# Patient Record
Sex: Male | Born: 1955 | Race: White | Hispanic: No | Marital: Single | Smoking: Current every day smoker
Health system: Southern US, Community
[De-identification: ages and names within clinical notes are randomized; demographics above are authoritative.]

## PROBLEM LIST (undated history)

## (undated) DIAGNOSIS — I4891 Unspecified atrial fibrillation: Secondary | ICD-10-CM

## (undated) DIAGNOSIS — E876 Hypokalemia: Secondary | ICD-10-CM

## (undated) DIAGNOSIS — I10 Essential (primary) hypertension: Secondary | ICD-10-CM

## (undated) DIAGNOSIS — J449 Chronic obstructive pulmonary disease, unspecified: Secondary | ICD-10-CM

## (undated) DIAGNOSIS — E119 Type 2 diabetes mellitus without complications: Secondary | ICD-10-CM

## (undated) DIAGNOSIS — E871 Hypo-osmolality and hyponatremia: Secondary | ICD-10-CM

## (undated) DIAGNOSIS — I639 Cerebral infarction, unspecified: Secondary | ICD-10-CM

## (undated) HISTORY — PX: TONSILLECTOMY: SUR1361

## (undated) HISTORY — PX: APPENDECTOMY: SHX54

---

## 2017-01-27 ENCOUNTER — Emergency Department (HOSPITAL_COMMUNITY): Payer: Medicare Other

## 2017-01-27 ENCOUNTER — Encounter (HOSPITAL_COMMUNITY): Payer: Self-pay | Admitting: Emergency Medicine

## 2017-01-27 ENCOUNTER — Emergency Department (HOSPITAL_COMMUNITY)
Admission: EM | Admit: 2017-01-27 | Discharge: 2017-01-27 | Disposition: A | Discharge status: Home / Self Care | Payer: Medicare Other | Attending: Emergency Medicine | Admitting: Emergency Medicine

## 2017-01-27 DIAGNOSIS — Y939 Activity, unspecified: Secondary | ICD-10-CM | POA: Diagnosis not present

## 2017-01-27 DIAGNOSIS — Y92038 Other place in apartment as the place of occurrence of the external cause: Secondary | ICD-10-CM | POA: Insufficient documentation

## 2017-01-27 DIAGNOSIS — S42201A Unspecified fracture of upper end of right humerus, initial encounter for closed fracture: Secondary | ICD-10-CM | POA: Insufficient documentation

## 2017-01-27 DIAGNOSIS — T1590XA Foreign body on external eye, part unspecified, unspecified eye, initial encounter: Secondary | ICD-10-CM

## 2017-01-27 DIAGNOSIS — R51 Headache: Secondary | ICD-10-CM | POA: Insufficient documentation

## 2017-01-27 DIAGNOSIS — J449 Chronic obstructive pulmonary disease, unspecified: Secondary | ICD-10-CM | POA: Diagnosis not present

## 2017-01-27 DIAGNOSIS — Z23 Encounter for immunization: Secondary | ICD-10-CM | POA: Diagnosis not present

## 2017-01-27 DIAGNOSIS — I482 Chronic atrial fibrillation, unspecified: Secondary | ICD-10-CM

## 2017-01-27 DIAGNOSIS — F17200 Nicotine dependence, unspecified, uncomplicated: Secondary | ICD-10-CM | POA: Diagnosis not present

## 2017-01-27 DIAGNOSIS — I10 Essential (primary) hypertension: Secondary | ICD-10-CM | POA: Insufficient documentation

## 2017-01-27 DIAGNOSIS — E119 Type 2 diabetes mellitus without complications: Secondary | ICD-10-CM | POA: Diagnosis not present

## 2017-01-27 DIAGNOSIS — S4991XA Unspecified injury of right shoulder and upper arm, initial encounter: Secondary | ICD-10-CM | POA: Diagnosis present

## 2017-01-27 DIAGNOSIS — W010XXA Fall on same level from slipping, tripping and stumbling without subsequent striking against object, initial encounter: Secondary | ICD-10-CM | POA: Insufficient documentation

## 2017-01-27 DIAGNOSIS — Y999 Unspecified external cause status: Secondary | ICD-10-CM | POA: Insufficient documentation

## 2017-01-27 HISTORY — DX: Unspecified atrial fibrillation: I48.91

## 2017-01-27 HISTORY — DX: Essential (primary) hypertension: I10

## 2017-01-27 HISTORY — DX: Type 2 diabetes mellitus without complications: E11.9

## 2017-01-27 HISTORY — DX: Chronic obstructive pulmonary disease, unspecified: J44.9

## 2017-01-27 LAB — CBC WITH DIFFERENTIAL/PLATELET
BASOS ABS: 0 10*3/uL (ref 0.0–0.1)
Basophils Relative: 0 %
Eosinophils Absolute: 0 10*3/uL (ref 0.0–0.7)
Eosinophils Relative: 0 %
HCT: 34.6 % — ABNORMAL LOW (ref 39.0–52.0)
HEMOGLOBIN: 11.2 g/dL — AB (ref 13.0–17.0)
LYMPHS ABS: 1.2 10*3/uL (ref 0.7–4.0)
LYMPHS PCT: 9 %
MCH: 27.9 pg (ref 26.0–34.0)
MCHC: 32.4 g/dL (ref 30.0–36.0)
MCV: 86.3 fL (ref 78.0–100.0)
Monocytes Absolute: 0.6 10*3/uL (ref 0.1–1.0)
Monocytes Relative: 4 %
NEUTROS ABS: 12.2 10*3/uL — AB (ref 1.7–7.7)
NEUTROS PCT: 87 %
Platelets: 228 10*3/uL (ref 150–400)
RBC: 4.01 MIL/uL — AB (ref 4.22–5.81)
RDW: 16.2 % — ABNORMAL HIGH (ref 11.5–15.5)
WBC: 14.1 10*3/uL — AB (ref 4.0–10.5)

## 2017-01-27 LAB — I-STAT CHEM 8, ED
BUN: 10 mg/dL (ref 6–20)
CALCIUM ION: 1.05 mmol/L — AB (ref 1.15–1.40)
Chloride: 96 mmol/L — ABNORMAL LOW (ref 101–111)
Creatinine, Ser: 0.7 mg/dL (ref 0.61–1.24)
GLUCOSE: 119 mg/dL — AB (ref 65–99)
HCT: 37 % — ABNORMAL LOW (ref 39.0–52.0)
Hemoglobin: 12.6 g/dL — ABNORMAL LOW (ref 13.0–17.0)
Potassium: 4 mmol/L (ref 3.5–5.1)
SODIUM: 128 mmol/L — AB (ref 135–145)
TCO2: 21 mmol/L — AB (ref 22–32)

## 2017-01-27 LAB — URINALYSIS, ROUTINE W REFLEX MICROSCOPIC
Bilirubin Urine: NEGATIVE
Glucose, UA: NEGATIVE mg/dL
Hgb urine dipstick: NEGATIVE
Ketones, ur: 20 mg/dL — AB
Leukocytes, UA: NEGATIVE
NITRITE: NEGATIVE
Protein, ur: 30 mg/dL — AB
SPECIFIC GRAVITY, URINE: 1.02 (ref 1.005–1.030)
SQUAMOUS EPITHELIAL / LPF: NONE SEEN
pH: 5 (ref 5.0–8.0)

## 2017-01-27 LAB — ETHANOL: Alcohol, Ethyl (B): <10 mg/dL (ref <10)

## 2017-01-27 LAB — CK: Total CK: 94 U/L (ref 49–397)

## 2017-01-27 MED ORDER — SODIUM CHLORIDE 0.9 % IV BOLUS (SEPSIS)
1000.0000 mL | Freq: Once | INTRAVENOUS | Status: AC
  Administered 2017-01-27: 1000 mL via INTRAVENOUS

## 2017-01-27 MED ORDER — MORPHINE SULFATE (PF) 4 MG/ML IV SOLN
4.0000 mg | Freq: Once | INTRAVENOUS | Status: AC
  Administered 2017-01-27: 4 mg via INTRAVENOUS
  Filled 2017-01-27: qty 1

## 2017-01-27 MED ORDER — TETANUS-DIPHTH-ACELL PERTUSSIS 5-2.5-18.5 LF-MCG/0.5 IM SUSP
0.5000 mL | Freq: Once | INTRAMUSCULAR | Status: AC
  Administered 2017-01-27: 0.5 mL via INTRAMUSCULAR
  Filled 2017-01-27: qty 0.5

## 2017-01-27 NOTE — ED Notes (Signed)
Pt ambulated from room to restroom with no assistance.

## 2017-01-27 NOTE — Discharge Instructions (Signed)
You have been evaluated for your fall.  Your xray revealed that you have broken your right upper arm/shoulder.  Please wear sling and also follow up promptly with orthopedist Dr. Shon Baton next week. You also have a history of having atrial fibrillation.  It is important that you follow up with the afib clinic for further care.  Call and schedule an appoint next week.  Take your home pain medication as needed for pain.  Return if you have any concerns.

## 2017-01-27 NOTE — ED Provider Notes (Signed)
[facility]-EMERGENCY DEPT Provider Note   CSN: [id number] Arrival date & time: 01/27/17  0603     History   Chief Complaint Chief Complaint  Patient presents with  . Fall  . Shoulder Injury    HPI Stuart Mendoza is a 61 y.o. male.  HPI   61 year old male with history of diabetes, COPD, atrial fibrillation, and currently not on any blood thinner medication presenting for evaluation of a fall. Patient states last night he was walking towards his bedroom around midnight go to sleep when he lost balance, fell backward and struck his right shoulder against the wall. He report acute onset of sharp pain to his right shoulder. He fell onto the ground and was unable to get up due to pain. Patient states he may have been on the ground for about an hour. He was found able to get the attention of his roommate who called EMS and patient brought here for further evaluation. Patient currently only complaining of pain to his right shoulder which is nonradiating but worse with movement. He denies any significant headache, neck pain, lightheadedness, dizziness, chest pain, shortness of breath, abdominal pain, low back pain, hip pain or pain to his other extremities. He does suffer a scrape on his right thigh but unable to recall last tetanus status. Per EMS note, patient admits to drinking 240 ounce beers last night and he was laying on the full 1 hour or 2 before EMS was contacted. In the note also mentioned that patient took hydrocodone for the EMS but denies taking any thing. Roommate states that patient has an alcohol problem and falls often. I did specifically ask if patient had consuming alcohol and patient denies. He also denies having recurrent falls and states that his last fall was several years ago. He also mentioned his back alcohol consumption was several days ago. He does have history of atrial fibrillation but currently not taking any blood thinning medication for the past several weeks due to  excessive bruising.  Past Medical History:  Diagnosis Date  . Atrial fibrillation (HCC)   . COPD (chronic obstructive pulmonary disease) (HCC)   . Diabetes mellitus without complication (HCC)   . Hypertension     There are no active problems to display for this patient.   Past Surgical History:  Procedure Laterality Date  . APPENDECTOMY    . TONSILLECTOMY         Home Medications    Prior to Admission medications   Not on File    Family History No family history on file.  Social History Social History  Substance Use Topics  . Smoking status: Current Every Day Smoker  . Smokeless tobacco: Never Used  . Alcohol use Yes     Allergies   Metformin and related   Review of Systems Review of Systems  All other systems reviewed and are negative.    Physical Exam Updated Vital Signs BP (!) 158/113 (BP Location: Left Arm)   Pulse 89   Temp 97.6 F (36.4 C) (Oral)   Resp 20   SpO2 96%   Physical Exam  Constitutional: He is oriented to person, place, and time. He appears well-developed and well-nourished. No distress.  HENT:  Head: Normocephalic and atraumatic.  Scalp is nontender, no hemotympanum, no septal hematoma, no malocclusion, no midface tenderness, no battle sign, no raccoon's eyes  Eyes: Pupils are equal, round, and reactive to light. Conjunctivae and EOM are normal.  Neck: Normal range of motion. Neck supple.  No  cervical midline spine tenderness crepitus or step-off  Cardiovascular:  Irregularly irregular heart rate/rhythm  Pulmonary/Chest: Effort normal and breath sounds normal. No respiratory distress. He has no wheezes.  Abdominal: Soft. Bowel sounds are normal. He exhibits no distension. There is no tenderness.  Musculoskeletal: He exhibits tenderness (Right shoulder: Exquisite tenderness to palpation of the lateral deltoid and the proximal humeral region on palpation with associated swelling and decreased range of motion and crepitus.).    Right elbow was nontender, right wrist nontender with intact radial pulse and normal grip strength.  Neurological: He is alert and oriented to person, place, and time.  Skin: No rash noted.  Psychiatric: He has a normal mood and affect.  Nursing note and vitals reviewed.    ED Treatments / Results  Labs (all labs ordered are listed, but only abnormal results are displayed) Labs Reviewed  URINALYSIS, ROUTINE W REFLEX MICROSCOPIC - Abnormal; Notable for the following:       Result Value   Ketones, ur 20 (*)    Protein, ur 30 (*)    Bacteria, UA RARE (*)    All other components within normal limits  CBC WITH DIFFERENTIAL/PLATELET - Abnormal; Notable for the following:    WBC 14.1 (*)    RBC 4.01 (*)    Hemoglobin 11.2 (*)    HCT 34.6 (*)    RDW 16.2 (*)    Neutro Abs 12.2 (*)    All other components within normal limits  I-STAT CHEM 8, ED - Abnormal; Notable for the following:    Sodium 128 (*)    Chloride 96 (*)    Glucose, Bld 119 (*)    Calcium, Ion 1.05 (*)    TCO2 21 (*)    Hemoglobin 12.6 (*)    HCT 37.0 (*)    All other components within normal limits  CK  ETHANOL    EKG  EKG Interpretation  Date/Time:  Friday January 27 2017 07:48:26 EDT Ventricular Rate:  117 PR Interval:    QRS Duration: 105 QT Interval:  372 QTC Calculation: 471 R Axis:   72 Text Interpretation:  Atrial fibrillation Premature ventricular complexes No old tracing to compare Confirmed by Mancel Bale [phone]) on 01/27/2017 1:42:01 PM       Radiology Dg Shoulder Right  Result Date: 01/27/2017 CLINICAL DATA:  Recent fall with shoulder pain, initial encounter EXAM: RIGHT SHOULDER - 2+ VIEW COMPARISON:  None. FINDINGS: There is a mildly impacted fracture with comminution in the proximal right humerus. It appears to involve primarily the surgical neck. Degenerative changes of the acromioclavicular joint are seen. No other focal abnormality is noted. IMPRESSION: Comminuted proximal right  humeral fracture with impaction at the fracture site. Electronically Signed   By: Alcide Clever M.D.   On: 01/27/2017 07:20   Ct Head Wo Contrast  Result Date: 01/27/2017 CLINICAL DATA:  Intermittent frontal headaches. EXAM: CT HEAD WITHOUT CONTRAST TECHNIQUE: Contiguous axial images were obtained from the base of the skull through the vertex without intravenous contrast. COMPARISON:  None. FINDINGS: Brain: There are bilateral large old frontal infarct with encephalomalacia. Diffuse chronic small vessel disease throughout the deep white matter elsewhere. Mild age related volume loss. No acute intracranial abnormality. Specifically, no hemorrhage, hydrocephalus, mass lesion, acute infarction, or significant intracranial injury. Vascular: No hyperdense vessel or unexpected calcification. Skull: No acute calvarial abnormality. Sinuses/Orbits: Visualized paranasal sinuses and mastoids clear. Orbital soft tissues unremarkable. 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 Atrophy, chronic small vessel  disease. Electronically Signed   By: Charlett Nose M.D.   On: 01/27/2017 10:06   Mr Brain Wo Contrast  Result Date: 01/27/2017 CLINICAL DATA:  Ataxia, suspect stroke EXAM: MRI HEAD WITHOUT CONTRAST TECHNIQUE: Multiplanar, multiecho pulse sequences of the brain and surrounding structures were obtained without intravenous contrast. COMPARISON:  CT 01/27/2017 FINDINGS: Brain: Image quality degraded by moderate motion Negative for acute infarct. Chronic encephalomalacia in the frontal lobe bilaterally is relatively symmetric suggesting prior head trauma. Mild chronic hemorrhage is present in the right frontal lobe. Negative for mass or edema. Enlargement of the frontal horns due to frontal lobe encephalomalacia. Moderate chronic white matter changes. Patchy hyperintensity in the pons. 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 Vascular: Normal arterial flow voids Skull and upper cervical  spine: Negative Sinuses/Orbits: Negative Other: None IMPRESSION: Negative for acute infarct. Chronic microvascular ischemic change in the white matter. 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 Electronically Signed   By: Marlan Palau M.D.   On: 01/27/2017 12:52   Dg Humerus Right  Result Date: 01/27/2017 CLINICAL DATA:  Recent fall with right shoulder pain, initial encounter EXAM: RIGHT HUMERUS - 2+ VIEW COMPARISON:  None. FINDINGS: Comminuted fracture the proximal right humerus is again noted with impaction at the fracture site. The distal humerus and visualized proximal forearm are within normal limits. IMPRESSION: Proximal humeral fracture. Electronically Signed   By: Alcide Clever M.D.   On: 01/27/2017 07:21    Procedures Procedures (including critical care time)  Medications Ordered in ED Medications  Tdap (BOOSTRIX) injection 0.5 mL (0.5 mLs Intramuscular Given 01/27/17 0817)  morphine 4 MG/ML injection 4 mg (4 mg Intravenous Given 01/27/17 0817)  sodium chloride 0.9 % bolus 1,000 mL (1,000 mLs Intravenous New Bag/Given 01/27/17 0817)  morphine 4 MG/ML injection 4 mg (4 mg Intravenous Given 01/27/17 0926)     Initial Impression / Assessment and Plan / ED Course  I have reviewed the triage vital signs and the nursing notes.  Pertinent labs & imaging results that were available during my care of the patient were reviewed by me and considered in my medical decision making (see chart for details).     BP (!) 182/108 (BP Location: Left Arm)   Pulse 90   Temp 97.6 F (36.4 C) (Oral)   Resp 16   SpO2 96%  The patient was noted to be hypertensive today in the emergency department. I have spoken with the patient regarding hypertension and the need for improved management. I instructed the patient to followup with the Primary care doctor within 4 days to improve the management of the patient's hypertension. I also  counseled the patient regarding the signs and symptoms which would require an emergent visit to an emergency department for hypertensive urgency and/or hypertensive emergency. The patient understood the need for improved hypertensive management.   Final Clinical Impressions(s) / ED Diagnoses   Final diagnoses:  Chronic atrial fibrillation (HCC)  Closed fracture of proximal end of right humerus, unspecified fracture morphology, initial encounter    New Prescriptions New Prescriptions   No medications on file   7:42 AM Patient fell last night injuring his right shoulder. X-ray demonstrate comminuted proximal right humeral fracture with impaction at the fracture site. This is a closed injury. Patient is neurovascularly intact. I am unsure if this is a mechanical fall versus having another inciting event however per EMS note, roommate states patient has recurrent falls and history of alcohol  abuse. Workup initiated including assessing EtOH, labs, EKG, orthostatic vital sign. Pain medication, sling and IV fluid given.  This patients CHA2DS2-VASc Score and unadjusted Ischemic Stroke Rate (% per year) is equal to 2.2 % stroke rate/year from a score of 2  Above score calculated as 1 point each if present [CHF, HTN, DM, Vascular=MI/PAD/Aortic Plaque, Age if 65-74, or Male] Above score calculated as 2 points each if present [Age > 75, or Stroke/TIA/TE]   9:15 AM Consider sending to afib clinic for his untreated afib.  1:48 PM CT scan of head and brain MRI without acute pathology. No acute stroke.  WBC mildly elevated at 14.1, likely 2/2 stress.  Hgb 11.2.  Mild hyponatremia of 128, but pt mentating well, no delirium.  Urine with evidence of UTI.  20 ketones were noted in urine, IVF given.  Pt now able to ambulate with no assistance.  Pt will d/c home with close f/u to orthopedic for his right proximal humeral fracture.  Pt will receive opiate pain medication.  In order to decrease risk of narcotic  abuse. Pt's record were checked using the [place] Controlled Substance database.  Pt will also f/u with the afib clinic.  No VTE prophylaxis or oral anticoagulant prescribe at this time as we will defer further care with afib clinic.     Fayrene Helper, PA-C 01/27/17 1402    Mancel Bale, MD 01/30/17 [phone]

## 2017-01-27 NOTE — ED Notes (Signed)
Patient transported to CT 

## 2017-01-27 NOTE — ED Triage Notes (Signed)
Pt brought in from home after he fell forward and caught himself with his right arm   Pt is c/o right upper arm and shoulder pain  Possible deformity  Sling applied by EMS  Pt admits to drinking tonight 2 40 oz beers  Pt states he laid on the floor for an hour or two prior to calling EMS  Pt took a hydrocodone in front of EMS then denied he took anything  Roommate states pt has an alcohol problem and falls often

## 2017-01-27 NOTE — ED Notes (Signed)
Delay on EKG. Just received report and EKG was not complete at the time.

## 2017-01-28 ENCOUNTER — Emergency Department (HOSPITAL_COMMUNITY): Payer: Medicare Other

## 2017-01-28 ENCOUNTER — Encounter (HOSPITAL_COMMUNITY): Payer: Self-pay | Admitting: Oncology

## 2017-01-28 ENCOUNTER — Encounter (HOSPITAL_COMMUNITY): Payer: Self-pay | Admitting: *Deleted

## 2017-01-28 ENCOUNTER — Emergency Department (HOSPITAL_COMMUNITY)
Admission: EM | Admit: 2017-01-28 | Discharge: 2017-01-28 | Disposition: A | Discharge status: Home / Self Care | Payer: Medicare Other | Source: Home / Self Care | Attending: Emergency Medicine | Admitting: Emergency Medicine

## 2017-01-28 ENCOUNTER — Inpatient Hospital Stay (HOSPITAL_COMMUNITY)
Admission: EM | Admit: 2017-01-28 | Discharge: 2017-02-02 | DRG: 897 | Disposition: A | Discharge status: Skilled Nursing Facility | Payer: Medicare Other | Attending: Internal Medicine | Admitting: Internal Medicine

## 2017-01-28 DIAGNOSIS — Y92007 Garden or yard of unspecified non-institutional (private) residence as the place of occurrence of the external cause: Secondary | ICD-10-CM

## 2017-01-28 DIAGNOSIS — Z6834 Body mass index (BMI) 34.0-34.9, adult: Secondary | ICD-10-CM | POA: Diagnosis not present

## 2017-01-28 DIAGNOSIS — I4891 Unspecified atrial fibrillation: Secondary | ICD-10-CM | POA: Diagnosis present

## 2017-01-28 DIAGNOSIS — I482 Chronic atrial fibrillation, unspecified: Secondary | ICD-10-CM

## 2017-01-28 DIAGNOSIS — S42211A Unspecified displaced fracture of surgical neck of right humerus, initial encounter for closed fracture: Secondary | ICD-10-CM | POA: Diagnosis present

## 2017-01-28 DIAGNOSIS — F1593 Other stimulant use, unspecified with withdrawal: Secondary | ICD-10-CM | POA: Diagnosis not present

## 2017-01-28 DIAGNOSIS — T148XXA Other injury of unspecified body region, initial encounter: Secondary | ICD-10-CM

## 2017-01-28 DIAGNOSIS — F10231 Alcohol dependence with withdrawal delirium: Secondary | ICD-10-CM | POA: Diagnosis present

## 2017-01-28 DIAGNOSIS — Z7984 Long term (current) use of oral hypoglycemic drugs: Secondary | ICD-10-CM

## 2017-01-28 DIAGNOSIS — W19XXXA Unspecified fall, initial encounter: Secondary | ICD-10-CM

## 2017-01-28 DIAGNOSIS — I480 Paroxysmal atrial fibrillation: Secondary | ICD-10-CM | POA: Diagnosis not present

## 2017-01-28 DIAGNOSIS — E669 Obesity, unspecified: Secondary | ICD-10-CM | POA: Diagnosis not present

## 2017-01-28 DIAGNOSIS — Z9119 Patient's noncompliance with other medical treatment and regimen: Secondary | ICD-10-CM | POA: Diagnosis not present

## 2017-01-28 DIAGNOSIS — F19939 Other psychoactive substance use, unspecified with withdrawal, unspecified: Secondary | ICD-10-CM | POA: Diagnosis present

## 2017-01-28 DIAGNOSIS — W010XXA Fall on same level from slipping, tripping and stumbling without subsequent striking against object, initial encounter: Secondary | ICD-10-CM | POA: Diagnosis not present

## 2017-01-28 DIAGNOSIS — J449 Chronic obstructive pulmonary disease, unspecified: Secondary | ICD-10-CM | POA: Diagnosis present

## 2017-01-28 DIAGNOSIS — I10 Essential (primary) hypertension: Secondary | ICD-10-CM | POA: Insufficient documentation

## 2017-01-28 DIAGNOSIS — X509XXD Other and unspecified overexertion or strenuous movements or postures, subsequent encounter: Secondary | ICD-10-CM | POA: Insufficient documentation

## 2017-01-28 DIAGNOSIS — F17200 Nicotine dependence, unspecified, uncomplicated: Secondary | ICD-10-CM

## 2017-01-28 DIAGNOSIS — E876 Hypokalemia: Secondary | ICD-10-CM | POA: Diagnosis present

## 2017-01-28 DIAGNOSIS — Y999 Unspecified external cause status: Secondary | ICD-10-CM | POA: Insufficient documentation

## 2017-01-28 DIAGNOSIS — G9341 Metabolic encephalopathy: Secondary | ICD-10-CM | POA: Diagnosis not present

## 2017-01-28 DIAGNOSIS — Z888 Allergy status to other drugs, medicaments and biological substances status: Secondary | ICD-10-CM

## 2017-01-28 DIAGNOSIS — G312 Degeneration of nervous system due to alcohol: Secondary | ICD-10-CM | POA: Diagnosis present

## 2017-01-28 DIAGNOSIS — E871 Hypo-osmolality and hyponatremia: Secondary | ICD-10-CM | POA: Diagnosis not present

## 2017-01-28 DIAGNOSIS — Z79899 Other long term (current) drug therapy: Secondary | ICD-10-CM

## 2017-01-28 DIAGNOSIS — E119 Type 2 diabetes mellitus without complications: Secondary | ICD-10-CM | POA: Insufficient documentation

## 2017-01-28 DIAGNOSIS — S42201D Unspecified fracture of upper end of right humerus, subsequent encounter for fracture with routine healing: Secondary | ICD-10-CM | POA: Insufficient documentation

## 2017-01-28 DIAGNOSIS — E1165 Type 2 diabetes mellitus with hyperglycemia: Secondary | ICD-10-CM | POA: Diagnosis present

## 2017-01-28 DIAGNOSIS — Z7901 Long term (current) use of anticoagulants: Secondary | ICD-10-CM | POA: Insufficient documentation

## 2017-01-28 DIAGNOSIS — F17210 Nicotine dependence, cigarettes, uncomplicated: Secondary | ICD-10-CM | POA: Diagnosis not present

## 2017-01-28 DIAGNOSIS — Y9389 Activity, other specified: Secondary | ICD-10-CM | POA: Insufficient documentation

## 2017-01-28 DIAGNOSIS — Z7982 Long term (current) use of aspirin: Secondary | ICD-10-CM

## 2017-01-28 DIAGNOSIS — S0083XA Contusion of other part of head, initial encounter: Secondary | ICD-10-CM

## 2017-01-28 LAB — CBC WITH DIFFERENTIAL/PLATELET
Basophils Absolute: 0 10*3/uL (ref 0.0–0.1)
Basophils Relative: 0 %
Eosinophils Absolute: 0 10*3/uL (ref 0.0–0.7)
Eosinophils Relative: 0 %
HCT: 34.2 % — ABNORMAL LOW (ref 39.0–52.0)
HEMOGLOBIN: 11 g/dL — AB (ref 13.0–17.0)
LYMPHS ABS: 1.1 10*3/uL (ref 0.7–4.0)
LYMPHS PCT: 9 %
MCH: 27.9 pg (ref 26.0–34.0)
MCHC: 32.2 g/dL (ref 30.0–36.0)
MCV: 86.8 fL (ref 78.0–100.0)
Monocytes Absolute: 0.9 10*3/uL (ref 0.1–1.0)
Monocytes Relative: 7 %
NEUTROS ABS: 10.2 10*3/uL — AB (ref 1.7–7.7)
NEUTROS PCT: 84 %
Platelets: 204 10*3/uL (ref 150–400)
RBC: 3.94 MIL/uL — AB (ref 4.22–5.81)
RDW: 16.6 % — ABNORMAL HIGH (ref 11.5–15.5)
WBC: 12.1 10*3/uL — AB (ref 4.0–10.5)

## 2017-01-28 LAB — PROTIME-INR
INR: 1.21
PROTHROMBIN TIME: 15.2 s (ref 11.4–15.2)

## 2017-01-28 LAB — COMPREHENSIVE METABOLIC PANEL
ALK PHOS: 96 U/L (ref 38–126)
ALT: 14 U/L — AB (ref 17–63)
AST: 20 U/L (ref 15–41)
Albumin: 4.2 g/dL (ref 3.5–5.0)
Anion gap: 12 (ref 5–15)
BUN: 11 mg/dL (ref 6–20)
CALCIUM: 9 mg/dL (ref 8.9–10.3)
CO2: 22 mmol/L (ref 22–32)
CREATININE: 0.78 mg/dL (ref 0.61–1.24)
Chloride: 95 mmol/L — ABNORMAL LOW (ref 101–111)
Glucose, Bld: 124 mg/dL — ABNORMAL HIGH (ref 65–99)
Potassium: 3.7 mmol/L (ref 3.5–5.1)
Sodium: 129 mmol/L — ABNORMAL LOW (ref 135–145)
Total Bilirubin: 1.6 mg/dL — ABNORMAL HIGH (ref 0.3–1.2)
Total Protein: 7.3 g/dL (ref 6.5–8.1)

## 2017-01-28 LAB — ETHANOL: Alcohol, Ethyl (B): <10 mg/dL (ref <10)

## 2017-01-28 MED ORDER — VITAMIN B-1 100 MG PO TABS
100.0000 mg | ORAL_TABLET | Freq: Every day | ORAL | Status: DC
  Administered 2017-01-28: 100 mg via ORAL
  Filled 2017-01-28: qty 1

## 2017-01-28 MED ORDER — ALBUTEROL SULFATE HFA 108 (90 BASE) MCG/ACT IN AERS: 2.0000 | INHALATION_SPRAY | RESPIRATORY_TRACT | Status: DC | PRN

## 2017-01-28 MED ORDER — ALBUTEROL SULFATE (2.5 MG/3ML) 0.083% IN NEBU: 2.5000 mg | INHALATION_SOLUTION | RESPIRATORY_TRACT | Status: DC | PRN

## 2017-01-28 MED ORDER — RIVAROXABAN 20 MG PO TABS
20.0000 mg | ORAL_TABLET | Freq: Every day | ORAL | Status: DC
  Administered 2017-01-28 – 2017-02-02 (×6): 20 mg via ORAL
  Filled 2017-01-28 (×6): qty 1

## 2017-01-28 MED ORDER — FOLIC ACID 1 MG PO TABS
1.0000 mg | ORAL_TABLET | Freq: Every day | ORAL | Status: DC
  Administered 2017-01-28 – 2017-02-02 (×6): 1 mg via ORAL
  Filled 2017-01-28 (×6): qty 1

## 2017-01-28 MED ORDER — ACETAMINOPHEN 325 MG PO TABS
650.0000 mg | ORAL_TABLET | Freq: Four times a day (QID) | ORAL | Status: DC | PRN
  Administered 2017-01-29 – 2017-02-01 (×3): 650 mg via ORAL
  Filled 2017-01-28 (×3): qty 2

## 2017-01-28 MED ORDER — ASPIRIN EC 81 MG PO TBEC
81.0000 mg | DELAYED_RELEASE_TABLET | Freq: Every day | ORAL | Status: DC
  Administered 2017-01-28 – 2017-02-02 (×6): 81 mg via ORAL
  Filled 2017-01-28 (×6): qty 1

## 2017-01-28 MED ORDER — AMLODIPINE BESYLATE 10 MG PO TABS
10.0000 mg | ORAL_TABLET | Freq: Every day | ORAL | Status: DC
  Administered 2017-01-28 – 2017-02-02 (×6): 10 mg via ORAL
  Filled 2017-01-28: qty 2
  Filled 2017-01-28 (×5): qty 1

## 2017-01-28 MED ORDER — LORAZEPAM 2 MG/ML IJ SOLN: 0.0000 mg | Freq: Two times a day (BID) | INTRAMUSCULAR | Status: DC

## 2017-01-28 MED ORDER — THIAMINE HCL 100 MG/ML IJ SOLN: 100.0000 mg | Freq: Once | INTRAMUSCULAR | Status: DC

## 2017-01-28 MED ORDER — LINAGLIPTIN 5 MG PO TABS
5.0000 mg | ORAL_TABLET | Freq: Every day | ORAL | Status: DC
  Administered 2017-01-29 – 2017-02-02 (×5): 5 mg via ORAL
  Filled 2017-01-28 (×5): qty 1

## 2017-01-28 MED ORDER — PANTOPRAZOLE SODIUM 40 MG PO TBEC
40.0000 mg | DELAYED_RELEASE_TABLET | Freq: Every day | ORAL | Status: DC
  Administered 2017-01-28 – 2017-02-02 (×6): 40 mg via ORAL
  Filled 2017-01-28 (×6): qty 1

## 2017-01-28 MED ORDER — VITAMIN B-1 100 MG PO TABS: 100.0000 mg | ORAL_TABLET | Freq: Every day | ORAL | Status: DC

## 2017-01-28 MED ORDER — LORAZEPAM 1 MG PO TABS
0.0000 mg | ORAL_TABLET | Freq: Four times a day (QID) | ORAL | Status: DC
Start: 2017-01-28 — End: 2017-01-28
  Administered 2017-01-28: 1 mg via ORAL
  Filled 2017-01-28: qty 1

## 2017-01-28 MED ORDER — HYDROCODONE-ACETAMINOPHEN 10-325 MG PO TABS: 1.0000 | ORAL_TABLET | Freq: Four times a day (QID) | ORAL | 0 refills | Status: DC | PRN

## 2017-01-28 MED ORDER — SODIUM CHLORIDE 0.9 % IV SOLN
INTRAVENOUS | Status: DC
  Administered 2017-01-28: 22:00:00 via INTRAVENOUS

## 2017-01-28 MED ORDER — ACETAMINOPHEN 650 MG RE SUPP: 650.0000 mg | Freq: Four times a day (QID) | RECTAL | Status: DC | PRN

## 2017-01-28 MED ORDER — FLUTICASONE PROPIONATE 50 MCG/ACT NA SUSP
2.0000 | Freq: Every day | NASAL | Status: DC
  Administered 2017-01-29 – 2017-02-02 (×5): 2 via NASAL
  Filled 2017-01-28: qty 16

## 2017-01-28 MED ORDER — HYDROCODONE-ACETAMINOPHEN 5-325 MG PO TABS
2.0000 | ORAL_TABLET | Freq: Once | ORAL | Status: AC
  Administered 2017-01-28: 2 via ORAL
  Filled 2017-01-28: qty 2

## 2017-01-28 MED ORDER — HYDROCODONE-ACETAMINOPHEN 10-325 MG PO TABS
1.0000 | ORAL_TABLET | Freq: Four times a day (QID) | ORAL | Status: DC | PRN
  Administered 2017-01-29 – 2017-02-02 (×15): 1 via ORAL
  Filled 2017-01-28 (×15): qty 1

## 2017-01-28 MED ORDER — THIAMINE HCL 100 MG/ML IJ SOLN: 100.0000 mg | Freq: Every day | INTRAMUSCULAR | Status: DC

## 2017-01-28 MED ORDER — LABETALOL HCL 100 MG PO TABS
100.0000 mg | ORAL_TABLET | Freq: Two times a day (BID) | ORAL | Status: DC
  Administered 2017-01-29 – 2017-02-02 (×11): 100 mg via ORAL
  Filled 2017-01-28 (×12): qty 1

## 2017-01-28 MED ORDER — LORAZEPAM 2 MG/ML IJ SOLN: 0.0000 mg | Freq: Four times a day (QID) | INTRAMUSCULAR | Status: DC

## 2017-01-28 MED ORDER — ONDANSETRON HCL 4 MG/2ML IJ SOLN: 4.0000 mg | Freq: Four times a day (QID) | INTRAMUSCULAR | Status: DC | PRN

## 2017-01-28 MED ORDER — LORAZEPAM 2 MG/ML IJ SOLN
2.0000 mg | Freq: Once | INTRAMUSCULAR | Status: AC
  Administered 2017-01-28: 2 mg via INTRAVENOUS
  Filled 2017-01-28: qty 1

## 2017-01-28 MED ORDER — LORAZEPAM 1 MG PO TABS
1.0000 mg | ORAL_TABLET | Freq: Four times a day (QID) | ORAL | Status: AC | PRN
  Administered 2017-01-29 – 2017-01-31 (×5): 1 mg via ORAL
  Filled 2017-01-28 (×5): qty 1

## 2017-01-28 MED ORDER — LORAZEPAM 1 MG PO TABS: 0.0000 mg | ORAL_TABLET | Freq: Two times a day (BID) | ORAL | Status: DC

## 2017-01-28 MED ORDER — SODIUM CHLORIDE 0.9 % IV BOLUS (SEPSIS): 1000.0000 mL | Freq: Once | INTRAVENOUS | Status: DC

## 2017-01-28 MED ORDER — TIOTROPIUM BROMIDE MONOHYDRATE 18 MCG IN CAPS
18.0000 ug | ORAL_CAPSULE | Freq: Every day | RESPIRATORY_TRACT | Status: DC
  Administered 2017-01-30 – 2017-02-02 (×4): 18 ug via RESPIRATORY_TRACT
  Filled 2017-01-28 (×2): qty 5

## 2017-01-28 MED ORDER — LORAZEPAM 2 MG/ML IJ SOLN
1.0000 mg | Freq: Four times a day (QID) | INTRAMUSCULAR | Status: AC | PRN
  Administered 2017-01-30: 1 mg via INTRAVENOUS
  Filled 2017-01-28: qty 1

## 2017-01-28 MED ORDER — ADULT MULTIVITAMIN W/MINERALS CH
1.0000 | ORAL_TABLET | Freq: Every day | ORAL | Status: DC
  Administered 2017-01-28 – 2017-02-02 (×6): 1 via ORAL
  Filled 2017-01-28 (×6): qty 1

## 2017-01-28 MED ORDER — ONDANSETRON HCL 4 MG PO TABS: 4.0000 mg | ORAL_TABLET | Freq: Four times a day (QID) | ORAL | Status: DC | PRN

## 2017-01-28 MED ORDER — HYDRALAZINE HCL 20 MG/ML IJ SOLN: 10.0000 mg | Freq: Four times a day (QID) | INTRAMUSCULAR | Status: DC | PRN

## 2017-01-28 MED ORDER — LISINOPRIL 20 MG PO TABS
20.0000 mg | ORAL_TABLET | Freq: Every day | ORAL | Status: DC
  Administered 2017-01-28 – 2017-02-02 (×6): 20 mg via ORAL
  Filled 2017-01-28 (×6): qty 1
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
## 2017-01-28 NOTE — ED Provider Notes (Signed)
[facility]-EMERGENCY DEPT Provider Note   CSN: [id number] Arrival date & time: 01/28/17  0014     History   Chief Complaint Chief Complaint  Patient presents with  . Shoulder Pain    HPI Stuart Mendoza is a 61 y.o. male who presents with right shoulder pain. PMH significant for alocholism, frequent falls, A.fib on Xarelto, HTN, COPD, DM. He originally sustained a closed comminuted proximal humerus fracture yesterday. He had an extensive work up. He states that he was going to take his dog outside and when he opened the door there was another dog that was "6 ft tall" and so he tried to back up and tripped and fell on to his buttocks. He is unsure if he outstretched his hand or not. Since then he has had worsening right shoulder pain. He was on the floor for about 2-3 hours when his roommate came home and called EMS. He endorses drinking two 10oz Bud Ice's tonight. He denies taking any pain medicine. He states he took his sling off because it made his arm hurt more. He denies any head injury, LOC, chest pain, SOB, abdominal pain, other extremity pain. He takes Norco  for pain daily.  HPI  Past Medical History:  Diagnosis Date  . Atrial fibrillation (HCC)   . COPD (chronic obstructive pulmonary disease) (HCC)   . Diabetes mellitus without complication (HCC)   . Hypertension     There are no active problems to display for this patient.   Past Surgical History:  Procedure Laterality Date  . APPENDECTOMY    . TONSILLECTOMY       Home Medications    Prior to Admission medications   Medication Sig Start Date End Date Taking? Authorizing Provider  amLODipine (NORVASC) 10 MG tablet Take 10 mg by mouth daily. 12/22/16   [provider]  aspirin EC 81 MG tablet Take 81 mg by mouth daily.    [provider]  fluticasone (FLONASE) 50 MCG/ACT nasal spray Place 2 sprays into both nostrils daily. 01/17/17   [provider]  HYDROcodone-acetaminophen (NORCO) 10-325  MG tablet Take 1 tablet by mouth 4 (four) times daily. 12/25/16   [provider]  JANUVIA 100 MG tablet Take 100 mg by mouth daily. 12/23/16   [provider]  lisinopril (PRINIVIL,ZESTRIL) 20 MG tablet Take 20 mg by mouth daily. 12/22/16   [provider]  omeprazole (PRILOSEC) 40 MG capsule Take 40 mg by mouth daily. 12/22/16   [provider]  PROAIR HFA 108 (90 Base) MCG/ACT inhaler Place 2 puffs into alternate nostrils every 4 (four) hours as needed for wheezing or shortness of breath. 12/22/16   [provider]  SPIRIVA HANDIHALER 18 MCG inhalation capsule Place 18 mcg into inhaler and inhale daily. 12/23/16   [provider]  XARELTO 20 MG TABS tablet Take 20 mg by mouth daily. 12/26/16   [provider]    Family History No family history on file.  Social History Social History  Substance Use Topics  . Smoking status: Current Every Day Smoker  . Smokeless tobacco: Never Used  . Alcohol use Yes     Allergies   Metformin and related   Review of Systems Review of Systems  Respiratory: Negative for shortness of breath.   Cardiovascular: Negative for chest pain.  Gastrointestinal: Negative for abdominal pain.  Musculoskeletal: Positive for arthralgias and myalgias. Negative for gait problem.  Skin: Negative for wound.  Neurological: Negative for syncope.  Physical Exam Updated Vital Signs BP (!) 168/105 (BP Location: Left Arm)   Pulse 63   Temp (!) 97.3 F (36.3 C) (Oral)   Resp 19   SpO2 96%   Physical Exam  Constitutional: He is oriented to person, place, and time. He appears well-developed and well-nourished. No distress.  Calm, cooperative  HENT:  Head: Normocephalic and atraumatic.  Eyes: Pupils are equal, round, and reactive to light. Conjunctivae are normal. Right eye exhibits no discharge. Left eye exhibits no discharge. No scleral icterus.  Neck: Normal range of motion.  Cardiovascular: Normal  rate.  An irregularly irregular rhythm present.  Pulmonary/Chest: Effort normal and breath sounds normal. No respiratory distress. He has no wheezes. He exhibits no tenderness.  Abdominal: Soft. Bowel sounds are normal. He exhibits no distension. There is no tenderness.  Large abdominal surgical scar  Musculoskeletal:  Right arm: Moderate-severe swelling of shoulder. ROM deferred. Normal ROM of elbow and wrist. 2+ radial pulse.  Neurological: He is alert and oriented to person, place, and time.  Skin: Skin is warm and dry.  Psychiatric: He has a normal mood and affect. His behavior is normal.  Nursing note and vitals reviewed.    ED Treatments / Results  Labs (all labs ordered are listed, but only abnormal results are displayed) Labs Reviewed - No data to display  EKG  EKG Interpretation None       Radiology Dg Shoulder Right  Result Date: 01/28/2017 CLINICAL DATA:  Known right comminuted proximal humerus fracture with impaction sustained several days ago. Patient fell again this evening causing right shoulder to hurt. EXAM: RIGHT SHOULDER - 2+ VIEW COMPARISON:  Right shoulder and humerus radiographs from 1 day prior. FINDINGS: Re- demonstration of a comminuted impacted surgical neck fracture of the humerus with comminution. Slightly more apparent fracture undermining the greater tuberosity is seen on current exam fell also present in retrospect from the prior humerus radiographs. No joint dislocation. AC joint is maintained. The adjacent ribs and lung are nonacute. IMPRESSION: Comminuted surgical neck fracture with comminution, stable in appearance. Fracture undermining the greater tuberosity is again seen. No new fracture or joint dislocations. Electronically Signed   By: Tollie Eth M.D.   On: 01/28/2017 01:32   Dg Shoulder Right  Result Date: 01/27/2017 CLINICAL DATA:  Recent fall with shoulder pain, initial encounter EXAM: RIGHT SHOULDER - 2+ VIEW COMPARISON:  None. FINDINGS:  There is a mildly impacted fracture with comminution in the proximal right humerus. It appears to involve primarily the surgical neck. Degenerative changes of the acromioclavicular joint are seen. No other focal abnormality is noted. IMPRESSION: Comminuted proximal right humeral fracture with impaction at the fracture site. Electronically Signed   By: Alcide Clever M.D.   On: 01/27/2017 07:20   Ct Head Wo Contrast  Result Date: 01/27/2017 CLINICAL DATA:  Intermittent frontal headaches. EXAM: CT HEAD WITHOUT CONTRAST TECHNIQUE: Contiguous axial images were obtained from the base of the skull through the vertex without intravenous contrast. COMPARISON:  None. FINDINGS: Brain: There are bilateral large old frontal infarct with encephalomalacia. Diffuse chronic small vessel disease throughout the deep white matter elsewhere. Mild age related volume loss. No acute intracranial abnormality. Specifically, no hemorrhage, hydrocephalus, mass lesion, acute infarction, or significant intracranial injury. Vascular: No hyperdense vessel or unexpected calcification. Skull: No acute calvarial abnormality. Sinuses/Orbits: Visualized paranasal sinuses and mastoids clear. Orbital soft tissues unremarkable. 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 Atrophy, chronic small vessel disease. Electronically Signed  By: Charlett Nose M.D.   On: 01/27/2017 10:06   Mr Brain Wo Contrast  Result Date: 01/27/2017 CLINICAL DATA:  Ataxia, suspect stroke EXAM: MRI HEAD WITHOUT CONTRAST TECHNIQUE: Multiplanar, multiecho pulse sequences of the brain and surrounding structures were obtained without intravenous contrast. COMPARISON:  CT 01/27/2017 FINDINGS: Brain: Image quality degraded by moderate motion Negative for acute infarct. Chronic encephalomalacia in the frontal lobe bilaterally is relatively symmetric suggesting prior head trauma. Mild chronic hemorrhage is present in the right frontal lobe. Negative  for mass or edema. Enlargement of the frontal horns due to frontal lobe encephalomalacia. Moderate chronic white matter changes. Patchy hyperintensity in the pons. 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 Vascular: Normal arterial flow voids Skull and upper cervical spine: Negative Sinuses/Orbits: Negative Other: None IMPRESSION: Negative for acute infarct. Chronic microvascular ischemic change in the white matter. 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 Electronically Signed   By: Marlan Palau M.D.   On: 01/27/2017 12:52   Dg Humerus Right  Result Date: 01/27/2017 CLINICAL DATA:  Recent fall with right shoulder pain, initial encounter EXAM: RIGHT HUMERUS - 2+ VIEW COMPARISON:  None. FINDINGS: Comminuted fracture the proximal right humerus is again noted with impaction at the fracture site. The distal humerus and visualized proximal forearm are within normal limits. IMPRESSION: Proximal humeral fracture. Electronically Signed   By: Alcide Clever M.D.   On: 01/27/2017 07:21    Procedures Procedures (including critical care time)  Medications Ordered in ED Medications  HYDROcodone-acetaminophen (NORCO/VICODIN) 5-325 MG per tablet 2 tablet (2 tablets Oral Given 01/28/17 0106)     Initial Impression / Assessment and Plan / ED Course  I have reviewed the triage vital signs and the nursing notes.  Pertinent labs & imaging results that were available during my care of the patient were reviewed by me and considered in my medical decision making (see chart for details).  61 year old male with worsening right shoulder pain s/p fall earlier this evening. He is hypertensive but otherwise vitals are normal. Repeat Xray tonight did not show any acute abnormality. He has a hx of frequent falls which is likely due to his alcoholism and pain med use. He also seems relatively non-compliant. He took the sling off because it hurt more.  Also unclear if he was discharged with any pain medicine. [place] database checked which did not show a recent rx for Norco. Although this may increase his risk for more falls, he also has a significant fracture. Will rx small amt of Norco and encouraged pt to follow up with Orthopedics and PCP regarding his A.fib and uncontrolled BP.   I reviewed xray result with him. He states he doesn't want to go because he's "not stabilized". I advised him to follow up with Ortho and his PCP. I also advised him to wear his arm sling. He is at high risk for return.  Final Clinical Impressions(s) / ED Diagnoses   Final diagnoses:  Closed traumatic nondisplaced fracture of proximal end of right humerus with routine healing, subsequent encounter  Fall, initial encounter  Hypertension, unspecified type  Chronic atrial fibrillation [facility])    New Prescriptions New Prescriptions   No medications on file     Beryle Quant 01/28/17 0710    Loren Racer, MD 01/30/17 [phone]

## 2017-01-28 NOTE — ED Notes (Signed)
2 failed attempts to collect urine.

## 2017-01-28 NOTE — Discharge Instructions (Addendum)
Please follow up with Orthopedics and your doctor Wear sling at all times except when showering Take pain medicine as prescribed
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
## 2017-01-28 NOTE — ED Notes (Signed)
Pt states he doesn't want tot go home, he thinks he left too early on Monday
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
## 2017-01-28 NOTE — ED Notes (Signed)
Bed: [facility] Expected date:  Expected time:  Means of arrival:  Comments: Fall shoulder injury

## 2017-01-28 NOTE — ED Notes (Signed)
3rd failed attempt to collect urine sample

## 2017-01-29 DIAGNOSIS — G9341 Metabolic encephalopathy: Secondary | ICD-10-CM | POA: Diagnosis not present

## 2017-01-29 DIAGNOSIS — F10231 Alcohol dependence with withdrawal delirium: Secondary | ICD-10-CM | POA: Diagnosis not present

## 2017-01-29 LAB — CBC
HCT: 29.4 % — ABNORMAL LOW (ref 39.0–52.0)
Hemoglobin: 9.5 g/dL — ABNORMAL LOW (ref 13.0–17.0)
MCH: 28 pg (ref 26.0–34.0)
MCHC: 32.3 g/dL (ref 30.0–36.0)
MCV: 86.7 fL (ref 78.0–100.0)
PLATELETS: 190 10*3/uL (ref 150–400)
RBC: 3.39 MIL/uL — AB (ref 4.22–5.81)
RDW: 16.7 % — ABNORMAL HIGH (ref 11.5–15.5)
WBC: 9.1 10*3/uL (ref 4.0–10.5)

## 2017-01-29 LAB — BASIC METABOLIC PANEL
ANION GAP: 11 (ref 5–15)
BUN: 7 mg/dL (ref 6–20)
CALCIUM: 8.7 mg/dL — AB (ref 8.9–10.3)
CHLORIDE: 96 mmol/L — AB (ref 101–111)
CO2: 25 mmol/L (ref 22–32)
CREATININE: 0.66 mg/dL (ref 0.61–1.24)
GFR calc non Af Amer: >60 mL/min (ref >60)
GLUCOSE: 106 mg/dL — AB (ref 65–99)
Potassium: 3.1 mmol/L — ABNORMAL LOW (ref 3.5–5.1)
Sodium: 132 mmol/L — ABNORMAL LOW (ref 135–145)

## 2017-01-29 LAB — GLUCOSE, CAPILLARY: GLUCOSE-CAPILLARY: 114 mg/dL — AB (ref 65–99)

## 2017-01-29 LAB — RAPID URINE DRUG SCREEN, HOSP PERFORMED
AMPHETAMINES: NOT DETECTED
Barbiturates: NOT DETECTED
Benzodiazepines: POSITIVE — AB
Cocaine: NOT DETECTED
OPIATES: POSITIVE — AB
TETRAHYDROCANNABINOL: NOT DETECTED

## 2017-01-29 LAB — TSH: TSH: 0.652 u[IU]/mL (ref 0.350–4.500)

## 2017-01-29 LAB — HIV ANTIBODY (ROUTINE TESTING W REFLEX): HIV Screen 4th Generation wRfx: NONREACTIVE

## 2017-01-29 MED ORDER — POTASSIUM CHLORIDE CRYS ER 20 MEQ PO TBCR
40.0000 meq | EXTENDED_RELEASE_TABLET | Freq: Once | ORAL | Status: AC
  Administered 2017-01-29: 40 meq via ORAL
  Filled 2017-01-29: qty 2

## 2017-01-29 NOTE — Progress Notes (Addendum)
PT Cancellation Note  Patient Details Name: Stuart Mendoza MRN: [id number] DOB: 1956/02/10   Cancelled Treatment:    Reason Eval/Treat Not Completed: Patient declined, no reason specified. Pt stated he wanted to rest.  He adamantly refused.  Sling not on arm and he refused to allow PT to don this. Spoke with nursing and will check back later as schedule permits. Addendum: Returned 2 hours later and pt refused again stating, "It's the weekend.  I need to rest."  Will check back tomorrow.  [name] 01/29/2017, 1:21 PM

## 2017-01-29 NOTE — Progress Notes (Signed)
PROGRESS NOTE    Stuart Mendoza  ZOX:[id number] DOB: 1956-03-03 DOA: 01/28/2017 PCP: Patient, No Pcp Per    Brief Narrative:  61 y.o. male with medical history significant for alcohol abuse, hypertension, atrial fibrillation on xarelto. Due to altered mental status he is not a good historian. Per ED MD, pt came to ED 9/28 after he fell walking his dog, he was found to have a humerus fracture and he was discharged and his arm was placed in a sling. He apparently drank alcohol at home and had sustained another fall and came to ED for evaluation.   Assessment & Plan:   Principal Problem:   Acute metabolic encephalopathy - secondary to alcohol withdrawal  Atrial fibrillation - Chads score > 2 on xarelto - continue B blocker for rate control  Hyperglycemia/diabetes - Change diet to diabetic diet  Hypokalemia - will replace orally and reassess next am.  COPD - Currently compensated and stable continue current medication regimen  Active Problems:   Hyponatremia - mild and improving. Suspect continued improvement throughout the course of the day with improvement in oral intake    Accelerated hypertension - Patient is on amlodipine, labetalol, lisinopril   DVT prophylaxis: pt is on xarelto Code Status: Full Family Communication: None at bedside.  Disposition Plan: pending improvement in condition   Consultants:   None   Procedures: none   Antimicrobials: None   Subjective: Pt has no new complaints, no acute issues overnight. Pleasantly confused  Objective: Vitals:   01/28/17 2047 01/28/17 2121 01/29/17 0001 01/29/17 0611  BP:   (!) 159/104 (!) 137/94  Pulse: 68 94 100 (!) 113  Resp: Temp:  97.8 F (36.6 C) 98.5 F (36.9 C) 98.6 F (37 C)  TempSrc:  Axillary Oral Oral  SpO2: 98% 95% 96% 96%  Weight:    100.3 kg (221 lb 3.2 oz)  Height:        Intake/Output Summary (Last 24 hours) at 01/29/17 1031 Last data filed at 01/29/17 0618  Gross  per 24 hour  Intake                0 ml  Output              725 ml  Net             -725 ml   Filed Weights   01/28/17 0628 01/29/17 0611  Weight: 96.2 kg (212 lb) 100.3 kg (221 lb 3.2 oz)    Examination:  General exam: Pt in nad, alert and awake Respiratory system: Clear to auscultation. Respiratory effort normal. Cardiovascular system: S1 & S2 heard, RRR. No JVD, murmurs, rubs, gallops or clicks. No pedal edema. Gastrointestinal system: Abdomen is nondistended, soft and nontender. No organomegaly or masses felt. Normal bowel sounds heard. Central nervous system: Alert and oriented x2 to person, time but not place. No focal neurological deficits. Extremities: Symmetric 5 x 5 power. Skin: No rashes,or ulcers, has bruising on face near right eyebrow Psychiatry: Judgement and insight appear impaired. Mood & affect appropriate.     Data Reviewed: I have personally reviewed following labs and imaging studies  CBC:  Recent Labs Lab 01/27/17 0827 01/27/17 0840 01/28/17 0856 01/29/17 0703  WBC 14.1*  --  12.1* 9.1  NEUTROABS 12.2*  --  10.2*  --   HGB 11.2* 12.6* 11.0* 9.5*  HCT 34.6* 37.0* 34.2* 29.4*  MCV 86.3  --  86.8 86.7  PLT 228  --  204 190   Basic Metabolic Panel:  Recent Labs Lab 01/27/17 0840 01/28/17 0856 01/29/17 0703  NA 128* 129* 132*  K 4.0 3.7 3.1*  CL 96* 95* 96*  CO2  --  22 25  GLUCOSE 119* 124* 106*  BUN CREATININE 0.70 0.78 0.66  CALCIUM  --  9.0 8.7*   GFR: Estimated Creatinine Clearance: 113.6 mL/min (by C-G formula based on SCr of 0.66 mg/dL). Liver Function Tests:  Recent Labs Lab 01/28/17 0856  AST 20  ALT 14*  ALKPHOS 96  BILITOT 1.6*  PROT 7.3  ALBUMIN 4.2   No results for input(s): LIPASE, AMYLASE in the last 168 hours. No results for input(s): AMMONIA in the last 168 hours. Coagulation Profile:  Recent Labs Lab 01/28/17 0856  INR 1.21   Cardiac Enzymes:  Recent Labs Lab 01/27/17 0827  CKTOTAL 94    BNP (last 3 results) No results for input(s): PROBNP in the last 8760 hours. HbA1C: No results for input(s): HGBA1C in the last 72 hours. CBG:  Recent Labs Lab 01/29/17 0748  GLUCAP 114*   Lipid Profile: No results for input(s): CHOL, HDL, LDLCALC, TRIG, CHOLHDL, LDLDIRECT in the last 72 hours. Thyroid Function Tests:  Recent Labs  01/29/17 0703  TSH 0.652   Anemia Panel: No results for input(s): VITAMINB12, FOLATE, FERRITIN, TIBC, IRON, RETICCTPCT in the last 72 hours. Sepsis Labs: No results for input(s): PROCALCITON, LATICACIDVEN in the last 168 hours.  No results found for this or any previous visit (from the past 240 hour(s)).       Radiology Studies: Dg Shoulder Right  Result Date: 01/28/2017 CLINICAL DATA:  Known right comminuted proximal humerus fracture with impaction sustained several days ago. Patient fell again this evening causing right shoulder to hurt. EXAM: RIGHT SHOULDER - 2+ VIEW COMPARISON:  Right shoulder and humerus radiographs from 1 day prior. FINDINGS: Re- demonstration of a comminuted impacted surgical neck fracture of the humerus with comminution. Slightly more apparent fracture undermining the greater tuberosity is seen on current exam fell also present in retrospect from the prior humerus radiographs. No joint dislocation. AC joint is maintained. The adjacent ribs and lung are nonacute. IMPRESSION: Comminuted surgical neck fracture with comminution, stable in appearance. Fracture undermining the greater tuberosity is again seen. No new fracture or joint dislocations. Electronically Signed   By: Tollie Eth M.D.   On: 01/28/2017 01:32   Ct Head Wo Contrast  Result Date: 01/28/2017 CLINICAL DATA:  61 year old male with fall and injury. Frontal headache. EXAM: CT HEAD WITHOUT CONTRAST TECHNIQUE: Contiguous axial images were obtained from the base of the skull through the vertex without intravenous contrast. COMPARISON:  01/27/2017 CT and MR FINDINGS:  Brain: No evidence of acute infarction, hemorrhage, hydrocephalus, extra-axial collection or mass lesion/mass effect. Moderate bifrontal encephalomalacia and chronic small-vessel white matter ischemic changes again noted. Vascular: Intracranial atherosclerotic calcifications again noted. Skull: No acute abnormality Sinuses/Orbits: No acute abnormality Other: None IMPRESSION: 1. No evidence of acute intracranial abnormality 2. Moderate bifrontal encephalomalacia Electronically Signed   By: Harmon Pier M.D.   On: 01/28/2017 09:29   Mr Brain Wo Contrast  Result Date: 01/27/2017 CLINICAL DATA:  Ataxia, suspect stroke EXAM: MRI HEAD WITHOUT CONTRAST TECHNIQUE: Multiplanar, multiecho pulse sequences of the brain and surrounding structures were obtained without intravenous contrast. COMPARISON:  CT 01/27/2017 FINDINGS: Brain: Image quality degraded by moderate motion Negative for acute infarct. Chronic encephalomalacia in the frontal lobe bilaterally is relatively symmetric suggesting prior  head trauma. Mild chronic hemorrhage is present in the right frontal lobe. Negative for mass or edema. Enlargement of the frontal horns due to frontal lobe encephalomalacia. Moderate chronic white matter changes. Patchy hyperintensity in the pons. 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 Vascular: Normal arterial flow voids Skull and upper cervical spine: Negative Sinuses/Orbits: Negative Other: None IMPRESSION: Negative for acute infarct. Chronic microvascular ischemic change in the white matter. 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 Electronically Signed   By: Marlan Palau M.D.   On: 01/27/2017 12:52        Scheduled Meds: . amLODipine  10 mg Oral Daily  . aspirin EC  81 mg Oral Daily  . fluticasone  2 spray Each Nare Daily  . folic acid  1 mg Oral Daily  . labetalol  100 mg Oral BID  . linagliptin  5 mg Oral Daily  . lisinopril  20 mg  Oral Daily  . multivitamin with minerals  1 tablet Oral Daily  . pantoprazole  40 mg Oral Daily  . rivaroxaban  20 mg Oral Daily  . tiotropium  18 mcg Inhalation Daily   Continuous Infusions: . sodium chloride 75 mL/hr at 01/28/17 2145     LOS: 1 day    Time spent: > 35 minutes  Penny Pia, MD [facility] Pager [phone]  If 7PM-7AM, please contact night-coverage [email or web address] Password [email or web address] 01/29/2017, 10:31 AM

## 2017-01-29 NOTE — Progress Notes (Signed)
This shift pt arrived to unit room 1518 via stretcher. Walked to bed w/ unsteady gait and 2 assist. VS taken. Alert to self. callbell within reach, bed alarm on. Pt lethargic. Initial assessment completed. Will continue to monitor pt

## 2017-01-30 DIAGNOSIS — S42211A Unspecified displaced fracture of surgical neck of right humerus, initial encounter for closed fracture: Secondary | ICD-10-CM | POA: Diagnosis not present

## 2017-01-30 DIAGNOSIS — G9341 Metabolic encephalopathy: Secondary | ICD-10-CM | POA: Diagnosis not present

## 2017-01-30 DIAGNOSIS — Z9119 Patient's noncompliance with other medical treatment and regimen: Secondary | ICD-10-CM | POA: Diagnosis not present

## 2017-01-30 DIAGNOSIS — Z7982 Long term (current) use of aspirin: Secondary | ICD-10-CM | POA: Diagnosis not present

## 2017-01-30 DIAGNOSIS — W010XXA Fall on same level from slipping, tripping and stumbling without subsequent striking against object, initial encounter: Secondary | ICD-10-CM | POA: Diagnosis not present

## 2017-01-30 DIAGNOSIS — I480 Paroxysmal atrial fibrillation: Secondary | ICD-10-CM | POA: Diagnosis not present

## 2017-01-30 DIAGNOSIS — E1165 Type 2 diabetes mellitus with hyperglycemia: Secondary | ICD-10-CM | POA: Diagnosis not present

## 2017-01-30 DIAGNOSIS — E876 Hypokalemia: Secondary | ICD-10-CM | POA: Diagnosis not present

## 2017-01-30 DIAGNOSIS — Z888 Allergy status to other drugs, medicaments and biological substances status: Secondary | ICD-10-CM | POA: Diagnosis not present

## 2017-01-30 DIAGNOSIS — W19XXXA Unspecified fall, initial encounter: Secondary | ICD-10-CM | POA: Diagnosis not present

## 2017-01-30 DIAGNOSIS — Z79899 Other long term (current) drug therapy: Secondary | ICD-10-CM | POA: Diagnosis not present

## 2017-01-30 DIAGNOSIS — Z7984 Long term (current) use of oral hypoglycemic drugs: Secondary | ICD-10-CM | POA: Diagnosis not present

## 2017-01-30 DIAGNOSIS — G312 Degeneration of nervous system due to alcohol: Secondary | ICD-10-CM | POA: Diagnosis not present

## 2017-01-30 DIAGNOSIS — J449 Chronic obstructive pulmonary disease, unspecified: Secondary | ICD-10-CM | POA: Diagnosis not present

## 2017-01-30 DIAGNOSIS — E871 Hypo-osmolality and hyponatremia: Secondary | ICD-10-CM | POA: Diagnosis not present

## 2017-01-30 DIAGNOSIS — F10231 Alcohol dependence with withdrawal delirium: Secondary | ICD-10-CM | POA: Diagnosis present

## 2017-01-30 DIAGNOSIS — Z7901 Long term (current) use of anticoagulants: Secondary | ICD-10-CM | POA: Diagnosis not present

## 2017-01-30 DIAGNOSIS — E669 Obesity, unspecified: Secondary | ICD-10-CM | POA: Diagnosis not present

## 2017-01-30 DIAGNOSIS — F17210 Nicotine dependence, cigarettes, uncomplicated: Secondary | ICD-10-CM | POA: Diagnosis not present

## 2017-01-30 DIAGNOSIS — Z6834 Body mass index (BMI) 34.0-34.9, adult: Secondary | ICD-10-CM | POA: Diagnosis not present

## 2017-01-30 DIAGNOSIS — I10 Essential (primary) hypertension: Secondary | ICD-10-CM | POA: Diagnosis not present

## 2017-01-30 LAB — BASIC METABOLIC PANEL
ANION GAP: 11 (ref 5–15)
BUN: 13 mg/dL (ref 6–20)
CHLORIDE: 99 mmol/L — AB (ref 101–111)
CO2: 22 mmol/L (ref 22–32)
Calcium: 8.6 mg/dL — ABNORMAL LOW (ref 8.9–10.3)
Creatinine, Ser: 0.75 mg/dL (ref 0.61–1.24)
GFR calc non Af Amer: >60 mL/min (ref >60)
Glucose, Bld: 139 mg/dL — ABNORMAL HIGH (ref 65–99)
POTASSIUM: 4.8 mmol/L (ref 3.5–5.1)
Sodium: 132 mmol/L — ABNORMAL LOW (ref 135–145)

## 2017-01-30 LAB — GLUCOSE, CAPILLARY: Glucose-Capillary: 137 mg/dL — ABNORMAL HIGH (ref 65–99)

## 2017-01-30 MED ORDER — CHLORHEXIDINE GLUCONATE 0.12 % MT SOLN
15.0000 mL | Freq: Two times a day (BID) | OROMUCOSAL | Status: DC
  Administered 2017-01-31 – 2017-02-02 (×6): 15 mL via OROMUCOSAL
  Filled 2017-01-30 (×4): qty 15

## 2017-01-30 MED ORDER — ORAL CARE MOUTH RINSE
15.0000 mL | Freq: Two times a day (BID) | OROMUCOSAL | Status: DC
  Administered 2017-02-01 – 2017-02-02 (×4): 15 mL via OROMUCOSAL

## 2017-01-30 MED ORDER — NICOTINE 21 MG/24HR TD PT24: 21.0000 mg | MEDICATED_PATCH | Freq: Every day | TRANSDERMAL | Status: DC

## 2017-01-30 MED ORDER — NICOTINE 14 MG/24HR TD PT24
14.0000 mg | MEDICATED_PATCH | Freq: Every day | TRANSDERMAL | Status: DC
  Administered 2017-01-30 – 2017-02-02 (×5): 14 mg via TRANSDERMAL
  Filled 2017-01-30 (×5): qty 1

## 2017-01-30 NOTE — Progress Notes (Signed)
Date:  January 30, 2017 Chart reviewed for concurrent status and case management needs.  Will continue to follow patient progress.  Discharge Planning: following for needs  Expected discharge date: [id number]  Rhonda Davis, BSN, RN3, CCM   [phone]  

## 2017-01-30 NOTE — Progress Notes (Signed)
PROGRESS NOTE    Stuart Mendoza  UEA:[id number] DOB: 1956-04-11 DOA: 01/28/2017 PCP: Patient, No Pcp Per    Brief Narrative:  61 y.o. male with medical history significant for alcohol abuse, hypertension, atrial fibrillation on xarelto. Due to altered mental status he is not a good historian. Per ED MD, pt came to ED 9/28 after he fell walking his dog, he was found to have a humerus fracture and he was discharged and his arm was placed in a sling. He apparently drank alcohol at home and had sustained another fall and came to ED for evaluation.   Assessment & Plan:   Principal Problem:   Acute metabolic encephalopathy - secondary to alcohol withdrawal  Atrial fibrillation - Chads score > 2 on xarelto - continue B blocker for rate control  Hyperglycemia/diabetes - Change diet to diabetic diet  Hypokalemia - resolved - d/c telemetry  COPD - Currently compensated and stable continue current medication regimen  Active Problems:   Hyponatremia - mild and stable. 132 on last check.    Accelerated hypertension - Patient is on amlodipine, labetalol, lisinopril   DVT prophylaxis: pt is on xarelto Code Status: Full Family Communication: None at bedside.  Disposition Plan: pending improvement in condition   Consultants:   None   Procedures: none   Antimicrobials: None   Subjective: Pt is still confused.   Objective: Vitals:   01/29/17 1310 01/29/17 2121 01/30/17 0041 01/30/17 0520  BP: (!) 142/80 (!) 133/94 (!) 147/95 (!) 150/75  Pulse: (!) 108 63 (!) 107 86  Resp: Temp: 98.9 F (37.2 C) 97.8 F (36.6 C) 97.8 F (36.6 C) 97.9 F (36.6 C)  TempSrc: Oral Oral Oral Oral  SpO2: 98% 98% 98% 97%  Weight:    101.7 kg (224 lb 1.6 oz)  Height:        Intake/Output Summary (Last 24 hours) at 01/30/17 1001 Last data filed at 01/30/17 0522  Gross per 24 hour  Intake             1380 ml  Output             1100 ml  Net              280 ml    Filed Weights   01/28/17 0628 01/29/17 0611 01/30/17 0520  Weight: 96.2 kg (212 lb) 100.3 kg (221 lb 3.2 oz) 101.7 kg (224 lb 1.6 oz)    Examination: Exam unchanged when compared to prior on 01/19/17  General exam: Pt in nad, alert and awake Respiratory system: Clear to auscultation. Respiratory effort normal. Cardiovascular system: S1 & S2 heard, RRR. No JVD, murmurs, rubs, gallops or clicks. No pedal edema. Gastrointestinal system: Abdomen is nondistended, soft and nontender. No organomegaly or masses felt. Normal bowel sounds heard. Central nervous system: Alert and oriented x2 to person, time but not place. No focal neurological deficits. Extremities: Symmetric 5 x 5 power. Skin: No rashes,or ulcers, has bruising on face near right eyebrow Psychiatry: Judgement and insight appear impaired. Mood & affect appropriate.     Data Reviewed: I have personally reviewed following labs and imaging studies  CBC:  Recent Labs Lab 01/27/17 0827 01/27/17 0840 01/28/17 0856 01/29/17 0703  WBC 14.1*  --  12.1* 9.1  NEUTROABS 12.2*  --  10.2*  --   HGB 11.2* 12.6* 11.0* 9.5*  HCT 34.6* 37.0* 34.2* 29.4*  MCV 86.3  --  86.8 86.7  PLT 228  --  204 190   Basic Metabolic Panel:  Recent Labs Lab 01/27/17 0840 01/28/17 0856 01/29/17 0703 01/30/17 0621  NA 128* 129* 132* 132*  K 4.0 3.7 3.1* 4.8  CL 96* 95* 96* 99*  CO2  --  GLUCOSE 119* 124* 106* 139*  BUN CREATININE 0.70 0.78 0.66 0.75  CALCIUM  --  9.0 8.7* 8.6*   GFR: Estimated Creatinine Clearance: 114.4 mL/min (by C-G formula based on SCr of 0.75 mg/dL). Liver Function Tests:  Recent Labs Lab 01/28/17 0856  AST 20  ALT 14*  ALKPHOS 96  BILITOT 1.6*  PROT 7.3  ALBUMIN 4.2   No results for input(s): LIPASE, AMYLASE in the last 168 hours. No results for input(s): AMMONIA in the last 168 hours. Coagulation Profile:  Recent Labs Lab 01/28/17 0856  INR 1.21   Cardiac Enzymes:  Recent  Labs Lab 01/27/17 0827  CKTOTAL 94   BNP (last 3 results) No results for input(s): PROBNP in the last 8760 hours. HbA1C: No results for input(s): HGBA1C in the last 72 hours. CBG:  Recent Labs Lab 01/29/17 0748 01/30/17 0738  GLUCAP 114* 137*   Lipid Profile: No results for input(s): CHOL, HDL, LDLCALC, TRIG, CHOLHDL, LDLDIRECT in the last 72 hours. Thyroid Function Tests:  Recent Labs  01/29/17 0703  TSH 0.652   Anemia Panel: No results for input(s): VITAMINB12, FOLATE, FERRITIN, TIBC, IRON, RETICCTPCT in the last 72 hours. Sepsis Labs: No results for input(s): PROCALCITON, LATICACIDVEN in the last 168 hours.  No results found for this or any previous visit (from the past 240 hour(s)).   Radiology Studies: No results found.  Scheduled Meds: . amLODipine  10 mg Oral Daily  . aspirin EC  81 mg Oral Daily  . fluticasone  2 spray Each Nare Daily  . folic acid  1 mg Oral Daily  . labetalol  100 mg Oral BID  . linagliptin  5 mg Oral Daily  . lisinopril  20 mg Oral Daily  . multivitamin with minerals  1 tablet Oral Daily  . nicotine  14 mg Transdermal Daily  . pantoprazole  40 mg Oral Daily  . rivaroxaban  20 mg Oral Daily  . tiotropium  18 mcg Inhalation Daily   Continuous Infusions: . sodium chloride 75 mL/hr at 01/28/17 2145     LOS: 2 days    Time spent: > 35 minutes  Penny Pia, MD [facility] Pager [phone]  If 7PM-7AM, please contact night-coverage [email or web address] Password [email or web address] 01/30/2017, 10:01 AM
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
## 2017-01-31 DIAGNOSIS — G9341 Metabolic encephalopathy: Secondary | ICD-10-CM | POA: Diagnosis not present

## 2017-01-31 DIAGNOSIS — F10231 Alcohol dependence with withdrawal delirium: Secondary | ICD-10-CM | POA: Diagnosis not present

## 2017-01-31 LAB — GLUCOSE, CAPILLARY
Glucose-Capillary: 128 mg/dL — ABNORMAL HIGH (ref 65–99)
Glucose-Capillary: 140 mg/dL — ABNORMAL HIGH (ref 65–99)

## 2017-01-31 NOTE — Progress Notes (Signed)
01/31/17 1654  What Happened  Was fall witnessed? No  Was patient injured? No  Patient found on floor;other (Comment) (on floor mat)  Found by Staff-comment Passenger transport manager)  Stated prior activity bathroom-unassisted  Follow Up  MD notified Cena Benton  Time MD notified [phone]  Family notified Yes-comment Josh Nicolosi)  Time family notified 1656  Additional tests No  Adult Fall Risk Assessment  Risk Factor Category (scoring not indicated) History of more than one fall within 6 months before admission (document High fall risk);Fall has occurred during this admission (document High fall risk)  Patient's Fall Risk High Fall Risk (>13 points)  Adult Fall Risk Interventions  Required Bundle Interventions *See Row Information* High fall risk - low, moderate, and high requirements implemented  Additional Interventions Use of appropriate toileting equipment (bedpan, BSC, etc.);Reorient/diversional activities with confused patients  Screening for Fall Injury Risk  Risk For Fall Injury- See Row Information  Bones - fracture risk;Bleeding Risk - anticoagulation (not prophylaxis)  Required Injury Bundle Interventions *See Row Information* Injury Bundle Implemented  Screening for Fall Injury Risk Interventions  Additional Interventions PT/OT consult  Vitals  Temp 97.8 F (36.6 C)  Temp Source Oral  BP (!) 141/83  MAP (mmHg) 97  BP Method Automatic  Patient Position (if appropriate) Sitting  Pulse Rate 98  Pulse Rate Source Dinamap  Resp 18  Oxygen Therapy  SpO2 98 %  O2 Device Room Air  Pain Assessment  Pain Assessment 0-10  Pain Score 8  Pain Type Acute pain  Pain Location Shoulder  Pain Orientation Right  Pain Descriptors / Indicators Aching  Pain Frequency Constant  Pain Onset On-going  Patients Stated Pain Goal 2  Pain Intervention(s) Medication (See eMAR)  PCA/Epidural/Spinal Assessment  Respiratory Pattern Regular;Unlabored  Neurological  Neuro (WDL) X  Level of Consciousness  Alert  Orientation Level Oriented to person;Oriented to time;Disoriented to situation;Disoriented to place  Cognition Follows commands;Poor judgement;Poor safety awareness;Memory impairment  Speech Clear  Musculoskeletal  Musculoskeletal (WDL) X  Assistive Device Sling  Generalized Weakness Yes  Weight Bearing Restrictions Yes  RUE Weight Bearing NWB  Musculoskeletal Details  RUE Limited movement;Ortho/Supportive Device  RUE Ortho/Supportive Device Sling (refuses to wear it)  Right Upper Arm Limited movement  Integumentary  Integumentary (WDL) X  RN Assisting with Skin Assessment on Admission [place]  Skin Color Pale  Skin Condition Dry  Skin Integrity Abrasion;Ecchymosis  Abrasion Location Knee  Abrasion Location Orientation Right  Abrasion Intervention Foam  Ecchymosis Location Abdomen;Arm;Face (forehead)  Ecchymosis Location Orientation Right;Left;Medial  Pain Assessment  Date Pain First Started 01/28/17  Pain Screening  Clinical Progression Not changed

## 2017-01-31 NOTE — Progress Notes (Signed)
PROGRESS NOTE    Stuart Mendoza  ZOX:[id number] DOB: 10-18-1955 DOA: 01/28/2017 PCP: Patient, No Pcp Per    Brief Narrative:  61 y.o. male with medical history significant for alcohol abuse, hypertension, atrial fibrillation on xarelto. Due to altered mental status he is not a good historian. Per ED MD, pt came to ED 9/28 after he fell walking his dog, he was found to have a humerus fracture and he was discharged and his arm was placed in a sling. He apparently drank alcohol at home and had sustained another fall and came to ED for evaluation.   Pt continues to be confused.   Assessment & Plan:   Principal Problem:   Acute metabolic encephalopathy - secondary to alcohol withdrawal - Patient still confused, continue CIWA protocol suspect in the next 2 or 3 days patient should have improvement in mentation  Atrial fibrillation - Chads score > 2 on xarelto for anticoagulation - continue B blocker for rate control  Hyperglycemia/diabetes - Continue diabetic diet - Currently diet controlled  Hypokalemia - resolved - d/c telemetry  COPD - Currently compensated and stable continue current medication regimen  Active Problems:   Hyponatremia - mild and stable. 132 on last check.    Accelerated hypertension - Patient is on amlodipine, labetalol, lisinopril   DVT prophylaxis: pt is on xarelto Code Status: Full Family Communication: None at bedside.  Disposition Plan: pending improvement in condition   Consultants:   None   Procedures: none   Antimicrobials: None   Subjective: Pt is still confused. He reports no new complaints  Objective: Vitals:   01/30/17 0520 01/30/17 1421 01/30/17 2152 01/31/17 0903  BP: (!) 150/75 120/74 (!) 154/85   Pulse: 86 91 (!) 104   Resp: Temp: 97.9 F (36.6 C) 98.4 F (36.9 C)    TempSrc: Oral Oral    SpO2: 97% 98% 94% 93%  Weight: 101.7 kg (224 lb 1.6 oz)     Height:        Intake/Output Summary (Last 24  hours) at 01/31/17 1342 Last data filed at 01/31/17 1113  Gross per 24 hour  Intake               30 ml  Output              900 ml  Net             -870 ml   Filed Weights   01/28/17 0628 01/29/17 0611 01/30/17 0520  Weight: 96.2 kg (212 lb) 100.3 kg (221 lb 3.2 oz) 101.7 kg (224 lb 1.6 oz)    Examination: Exam unchanged when compared to prior on 01/30/2017  General exam: Pt in nad, alert and awake, confused Respiratory system: Clear to auscultation. Respiratory effort normal. Cardiovascular system: S1 & S2 heard, RRR. No JVD, murmurs, rubs, gallops or clicks. No pedal edema. Gastrointestinal system: Abdomen is nondistended, soft and nontender. No organomegaly or masses felt. Normal bowel sounds heard. Central nervous system: Alert and oriented x2 to person, time but not place. No focal neurological deficits. Extremities: Symmetric 5 x 5 power. Skin: No rashes,or ulcers, has bruising on face near right eyebrow Psychiatry: Judgement and insight appear impaired. Mood & affect appropriate.     Data Reviewed: I have personally reviewed following labs and imaging studies  CBC:  Recent Labs Lab 01/27/17 0827 01/27/17 0840 01/28/17 0856 01/29/17 0703  WBC 14.1*  --  12.1* 9.1  NEUTROABS 12.2*  --  10.2*  --   HGB 11.2* 12.6* 11.0* 9.5*  HCT 34.6* 37.0* 34.2* 29.4*  MCV 86.3  --  86.8 86.7  PLT 228  --  204 190   Basic Metabolic Panel:  Recent Labs Lab 01/27/17 0840 01/28/17 0856 01/29/17 0703 01/30/17 0621  NA 128* 129* 132* 132*  K 4.0 3.7 3.1* 4.8  CL 96* 95* 96* 99*  CO2  --  GLUCOSE 119* 124* 106* 139*  BUN CREATININE 0.70 0.78 0.66 0.75  CALCIUM  --  9.0 8.7* 8.6*   GFR: Estimated Creatinine Clearance: 114.4 mL/min (by C-G formula based on SCr of 0.75 mg/dL). Liver Function Tests:  Recent Labs Lab 01/28/17 0856  AST 20  ALT 14*  ALKPHOS 96  BILITOT 1.6*  PROT 7.3  ALBUMIN 4.2   No results for input(s): LIPASE, AMYLASE in  the last 168 hours. No results for input(s): AMMONIA in the last 168 hours. Coagulation Profile:  Recent Labs Lab 01/28/17 0856  INR 1.21   Cardiac Enzymes:  Recent Labs Lab 01/27/17 0827  CKTOTAL 94   BNP (last 3 results) No results for input(s): PROBNP in the last 8760 hours. HbA1C: No results for input(s): HGBA1C in the last 72 hours. CBG:  Recent Labs Lab 01/29/17 0748 01/30/17 0738 01/31/17 0754  GLUCAP 114* 137* 128*   Lipid Profile: No results for input(s): CHOL, HDL, LDLCALC, TRIG, CHOLHDL, LDLDIRECT in the last 72 hours. Thyroid Function Tests:  Recent Labs  01/29/17 0703  TSH 0.652   Anemia Panel: No results for input(s): VITAMINB12, FOLATE, FERRITIN, TIBC, IRON, RETICCTPCT in the last 72 hours. Sepsis Labs: No results for input(s): PROCALCITON, LATICACIDVEN in the last 168 hours.  No results found for this or any previous visit (from the past 240 hour(s)).   Radiology Studies: No results found.  Scheduled Meds: . amLODipine  10 mg Oral Daily  . aspirin EC  81 mg Oral Daily  . chlorhexidine  15 mL Mouth Rinse BID  . fluticasone  2 spray Each Nare Daily  . folic acid  1 mg Oral Daily  . labetalol  100 mg Oral BID  . linagliptin  5 mg Oral Daily  . lisinopril  20 mg Oral Daily  . mouth rinse  15 mL Mouth Rinse q12n4p  . multivitamin with minerals  1 tablet Oral Daily  . nicotine  14 mg Transdermal Daily  . pantoprazole  40 mg Oral Daily  . rivaroxaban  20 mg Oral Daily  . tiotropium  18 mcg Inhalation Daily   Continuous Infusions: . sodium chloride 75 mL/hr at 01/28/17 2145     LOS: 3 days    Time spent: > 35 minutes  Penny Pia, MD [facility] Pager [phone]  If 7PM-7AM, please contact night-coverage [email or web address] Password [email or web address] 01/31/2017, 1:42 PM

## 2017-02-01 DIAGNOSIS — F10231 Alcohol dependence with withdrawal delirium: Secondary | ICD-10-CM | POA: Diagnosis not present

## 2017-02-01 DIAGNOSIS — G9341 Metabolic encephalopathy: Secondary | ICD-10-CM | POA: Diagnosis not present

## 2017-02-01 LAB — GLUCOSE, CAPILLARY
GLUCOSE-CAPILLARY: 101 mg/dL — AB (ref 65–99)
Glucose-Capillary: 127 mg/dL — ABNORMAL HIGH (ref 65–99)
Glucose-Capillary: 144 mg/dL — ABNORMAL HIGH (ref 65–99)

## 2017-02-01 MED ORDER — DIPHENHYDRAMINE HCL 25 MG PO CAPS
25.0000 mg | ORAL_CAPSULE | Freq: Four times a day (QID) | ORAL | Status: DC | PRN
  Administered 2017-02-01 (×2): 25 mg via ORAL
  Filled 2017-02-01 (×2): qty 1

## 2017-02-01 MED ORDER — POLYVINYL ALCOHOL 1.4 % OP SOLN
1.0000 [drp] | OPHTHALMIC | Status: DC | PRN
  Filled 2017-02-01: qty 15

## 2017-02-01 MED ORDER — INSULIN ASPART 100 UNIT/ML ~~LOC~~ SOLN
0.0000 [IU] | Freq: Three times a day (TID) | SUBCUTANEOUS | Status: DC
  Administered 2017-02-01: 1 [IU] via SUBCUTANEOUS

## 2017-02-01 MED ORDER — INSULIN ASPART 100 UNIT/ML ~~LOC~~ SOLN: 0.0000 [IU] | Freq: Every day | SUBCUTANEOUS | Status: DC

## 2017-02-01 NOTE — Progress Notes (Signed)
PROGRESS NOTE    Stuart Mendoza  ZOX:[id number] DOB: 07-18-1955 DOA: 01/28/2017 PCP: Patient, No Pcp Per   Brief Narrative:  61 year old male with history of alcohol abuse, hypertension, atrial fibrillation on Xarelto came to the hospital after sustaining a fall. He was found to have a humerus fracture and his arm was placed in a sling. During his hospitalization he also had some alcohol withdrawal symptoms including hallucinations.   Assessment & Plan:   Principal Problem:   Acute metabolic encephalopathy Active Problems:   Withdrawal syndrome (HCC)   Hyponatremia   Accelerated hypertension   October mental status Acute metabolic encephalopathy -This appears to have improved. Likely secondary to acute alcohol withdrawal -CIWA protocol in place, closely monitor his neurologic status, still having little intermittent confusion but improving.  -Encourage oral diet -Continue 1 mg folic acid daily  Right humerus fracture -Pain and swelling noted -Patient is noncompliant with his sling -Pain control -Of the pain worsens he'll need repeat x-ray.  History of atrial fibrillation -Continue Xarelto. I have extensively counseled him that he would remain at high risk of causing a brain bleed/life-threatening bleeding as he is on anticoagulation and abuses alcohol.  Diabetes type 2 -Accu-Chek sliding scale. On tradjenta.  History of COPD -Nebulizer treatments as needed  History of hypertension -Norvasc 10 mg daily, labetalol 100 mg twice daily, lisinopril 20 mg daily   DVT prophylaxis: Xarelto Code Status: Full code Family Communication:  None at bedside Disposition Plan: Should be able to go once his mentation has remained stable. Physical therapy recommended skilled nursing facility-social worker other.  Consultants:   Cursided ortho by the admitting physician?  Procedures:   None  Antimicrobials:   None   Subjective: Continuous report of right upper extremity  pain and swelling have extensively asked him to become compliant with his sling. Episodes of very brief confusion but overall improved. No acute events overnight.  Objective: Vitals:   02/01/17 0459 02/01/17 0801 02/01/17 0947 02/01/17 1321  BP: (!) 144/84  134/81 119/69  Pulse: 79  98 91  Resp: Temp: 98.2 F (36.8 C)   (!) 97.5 F (36.4 C)  TempSrc: Oral   Oral  SpO2: 98% 98%  99%  Weight: 109.3 kg (241 lb)     Height:        Intake/Output Summary (Last 24 hours) at 02/01/17 1352 Last data filed at 02/01/17 1015  Gross per 24 hour  Intake              240 ml  Output                3 ml  Net              237 ml   Filed Weights   01/29/17 0611 01/30/17 0520 02/01/17 0459  Weight: 100.3 kg (221 lb 3.2 oz) 101.7 kg (224 lb 1.6 oz) 109.3 kg (241 lb)    Examination:  General exam: Appears calm and comfortable  Respiratory system: Clear to auscultation. Respiratory effort normal. Cardiovascular system: S1 & S2 heard, RRR. No JVD, murmurs, rubs, gallops or clicks. No pedal edema. Gastrointestinal system: Abdomen is nondistended, soft and nontender. No organomegaly or masses felt. Normal bowel sounds heard. Central nervous system: Alert and oriented. No focal neurological deficits. Extremities: Symmetric 5 x 5 power. Skin: No rashes, lesions or ulcers Psychiatry: Judgement and insight appear normal. Mood & affect appropriate.     Data Reviewed:   CBC:  Recent  Labs Lab 01/27/17 0827 01/27/17 0840 01/28/17 0856 01/29/17 0703  WBC 14.1*  --  12.1* 9.1  NEUTROABS 12.2*  --  10.2*  --   HGB 11.2* 12.6* 11.0* 9.5*  HCT 34.6* 37.0* 34.2* 29.4*  MCV 86.3  --  86.8 86.7  PLT 228  --  204 190   Basic Metabolic Panel:  Recent Labs Lab 01/27/17 0840 01/28/17 0856 01/29/17 0703 01/30/17 0621  NA 128* 129* 132* 132*  K 4.0 3.7 3.1* 4.8  CL 96* 95* 96* 99*  CO2  --  GLUCOSE 119* 124* 106* 139*  BUN CREATININE 0.70 0.78 0.66 0.75    CALCIUM  --  9.0 8.7* 8.6*   GFR: Estimated Creatinine Clearance: 118.5 mL/min (by C-G formula based on SCr of 0.75 mg/dL). Liver Function Tests:  Recent Labs Lab 01/28/17 0856  AST 20  ALT 14*  ALKPHOS 96  BILITOT 1.6*  PROT 7.3  ALBUMIN 4.2   No results for input(s): LIPASE, AMYLASE in the last 168 hours. No results for input(s): AMMONIA in the last 168 hours. Coagulation Profile:  Recent Labs Lab 01/28/17 0856  INR 1.21   Cardiac Enzymes:  Recent Labs Lab 01/27/17 0827  CKTOTAL 94   BNP (last 3 results) No results for input(s): PROBNP in the last 8760 hours. HbA1C: No results for input(s): HGBA1C in the last 72 hours. CBG:  Recent Labs Lab 01/29/17 0748 01/30/17 0738 01/31/17 0754 01/31/17 1724 02/01/17 0721  GLUCAP 114* 137* 128* 140* 127*   Lipid Profile: No results for input(s): CHOL, HDL, LDLCALC, TRIG, CHOLHDL, LDLDIRECT in the last 72 hours. Thyroid Function Tests: No results for input(s): TSH, T4TOTAL, FREET4, T3FREE, THYROIDAB in the last 72 hours. Anemia Panel: No results for input(s): VITAMINB12, FOLATE, FERRITIN, TIBC, IRON, RETICCTPCT in the last 72 hours. Sepsis Labs: No results for input(s): PROCALCITON, LATICACIDVEN in the last 168 hours.  No results found for this or any previous visit (from the past 240 hour(s)).       Radiology Studies: No results found.      Scheduled Meds: . amLODipine  10 mg Oral Daily  . aspirin EC  81 mg Oral Daily  . chlorhexidine  15 mL Mouth Rinse BID  . fluticasone  2 spray Each Nare Daily  . folic acid  1 mg Oral Daily  . labetalol  100 mg Oral BID  . linagliptin  5 mg Oral Daily  . lisinopril  20 mg Oral Daily  . mouth rinse  15 mL Mouth Rinse q12n4p  . multivitamin with minerals  1 tablet Oral Daily  . nicotine  14 mg Transdermal Daily  . pantoprazole  40 mg Oral Daily  . rivaroxaban  20 mg Oral Daily  . tiotropium  18 mcg Inhalation Daily   Continuous Infusions:   LOS: 4 days     Time spent:    Kaena Santori Joline Maxcy, MD [facility] Pager [phone]   If 7PM-7AM, please contact night-coverage [email or web address] Password [email or web address] 02/01/2017, 1:52 PM

## 2017-02-01 NOTE — NC FL2 (Signed)
[place] MEDICAID FL2 LEVColbey WirtanenREENING TOOL     IDENTIFICATION  Patient Name: Stuart Mendoza Birthdate: 1955/12/26 Sex: male Admission Date (Current Location): 01/28/2017  [place] and [place] Number:  Haynes Bast [id number] O Facility and Address:  [facility],  [address], [place] [address]      Provider Number: [id number]  Attending Physician Name and Address:  Dimple Nanas, MD  Relative Name and Phone Number:  Kaige, Whistler   [phone]     Current Level of Care: Hospital Recommended Level of Care: Skilled Nursing Facility Prior Approval Number:    Date Approved/Denied:   PASRR Number: [id number]  Discharge Plan: SNF    Current Diagnoses: Patient Active Problem List   Diagnosis Date Noted  . Withdrawal syndrome (HCC) 01/28/2017  . Acute metabolic encephalopathy 01/28/2017  . Hyponatremia 01/28/2017  . Accelerated hypertension 01/28/2017    Orientation RESPIRATION BLADDER Height & Weight     Self, Time, Situation, Place  Normal External catheter Weight: 241 lb (109.3 kg) Height:   (177.8 cm)  BEHAVIORAL SYMPTOMS/MOOD NEUROLOGICAL BOWEL NUTRITION STATUS      Continent Diet  AMBULATORY STATUS COMMUNICATION OF NEEDS Skin   Extensive Assist Verbally  (Laceration on Head)                       Personal Care Assistance Level of Assistance  Bathing, Feeding, Dressing Bathing Assistance: Limited assistance Feeding assistance: Independent Dressing Assistance: Limited assistance     Functional Limitations Info  Sight, Hearing, Speech Sight Info: Impaired Hearing Info: Impaired Speech Info: Adequate    SPECIAL CARE FACTORS FREQUENCY  PT (By licensed PT), OT (By licensed OT)     PT Frequency: 5x/week OT Frequency: 5x/week            Contractures Contractures Info: Not present    Additional Factors Info  Code Status, Allergies Code Status Info: Fullcode Allergies Info: Metformin And Related            Current Medications (02/01/2017):  This is the current hospital active medication list Current Facility-Administered Medications  Medication Dose Route Frequency Provider Last Rate Last Dose  . acetaminophen (TYLENOL) tablet 650 mg  650 mg Oral Q6H PRN Alison Murray, MD   650 mg at 01/31/17 2228   Or  . acetaminophen (TYLENOL) suppository 650 mg  650 mg Rectal Q6H PRN Alison Murray, MD      . albuterol (PROVENTIL) (2.5 MG/3ML) 0.083% nebulizer solution 2.5 mg  2.5 mg Nebulization Q4H PRN Alison Murray, MD      . amLODipine (NORVASC) tablet 10 mg  10 mg Oral Daily Tegeler, Canary Brim, MD   10 mg at 02/01/17 0951  . aspirin EC tablet 81 mg  81 mg Oral Daily Alison Murray, MD   81 mg at 02/01/17 8413  . chlorhexidine (PERIDEX) 0.12 % solution 15 mL  15 mL Mouth Rinse BID Penny Pia, MD   15 mL at 02/01/17 0936  . diphenhydrAMINE (BENADRYL) capsule 25 mg  25 mg Oral Q6H PRN Syrena Burges Chirag, MD   25 mg at 02/01/17 1251  . fluticasone (FLONASE) 50 MCG/ACT nasal spray 2 spray  2 spray Each Nare Daily Alison Murray, MD   2 spray at 02/01/17 [phone]  . folic acid (FOLVITE) tablet 1 mg  1 mg Oral Daily Alison Murray, MD   1 mg at 02/01/17 1027  . hydrALAZINE (APRESOLINE) injection 10 mg  10 mg  Intravenous Q6H PRN Alison Murray, MD      . HYDROcodone-acetaminophen [facility]) 10-325 MG per tablet 1 tablet  1 tablet Oral Q6H PRN Alison Murray, MD   1 tablet at 02/01/17 1031  . labetalol (NORMODYNE) tablet 100 mg  100 mg Oral BID Alison Murray, MD   100 mg at 02/01/17 0950  . linagliptin (TRADJENTA) tablet 5 mg  5 mg Oral Daily Alison Murray, MD   5 mg at 02/01/17 0950  . lisinopril (PRINIVIL,ZESTRIL) tablet 20 mg  20 mg Oral Daily Tegeler, Canary Brim, MD   20 mg at 02/01/17 0950  . MEDLINE mouth rinse  15 mL Mouth Rinse q12n4p Penny Pia, MD   15 mL at 02/01/17 1200  . multivitamin with minerals tablet 1 tablet  1 tablet Oral Daily Alison Murray, MD   1 tablet at 02/01/17 [phone]  .  nicotine (NICODERM CQ - dosed in mg/24 hours) patch 14 mg  14 mg Transdermal Daily Audrea Muscat T, NP   14 mg at 02/01/17 0940  . ondansetron (ZOFRAN) tablet 4 mg  4 mg Oral Q6H PRN Alison Murray, MD       Or  . ondansetron [facility] injection 4 mg  4 mg Intravenous Q6H PRN Alison Murray, MD      . pantoprazole (PROTONIX) EC tablet 40 mg  40 mg Oral Daily Alison Murray, MD   40 mg at 02/01/17 1191  . rivaroxaban (XARELTO) tablet 20 mg  20 mg Oral Daily Alison Murray, MD   20 mg at 02/01/17 4782  . tiotropium (SPIRIVA) inhalation capsule 18 mcg  18 mcg Inhalation Daily Alison Murray, MD   18 mcg at 02/01/17 0800     Discharge Medications: Please see discharge summary for a list of discharge medications.  Relevant Imaging Results:  Relevant Lab Results:   Additional Information SSN:[id number]  Clearance Coots, LCSW
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
## 2017-02-02 DIAGNOSIS — I482 Chronic atrial fibrillation: Secondary | ICD-10-CM | POA: Diagnosis not present

## 2017-02-02 DIAGNOSIS — F1593 Other stimulant use, unspecified with withdrawal: Secondary | ICD-10-CM | POA: Diagnosis not present

## 2017-02-02 DIAGNOSIS — G9341 Metabolic encephalopathy: Secondary | ICD-10-CM | POA: Diagnosis not present

## 2017-02-02 DIAGNOSIS — I4891 Unspecified atrial fibrillation: Secondary | ICD-10-CM | POA: Diagnosis present

## 2017-02-02 DIAGNOSIS — J449 Chronic obstructive pulmonary disease, unspecified: Secondary | ICD-10-CM

## 2017-02-02 DIAGNOSIS — I10 Essential (primary) hypertension: Secondary | ICD-10-CM | POA: Diagnosis not present

## 2017-02-02 DIAGNOSIS — E119 Type 2 diabetes mellitus without complications: Secondary | ICD-10-CM

## 2017-02-02 DIAGNOSIS — E669 Obesity, unspecified: Secondary | ICD-10-CM

## 2017-02-02 DIAGNOSIS — F10231 Alcohol dependence with withdrawal delirium: Secondary | ICD-10-CM | POA: Diagnosis not present

## 2017-02-02 DIAGNOSIS — E871 Hypo-osmolality and hyponatremia: Secondary | ICD-10-CM | POA: Diagnosis not present

## 2017-02-02 DIAGNOSIS — E876 Hypokalemia: Secondary | ICD-10-CM | POA: Diagnosis not present

## 2017-02-02 LAB — GLUCOSE, CAPILLARY
GLUCOSE-CAPILLARY: 109 mg/dL — AB (ref 65–99)
Glucose-Capillary: 105 mg/dL — ABNORMAL HIGH (ref 65–99)
Glucose-Capillary: 115 mg/dL — ABNORMAL HIGH (ref 65–99)
Glucose-Capillary: 168 mg/dL — ABNORMAL HIGH (ref 65–99)

## 2017-02-02 MED ORDER — POLYVINYL ALCOHOL 1.4 % OP SOLN: 1.0000 [drp] | OPHTHALMIC | 0 refills | Status: DC | PRN

## 2017-02-02 MED ORDER — CHLORHEXIDINE GLUCONATE 0.12 % MT SOLN: 15.0000 mL | Freq: Two times a day (BID) | OROMUCOSAL | 0 refills | Status: DC

## 2017-02-02 MED ORDER — LABETALOL HCL 100 MG PO TABS: 100.0000 mg | ORAL_TABLET | Freq: Two times a day (BID) | ORAL | Status: DC

## 2017-02-02 MED ORDER — HYDROCODONE-ACETAMINOPHEN 10-325 MG PO TABS: 1.0000 | ORAL_TABLET | Freq: Four times a day (QID) | ORAL | 0 refills | Status: DC

## 2017-02-02 MED ORDER — NICOTINE 14 MG/24HR TD PT24: 14.0000 mg | MEDICATED_PATCH | Freq: Every day | TRANSDERMAL | 0 refills | Status: DC

## 2017-02-02 MED ORDER — FOLIC ACID 1 MG PO TABS: 1.0000 mg | ORAL_TABLET | Freq: Every day | ORAL | Status: DC

## 2017-02-02 MED ORDER — ADULT MULTIVITAMIN W/MINERALS CH: 1.0000 | ORAL_TABLET | Freq: Every day | ORAL | Status: DC

## 2017-02-02 NOTE — Progress Notes (Signed)
Patient has requested CSW allow him until 3:30pm to decide on a facility. CSW will follow up with patient at 3:30pm, to assist with discharge to SNF today.  Vivi Barrack, Theresia Majors, MSW Clinical Social Worker 5E and Psychiatric Service Line [phone] 02/02/2017  1:48 PM

## 2017-02-02 NOTE — Progress Notes (Signed)
02/02/17  1921  Report called to [facility] [phone] Report given to [facility].

## 2017-02-02 NOTE — Clinical Social Work Placement (Addendum)
4:25 [facility] called for Transport. Nurse call report to: [phone] Room number:[id number]  CLINICAL SOCIAL WORK PLACEMENT  NOTE  Date:  02/02/2017  Patient Details  Name: Stuart Mendoza MRN: [id number] Date of Birth: 03-18-1956  Clinical Social Work is seeking post-discharge placement for this patient at the Skilled  Nursing Facility level of care (*CSW will initial, date and re-position this form in  chart as items are completed):  Yes   Patient/family provided with [place] Clinical Social Work Department's list of facilities offering this level of care within the geographic area requested by the patient (or if unable, by the patient's family).  Yes   Patient/family informed of their freedom to choose among providers that offer the needed level of care, that participate in Medicare, Medicaid or managed care program needed by the patient, have an available bed and are willing to accept the patient.  Yes   Patient/family informed of [facility]'s ownership interest in [facility] and [facility], as well as of the fact that they are under no obligation to receive care at these facilities.  PASRR submitted to EDS on       PASRR number received on       Existing PASRR number confirmed on 02/01/17     FL2 transmitted to all facilities in geographic area requested by pt/family on       FL2 transmitted to all facilities within larger geographic area on       Patient informed that his/her managed care company has contracts with or will negotiate with certain facilities, including the following:  [facility]     Yes   Patient/family informed of bed offers received.  Patient chooses bed at [facility]     Physician recommends and patient chooses bed at      Patient to be transferred to [facility] on 02/02/17.  Patient to be transferred to facility by [facility]     Patient family notified on 02/02/17 of transfer.  Name of family member notified:  No  family     PHYSICIAN Please prepare priority discharge summary, including medications     Additional Comment:    _______________________________________________ Clearance Coots, LCSW 02/02/2017, 3:54 PM

## 2017-02-02 NOTE — Discharge Summary (Addendum)
Physician Discharge Summary  Stuart Mendoza EAV:[id number] DOB: 1956/03/23 DOA: 01/28/2017  PCP: Patient, No Pcp Per  Admit date: 01/28/2017 Discharge date: 02/02/2017  Admitted From: Home Discharge disposition: SNF   Recommendations for Outpatient Follow-Up:   1. The patient is being discharged to a SNF for rehab. Will Need a referral to a PCP upon discharge from his rehabilitation facility. 2. We'll need to follow-up with orthopedic surgery for his right humerus fracture.   Discharge Diagnosis:   Principal Problem:   Acute metabolic encephalopathy Active Problems:   Withdrawal syndrome (HCC)   Hyponatremia   Accelerated hypertension   Unspecified atrial fibrillation (HCC)   Type 2 diabetes mellitus without complication (HCC)   Hypokalemia   COPD (chronic obstructive pulmonary disease) (HCC)   Obesity (BMI 30-39.9)   Right humerus fracture  Discharge Condition: Improved.  Diet recommendation: Low sodium, heart healthy.  Carbohydrate-modified.    History of Present Illness:   Stuart Mendoza is an 61 y.o. male significant for alcohol abuse, hypertension, atrial fibrillation on xarelto. Due to altered mental status he was not a good historian. Per ED MD, pt came to ED 01/27/17 after he fell walking his dog, he was found to have a humerus fracture and he was discharged and his arm was placed in a sling. He apparently drank alcohol at home and had sustained another fall and came to ED for evaluation.   Hospital Course by Problem:   Principal Problem: Acute metabolic encephalopathy Thought to be secondary to alcohol withdrawal. He appears to be mentally clear at present, and stable for discharge. She was score 0-5.  Active problems: Right humerus fracture Maintain sling. Follow-up orthopedic surgeon.  Atrial fibrillation Chads score > 2 on xareltofor anticoagulation. Rate controlled on labetalol.  Hyperglycemia/diabetes Managed with Januvia at home. Has been  managed with sliding scale insulin, sensitive scale here. Will resume Januvia.  Hypokalemia Resolved with supplementation.  COPD Currently compensated and stable.  Hyponatremia Mild and stable. 132 on last check.  Accelerated hypertension Patient is on amlodipine, labetalol, lisinopril.    Obesity Body mass index is 34.58 kg/m.   Medical Consultants:    None.   Discharge Exam:   Vitals:   02/02/17 0900 02/02/17 1001  BP: 123/72   Pulse: 88   Resp: 20   Temp:    SpO2: 97% 98%   Vitals:   02/01/17 2120 02/02/17 0533 02/02/17 0900 02/02/17 1001  BP: 121/86 (!) 133/91 123/72   Pulse: 88 78 88   Resp: Temp: 97.8 F (36.6 C) 98.3 F (36.8 C)    TempSrc: Oral Oral    SpO2: 96% 99% 97% 98%  Weight:      Height:        General exam: Appears calm and comfortable. Scattered ecchymosis including a bruise above his right eye. Respiratory system: Clear to auscultation. Respiratory effort normal. Cardiovascular system: Heart sounds are irregular. No JVD,  rubs, gallops or clicks. No murmurs. Gastrointestinal system: Abdomen is nondistended, soft and nontender. No organomegaly or masses felt. Normal bowel sounds heard. Central nervous system: Alert and oriented. No focal neurological deficits. Extremities: No clubbing,  or cyanosis. No edema. Significant ecchymosis. Skin: No rashes, lesions or ulcers. Significant ecchymosis. Psychiatry: Judgement and insight appear poor. Mood & affect anxious.    The results of significant diagnostics from this hospitalization (including imaging, microbiology, ancillary and laboratory) are listed below for reference.     Procedures and Diagnostic Studies:   Dg Shoulder  Right  Result Date: 01/28/2017 CLINICAL DATA:  Known right comminuted proximal humerus fracture with impaction sustained several days ago. Patient fell again this evening causing right shoulder to hurt. EXAM: RIGHT SHOULDER - 2+ VIEW COMPARISON:   Right shoulder and humerus radiographs from 1 day prior. FINDINGS: Re- demonstration of a comminuted impacted surgical neck fracture of the humerus with comminution. Slightly more apparent fracture undermining the greater tuberosity is seen on current exam fell also present in retrospect from the prior humerus radiographs. No joint dislocation. AC joint is maintained. The adjacent ribs and lung are nonacute. IMPRESSION: Comminuted surgical neck fracture with comminution, stable in appearance. Fracture undermining the greater tuberosity is again seen. No new fracture or joint dislocations. Electronically Signed   By: Tollie Eth M.D.   On: 01/28/2017 01:32   Ct Head Wo Contrast  Result Date: 01/28/2017 CLINICAL DATA:  61 year old male with fall and injury. Frontal headache. EXAM: CT HEAD WITHOUT CONTRAST TECHNIQUE: Contiguous axial images were obtained from the base of the skull through the vertex without intravenous contrast. COMPARISON:  01/27/2017 CT and MR FINDINGS: Brain: No evidence of acute infarction, hemorrhage, hydrocephalus, extra-axial collection or mass lesion/mass effect. Moderate bifrontal encephalomalacia and chronic small-vessel white matter ischemic changes again noted. Vascular: Intracranial atherosclerotic calcifications again noted. Skull: No acute abnormality Sinuses/Orbits: No acute abnormality Other: None IMPRESSION: 1. No evidence of acute intracranial abnormality 2. Moderate bifrontal encephalomalacia Electronically Signed   By: Harmon Pier M.D.   On: 01/28/2017 09:29     Labs:   Basic Metabolic Panel:  Recent Labs Lab 01/27/17 0840 01/28/17 0856 01/29/17 0703 01/30/17 0621  NA 128* 129* 132* 132*  K 4.0 3.7 3.1* 4.8  CL 96* 95* 96* 99*  CO2  --  GLUCOSE 119* 124* 106* 139*  BUN CREATININE 0.70 0.78 0.66 0.75  CALCIUM  --  9.0 8.7* 8.6*   GFR Estimated Creatinine Clearance: 118.5 mL/min (by C-G formula based on SCr of 0.75 mg/dL). Liver  Function Tests:  Recent Labs Lab 01/28/17 0856  AST 20  ALT 14*  ALKPHOS 96  BILITOT 1.6*  PROT 7.3  ALBUMIN 4.2   Coagulation profile  Recent Labs Lab 01/28/17 0856  INR 1.21    CBC:  Recent Labs Lab 01/27/17 0827 01/27/17 0840 01/28/17 0856 01/29/17 0703  WBC 14.1*  --  12.1* 9.1  NEUTROABS 12.2*  --  10.2*  --   HGB 11.2* 12.6* 11.0* 9.5*  HCT 34.6* 37.0* 34.2* 29.4*  MCV 86.3  --  86.8 86.7  PLT 228  --  204 190   Cardiac Enzymes:  Recent Labs Lab 01/27/17 0827  CKTOTAL 94   CBG:  Recent Labs Lab 02/01/17 0721 02/01/17 1702 02/01/17 2132 02/02/17 0723 02/02/17 1149  GLUCAP 127* 144* 101* 105* 115*    Discharge Instructions:   Discharge Instructions    Call MD for:  extreme fatigue    Complete by:  As directed    Call MD for:  persistant nausea and vomiting    Complete by:  As directed    Call MD for:  severe uncontrolled pain    Complete by:  As directed    Call MD for:  temperature >100.4    Complete by:  As directed    Diet - low sodium heart healthy    Complete by:  As directed    Diet Carb Modified    Complete by:  As directed  Increase activity slowly    Complete by:  As directed      Allergies as of 02/02/2017      Reactions   Metformin And Related       Medication List    TAKE these medications   amLODipine 10 MG tablet Commonly known as:  NORVASC Take 10 mg by mouth daily.   aspirin EC 81 MG tablet Take 81 mg by mouth daily.   chlorhexidine 0.12 % solution Commonly known as:  PERIDEX 15 mLs by Mouth Rinse route 2 (two) times daily.   fluticasone 50 MCG/ACT nasal spray Commonly known as:  FLONASE Place 2 sprays into both nostrils daily.   folic acid 1 MG tablet Commonly known as:  FOLVITE Take 1 tablet (1 mg total) by mouth daily.   HYDROcodone-acetaminophen 10-325 MG tablet Commonly known as:  NORCO Take 1 tablet by mouth 4 (four) times daily. What changed:  Another medication with the same name was  removed. Continue taking this medication, and follow the directions you see here.   JANUVIA 100 MG tablet Generic drug:  sitaGLIPtin Take 100 mg by mouth daily.   labetalol 100 MG tablet Commonly known as:  NORMODYNE Take 1 tablet (100 mg total) by mouth 2 (two) times daily.   lisinopril 20 MG tablet Commonly known as:  PRINIVIL,ZESTRIL Take 20 mg by mouth daily.   multivitamin with minerals Tabs tablet Take 1 tablet by mouth daily.   nicotine 14 mg/24hr patch Commonly known as:  NICODERM CQ - dosed in mg/24 hours Place 1 patch (14 mg total) onto the skin daily.   omeprazole 40 MG capsule Commonly known as:  PRILOSEC Take 40 mg by mouth daily.   polyvinyl alcohol 1.4 % ophthalmic solution Commonly known as:  LIQUIFILM TEARS Place 1 drop into both eyes as needed for dry eyes.   PROAIR HFA 108 (90 Base) MCG/ACT inhaler Generic drug:  albuterol Place 2 puffs into alternate nostrils every 4 (four) hours as needed for wheezing or shortness of breath.   SPIRIVA HANDIHALER 18 MCG inhalation capsule Generic drug:  tiotropium Place 18 mcg into inhaler and inhale daily.   XARELTO 20 MG Tabs tablet Generic drug:  rivaroxaban Take 20 mg by mouth daily.      Follow-up Information    [facility]-EMERGENCY DEPT.   Specialty:  Emergency Medicine Why:  If symptoms worsen Contact information: [address] [id number] [place] [address] [phone]           Time coordinating discharge: 35 minutes.  Signed:  Kristofor Michalowski  Pager [phone] [facility] 02/02/2017, 1:08 PM

## 2017-02-02 NOTE — Care Management Important Message (Signed)
Important Message  Patient Details  Name: Stuart Mendoza MRN: [id number] Date of Birth: [birth year]   Medicare Important Message Given:  Yes    Caren Macadam 02/02/2017, 11:44 AMImportant Message  Patient Details  Name: Stuart Mendoza MRN: [id number] Date of Birth: April 30, 1956   Medicare Important Message Given:  Yes    Caren Macadam 02/02/2017, 11:44 AM

## 2017-02-02 NOTE — Progress Notes (Signed)
Date:  February 02, 2017 Chart reviewed for concurrent status and case management needs.  Will continue to follow patient progress.  Discharge Planning: following for needs  Expected discharge date: [id number]  Lawrnce Reyez, BSN, RN3, CCM   [phone]  

## 2017-02-02 NOTE — Progress Notes (Signed)
February 02, 2017 Chart and discharge orders researched for Case Management needs. None found and patient discharged to appropriate level of care. Patient and family have no further questions. Rhonda Davis, BSN, RN3, CCM [phone]. 

## 2017-02-04 ENCOUNTER — Emergency Department (HOSPITAL_COMMUNITY): Payer: Medicare Other

## 2017-02-04 ENCOUNTER — Inpatient Hospital Stay (HOSPITAL_COMMUNITY): Payer: Medicare Other

## 2017-02-04 ENCOUNTER — Inpatient Hospital Stay (HOSPITAL_COMMUNITY)
Admission: EM | Admit: 2017-02-04 | Discharge: 2017-03-02 | DRG: 004 | Disposition: A | Discharge status: Hospice | Payer: Medicare Other | Attending: Family Medicine | Admitting: Family Medicine

## 2017-02-04 DIAGNOSIS — J690 Pneumonitis due to inhalation of food and vomit: Secondary | ICD-10-CM | POA: Diagnosis present

## 2017-02-04 DIAGNOSIS — N179 Acute kidney failure, unspecified: Secondary | ICD-10-CM | POA: Diagnosis not present

## 2017-02-04 DIAGNOSIS — D6489 Other specified anemias: Secondary | ICD-10-CM | POA: Diagnosis present

## 2017-02-04 DIAGNOSIS — Z8673 Personal history of transient ischemic attack (TIA), and cerebral infarction without residual deficits: Secondary | ICD-10-CM

## 2017-02-04 DIAGNOSIS — Z7951 Long term (current) use of inhaled steroids: Secondary | ICD-10-CM

## 2017-02-04 DIAGNOSIS — R0603 Acute respiratory distress: Secondary | ICD-10-CM

## 2017-02-04 DIAGNOSIS — G919 Hydrocephalus, unspecified: Secondary | ICD-10-CM | POA: Diagnosis not present

## 2017-02-04 DIAGNOSIS — E860 Dehydration: Secondary | ICD-10-CM | POA: Diagnosis not present

## 2017-02-04 DIAGNOSIS — Z9911 Dependence on respirator [ventilator] status: Secondary | ICD-10-CM | POA: Diagnosis not present

## 2017-02-04 DIAGNOSIS — J449 Chronic obstructive pulmonary disease, unspecified: Secondary | ICD-10-CM | POA: Diagnosis present

## 2017-02-04 DIAGNOSIS — F17200 Nicotine dependence, unspecified, uncomplicated: Secondary | ICD-10-CM | POA: Diagnosis not present

## 2017-02-04 DIAGNOSIS — Y93K1 Activity, walking an animal: Secondary | ICD-10-CM

## 2017-02-04 DIAGNOSIS — R402112 Coma scale, eyes open, never, at arrival to emergency department: Secondary | ICD-10-CM | POA: Diagnosis present

## 2017-02-04 DIAGNOSIS — Z66 Do not resuscitate: Secondary | ICD-10-CM | POA: Diagnosis not present

## 2017-02-04 DIAGNOSIS — J9811 Atelectasis: Secondary | ICD-10-CM

## 2017-02-04 DIAGNOSIS — Z515 Encounter for palliative care: Secondary | ICD-10-CM

## 2017-02-04 DIAGNOSIS — D638 Anemia in other chronic diseases classified elsewhere: Secondary | ICD-10-CM | POA: Diagnosis present

## 2017-02-04 DIAGNOSIS — A419 Sepsis, unspecified organism: Secondary | ICD-10-CM | POA: Diagnosis not present

## 2017-02-04 DIAGNOSIS — J9382 Other air leak: Secondary | ICD-10-CM | POA: Diagnosis not present

## 2017-02-04 DIAGNOSIS — Z930 Tracheostomy status: Secondary | ICD-10-CM

## 2017-02-04 DIAGNOSIS — G936 Cerebral edema: Secondary | ICD-10-CM | POA: Diagnosis not present

## 2017-02-04 DIAGNOSIS — F1020 Alcohol dependence, uncomplicated: Secondary | ICD-10-CM | POA: Diagnosis present

## 2017-02-04 DIAGNOSIS — S42301A Unspecified fracture of shaft of humerus, right arm, initial encounter for closed fracture: Secondary | ICD-10-CM | POA: Diagnosis present

## 2017-02-04 DIAGNOSIS — I10 Essential (primary) hypertension: Secondary | ICD-10-CM | POA: Diagnosis present

## 2017-02-04 DIAGNOSIS — J9503 Malfunction of tracheostomy stoma: Secondary | ICD-10-CM

## 2017-02-04 DIAGNOSIS — J9601 Acute respiratory failure with hypoxia: Secondary | ICD-10-CM | POA: Diagnosis present

## 2017-02-04 DIAGNOSIS — E870 Hyperosmolality and hypernatremia: Secondary | ICD-10-CM | POA: Diagnosis not present

## 2017-02-04 DIAGNOSIS — R509 Fever, unspecified: Secondary | ICD-10-CM

## 2017-02-04 DIAGNOSIS — I482 Chronic atrial fibrillation: Secondary | ICD-10-CM | POA: Diagnosis not present

## 2017-02-04 DIAGNOSIS — T502X5A Adverse effect of carbonic-anhydrase inhibitors, benzothiadiazides and other diuretics, initial encounter: Secondary | ICD-10-CM | POA: Diagnosis not present

## 2017-02-04 DIAGNOSIS — I615 Nontraumatic intracerebral hemorrhage, intraventricular: Secondary | ICD-10-CM | POA: Diagnosis present

## 2017-02-04 DIAGNOSIS — E669 Obesity, unspecified: Secondary | ICD-10-CM | POA: Diagnosis present

## 2017-02-04 DIAGNOSIS — J95851 Ventilator associated pneumonia: Secondary | ICD-10-CM | POA: Diagnosis not present

## 2017-02-04 DIAGNOSIS — Z7984 Long term (current) use of oral hypoglycemic drugs: Secondary | ICD-10-CM

## 2017-02-04 DIAGNOSIS — E871 Hypo-osmolality and hyponatremia: Secondary | ICD-10-CM | POA: Diagnosis present

## 2017-02-04 DIAGNOSIS — Z7189 Other specified counseling: Secondary | ICD-10-CM | POA: Diagnosis not present

## 2017-02-04 DIAGNOSIS — R0682 Tachypnea, not elsewhere classified: Secondary | ICD-10-CM

## 2017-02-04 DIAGNOSIS — H5340 Unspecified visual field defects: Secondary | ICD-10-CM | POA: Diagnosis present

## 2017-02-04 DIAGNOSIS — J988 Other specified respiratory disorders: Secondary | ICD-10-CM | POA: Diagnosis not present

## 2017-02-04 DIAGNOSIS — I610 Nontraumatic intracerebral hemorrhage in hemisphere, subcortical: Secondary | ICD-10-CM | POA: Diagnosis not present

## 2017-02-04 DIAGNOSIS — I611 Nontraumatic intracerebral hemorrhage in hemisphere, cortical: Secondary | ICD-10-CM | POA: Diagnosis present

## 2017-02-04 DIAGNOSIS — Y848 Other medical procedures as the cause of abnormal reaction of the patient, or of later complication, without mention of misadventure at the time of the procedure: Secondary | ICD-10-CM | POA: Diagnosis not present

## 2017-02-04 DIAGNOSIS — Z7901 Long term (current) use of anticoagulants: Secondary | ICD-10-CM

## 2017-02-04 DIAGNOSIS — E872 Acidosis: Secondary | ICD-10-CM

## 2017-02-04 DIAGNOSIS — Z7982 Long term (current) use of aspirin: Secondary | ICD-10-CM

## 2017-02-04 DIAGNOSIS — R402312 Coma scale, best motor response, none, at arrival to emergency department: Secondary | ICD-10-CM | POA: Diagnosis present

## 2017-02-04 DIAGNOSIS — Z6831 Body mass index (BMI) 31.0-31.9, adult: Secondary | ICD-10-CM

## 2017-02-04 DIAGNOSIS — R402222 Coma scale, best verbal response, incomprehensible words, at arrival to emergency department: Secondary | ICD-10-CM | POA: Diagnosis present

## 2017-02-04 DIAGNOSIS — E1165 Type 2 diabetes mellitus with hyperglycemia: Secondary | ICD-10-CM | POA: Diagnosis present

## 2017-02-04 DIAGNOSIS — I619 Nontraumatic intracerebral hemorrhage, unspecified: Secondary | ICD-10-CM

## 2017-02-04 DIAGNOSIS — I612 Nontraumatic intracerebral hemorrhage in hemisphere, unspecified: Secondary | ICD-10-CM

## 2017-02-04 DIAGNOSIS — G8194 Hemiplegia, unspecified affecting left nondominant side: Secondary | ICD-10-CM | POA: Diagnosis present

## 2017-02-04 DIAGNOSIS — R0602 Shortness of breath: Secondary | ICD-10-CM

## 2017-02-04 DIAGNOSIS — F17210 Nicotine dependence, cigarettes, uncomplicated: Secondary | ICD-10-CM | POA: Diagnosis present

## 2017-02-04 DIAGNOSIS — I480 Paroxysmal atrial fibrillation: Secondary | ICD-10-CM | POA: Diagnosis present

## 2017-02-04 DIAGNOSIS — T17990A Other foreign object in respiratory tract, part unspecified in causing asphyxiation, initial encounter: Secondary | ICD-10-CM | POA: Diagnosis not present

## 2017-02-04 DIAGNOSIS — E876 Hypokalemia: Secondary | ICD-10-CM | POA: Diagnosis present

## 2017-02-04 DIAGNOSIS — E1151 Type 2 diabetes mellitus with diabetic peripheral angiopathy without gangrene: Secondary | ICD-10-CM | POA: Diagnosis present

## 2017-02-04 DIAGNOSIS — W010XXA Fall on same level from slipping, tripping and stumbling without subsequent striking against object, initial encounter: Secondary | ICD-10-CM | POA: Diagnosis present

## 2017-02-04 DIAGNOSIS — Z888 Allergy status to other drugs, medicaments and biological substances status: Secondary | ICD-10-CM

## 2017-02-04 DIAGNOSIS — J810 Acute pulmonary edema: Secondary | ICD-10-CM | POA: Diagnosis not present

## 2017-02-04 DIAGNOSIS — J9690 Respiratory failure, unspecified, unspecified whether with hypoxia or hypercapnia: Secondary | ICD-10-CM

## 2017-02-04 DIAGNOSIS — J9602 Acute respiratory failure with hypercapnia: Secondary | ICD-10-CM

## 2017-02-04 DIAGNOSIS — E785 Hyperlipidemia, unspecified: Secondary | ICD-10-CM | POA: Diagnosis present

## 2017-02-04 DIAGNOSIS — I639 Cerebral infarction, unspecified: Secondary | ICD-10-CM

## 2017-02-04 HISTORY — DX: Hypokalemia: E87.6

## 2017-02-04 HISTORY — DX: Cerebral infarction, unspecified: I63.9

## 2017-02-04 HISTORY — DX: Hypo-osmolality and hyponatremia: E87.1

## 2017-02-04 LAB — MRSA PCR SCREENING: MRSA by PCR: NEGATIVE

## 2017-02-04 LAB — POCT I-STAT 3, ART BLOOD GAS (G3+)
ACID-BASE EXCESS: 1 mmol/L (ref 0.0–2.0)
BICARBONATE: 24.4 mmol/L (ref 20.0–28.0)
O2 SAT: 97 %
TCO2: 25 mmol/L (ref 22–32)
pCO2 arterial: 32.6 mmHg (ref 32.0–48.0)
pH, Arterial: 7.484 — ABNORMAL HIGH (ref 7.350–7.450)
pO2, Arterial: 81 mmHg — ABNORMAL LOW (ref 83.0–108.0)

## 2017-02-04 LAB — CBC WITH DIFFERENTIAL/PLATELET
BASOS ABS: 0 10*3/uL (ref 0.0–0.1)
BASOS PCT: 0 %
EOS ABS: 0 10*3/uL (ref 0.0–0.7)
Eosinophils Relative: 0 %
HCT: 35 % — ABNORMAL LOW (ref 39.0–52.0)
HEMOGLOBIN: 11.2 g/dL — AB (ref 13.0–17.0)
LYMPHS ABS: 1.1 10*3/uL (ref 0.7–4.0)
LYMPHS PCT: 7 %
MCH: 27.4 pg (ref 26.0–34.0)
MCHC: 32 g/dL (ref 30.0–36.0)
MCV: 85.6 fL (ref 78.0–100.0)
MONO ABS: 0.8 10*3/uL (ref 0.1–1.0)
Monocytes Relative: 5 %
NEUTROS ABS: 14.3 10*3/uL — AB (ref 1.7–7.7)
Neutrophils Relative %: 88 %
PLATELETS: 321 10*3/uL (ref 150–400)
RBC: 4.09 MIL/uL — ABNORMAL LOW (ref 4.22–5.81)
RDW: 16.2 % — AB (ref 11.5–15.5)
WBC: 16.2 10*3/uL — ABNORMAL HIGH (ref 4.0–10.5)

## 2017-02-04 LAB — COMPREHENSIVE METABOLIC PANEL
ALBUMIN: 3.7 g/dL (ref 3.5–5.0)
ALK PHOS: 99 U/L (ref 38–126)
ALT: 18 U/L (ref 17–63)
ANION GAP: 14 (ref 5–15)
AST: 21 U/L (ref 15–41)
BILIRUBIN TOTAL: 2.4 mg/dL — AB (ref 0.3–1.2)
BUN: 12 mg/dL (ref 6–20)
CALCIUM: 9.8 mg/dL (ref 8.9–10.3)
CO2: 25 mmol/L (ref 22–32)
CREATININE: 0.82 mg/dL (ref 0.61–1.24)
Chloride: 94 mmol/L — ABNORMAL LOW (ref 101–111)
GFR calc Af Amer: >60 mL/min (ref >60)
GFR calc non Af Amer: >60 mL/min (ref >60)
GLUCOSE: 196 mg/dL — AB (ref 65–99)
Potassium: 3.4 mmol/L — ABNORMAL LOW (ref 3.5–5.1)
Sodium: 133 mmol/L — ABNORMAL LOW (ref 135–145)
TOTAL PROTEIN: 7.5 g/dL (ref 6.5–8.1)

## 2017-02-04 LAB — URINALYSIS, ROUTINE W REFLEX MICROSCOPIC
BACTERIA UA: NONE SEEN
BILIRUBIN URINE: NEGATIVE
Glucose, UA: 150 mg/dL — AB
HGB URINE DIPSTICK: NEGATIVE
Ketones, ur: 20 mg/dL — AB
Leukocytes, UA: NEGATIVE
NITRITE: NEGATIVE
PH: 7 (ref 5.0–8.0)
Protein, ur: 30 mg/dL — AB
Specific Gravity, Urine: 1.013 (ref 1.005–1.030)

## 2017-02-04 LAB — BASIC METABOLIC PANEL
ANION GAP: 7 (ref 5–15)
ANION GAP: 8 (ref 5–15)
BUN: 12 mg/dL (ref 6–20)
BUN: 13 mg/dL (ref 6–20)
CALCIUM: 8.4 mg/dL — AB (ref 8.9–10.3)
CALCIUM: 8.6 mg/dL — AB (ref 8.9–10.3)
CO2: 25 mmol/L (ref 22–32)
CO2: 26 mmol/L (ref 22–32)
CREATININE: 0.72 mg/dL (ref 0.61–1.24)
CREATININE: 0.68 mg/dL (ref 0.61–1.24)
Chloride: 104 mmol/L (ref 101–111)
Chloride: 103 mmol/L (ref 101–111)
GFR calc non Af Amer: >60 mL/min (ref >60)
Glucose, Bld: 174 mg/dL — ABNORMAL HIGH (ref 65–99)
Glucose, Bld: 169 mg/dL — ABNORMAL HIGH (ref 65–99)
Potassium: 3.2 mmol/L — ABNORMAL LOW (ref 3.5–5.1)
Potassium: 2.9 mmol/L — ABNORMAL LOW (ref 3.5–5.1)
SODIUM: 136 mmol/L (ref 135–145)
SODIUM: 137 mmol/L (ref 135–145)

## 2017-02-04 LAB — I-STAT ARTERIAL BLOOD GAS, ED
Acid-Base Excess: 5 mmol/L — ABNORMAL HIGH (ref 0.0–2.0)
Bicarbonate: 28.6 mmol/L — ABNORMAL HIGH (ref 20.0–28.0)
O2 Saturation: 100 %
PH ART: 7.502 — AB (ref 7.350–7.450)
PO2 ART: 236 mmHg — AB (ref 83.0–108.0)
TCO2: 30 mmol/L (ref 22–32)
pCO2 arterial: 36.4 mmHg (ref 32.0–48.0)

## 2017-02-04 LAB — I-STAT VENOUS BLOOD GAS, ED
Acid-Base Excess: 6 mmol/L — ABNORMAL HIGH (ref 0.0–2.0)
Bicarbonate: 28.4 mmol/L — ABNORMAL HIGH (ref 20.0–28.0)
O2 SAT: 87 %
PCO2 VEN: 34 mmHg — AB (ref 44.0–60.0)
PO2 VEN: 46 mmHg — AB (ref 32.0–45.0)
TCO2: 29 mmol/L (ref 22–32)
pH, Ven: 7.53 — ABNORMAL HIGH (ref 7.250–7.430)

## 2017-02-04 LAB — ACETAMINOPHEN LEVEL

## 2017-02-04 LAB — PROTIME-INR
INR: 1.12
INR: 1.14
PROTHROMBIN TIME: 14.5 s (ref 11.4–15.2)
Prothrombin Time: 14.3 seconds (ref 11.4–15.2)

## 2017-02-04 LAB — ETHANOL: Alcohol, Ethyl (B): <10 mg/dL (ref <10)

## 2017-02-04 LAB — I-STAT TROPONIN, ED: TROPONIN I, POC: 0.09 ng/mL — AB (ref 0.00–0.08)

## 2017-02-04 LAB — HEMOGLOBIN A1C
Hgb A1c MFr Bld: 5.7 % — ABNORMAL HIGH (ref 4.8–5.6)
Mean Plasma Glucose: 116.89 mg/dL

## 2017-02-04 LAB — AMMONIA: Ammonia: 35 umol/L (ref 9–35)

## 2017-02-04 LAB — I-STAT CG4 LACTIC ACID, ED: Lactic Acid, Venous: 1.94 mmol/L — ABNORMAL HIGH (ref 0.5–1.9)

## 2017-02-04 LAB — GLUCOSE, CAPILLARY: GLUCOSE-CAPILLARY: 163 mg/dL — AB (ref 65–99)

## 2017-02-04 LAB — TRIGLYCERIDES: Triglycerides: 55 mg/dL (ref <150)

## 2017-02-04 LAB — SALICYLATE LEVEL

## 2017-02-04 LAB — CBG MONITORING, ED: Glucose-Capillary: 193 mg/dL — ABNORMAL HIGH (ref 65–99)

## 2017-02-04 MED ORDER — INSULIN ASPART 100 UNIT/ML ~~LOC~~ SOLN: 0.0000 [IU] | Freq: Three times a day (TID) | SUBCUTANEOUS | Status: DC

## 2017-02-04 MED ORDER — SODIUM CHLORIDE 0.9 % IV BOLUS (SEPSIS)
1000.0000 mL | Freq: Once | INTRAVENOUS | Status: AC
  Administered 2017-02-04: 1000 mL via INTRAVENOUS

## 2017-02-04 MED ORDER — ASPIRIN 300 MG RE SUPP: 300.0000 mg | RECTAL | Status: DC

## 2017-02-04 MED ORDER — PROTHROMBIN COMPLEX CONC HUMAN 500 UNITS IV KIT
5357.0000 [IU] | PACK | Status: AC
  Administered 2017-02-04: 5357 [IU] via INTRAVENOUS
  Filled 2017-02-04: qty 214

## 2017-02-04 MED ORDER — ETOMIDATE 2 MG/ML IV SOLN
INTRAVENOUS | Status: DC | PRN
  Administered 2017-02-04: 20 mg via INTRAVENOUS

## 2017-02-04 MED ORDER — PIPERACILLIN-TAZOBACTAM 3.375 G IVPB 30 MIN
3.3750 g | Freq: Once | INTRAVENOUS | Status: AC
  Administered 2017-02-04: 3.375 g via INTRAVENOUS
  Filled 2017-02-04: qty 50

## 2017-02-04 MED ORDER — ASPIRIN 81 MG PO CHEW: 324.0000 mg | CHEWABLE_TABLET | ORAL | Status: DC

## 2017-02-04 MED ORDER — PROPOFOL 1000 MG/100ML IV EMUL
INTRAVENOUS | Status: AC
  Administered 2017-02-04: 5 ug/kg/min
  Filled 2017-02-04: qty 100

## 2017-02-04 MED ORDER — VANCOMYCIN HCL IN DEXTROSE 1-5 GM/200ML-% IV SOLN
1000.0000 mg | Freq: Two times a day (BID) | INTRAVENOUS | Status: DC
  Administered 2017-02-05: 1000 mg via INTRAVENOUS
  Filled 2017-02-04 (×2): qty 200

## 2017-02-04 MED ORDER — POTASSIUM CHLORIDE 10 MEQ/50ML IV SOLN
10.0000 meq | INTRAVENOUS | Status: AC
  Administered 2017-02-04 (×2): 10 meq via INTRAVENOUS
  Filled 2017-02-04 (×2): qty 50

## 2017-02-04 MED ORDER — SODIUM CHLORIDE 0.9 % IV BOLUS (SEPSIS)
1000.0000 mL | Freq: Once | INTRAVENOUS | Status: DC
  Administered 2017-02-04: 1000 mL via INTRAVENOUS

## 2017-02-04 MED ORDER — FENTANYL CITRATE (PF) 100 MCG/2ML IJ SOLN
100.0000 ug | INTRAMUSCULAR | Status: DC | PRN
  Administered 2017-02-05 (×2): 100 ug via INTRAVENOUS
  Filled 2017-02-04 (×2): qty 2

## 2017-02-04 MED ORDER — LABETALOL HCL 5 MG/ML IV SOLN
10.0000 mg | Freq: Once | INTRAVENOUS | Status: DC
  Filled 2017-02-04: qty 4

## 2017-02-04 MED ORDER — PROPOFOL 1000 MG/100ML IV EMUL
0.0000 ug/kg/min | INTRAVENOUS | Status: DC
  Administered 2017-02-04: 10 ug/kg/min via INTRAVENOUS
  Administered 2017-02-04: 15 ug/kg/min via INTRAVENOUS
  Administered 2017-02-05: 20 ug/kg/min via INTRAVENOUS
  Filled 2017-02-04 (×2): qty 100

## 2017-02-04 MED ORDER — VANCOMYCIN HCL 10 G IV SOLR
2000.0000 mg | Freq: Once | INTRAVENOUS | Status: AC
  Administered 2017-02-04: 2000 mg via INTRAVENOUS
  Filled 2017-02-04: qty 2000

## 2017-02-04 MED ORDER — SODIUM PHOSPHATES 45 MMOLE/15ML IV SOLN
10.0000 mmol | Freq: Once | INTRAVENOUS | Status: AC
  Administered 2017-02-04: 10 mmol via INTRAVENOUS
  Filled 2017-02-04: qty 3.33

## 2017-02-04 MED ORDER — VANCOMYCIN HCL IN DEXTROSE 1-5 GM/200ML-% IV SOLN
1000.0000 mg | Freq: Once | INTRAVENOUS | Status: DC
  Filled 2017-02-04: qty 200

## 2017-02-04 MED ORDER — NICARDIPINE HCL IN NACL 20-0.86 MG/200ML-% IV SOLN
3.0000 mg/h | Freq: Once | INTRAVENOUS | Status: DC
  Filled 2017-02-04: qty 200

## 2017-02-04 MED ORDER — PIPERACILLIN-TAZOBACTAM 3.375 G IVPB 30 MIN: 3.3750 g | Freq: Three times a day (TID) | INTRAVENOUS | Status: DC

## 2017-02-04 MED ORDER — ROCURONIUM BROMIDE 50 MG/5ML IV SOLN
INTRAVENOUS | Status: DC | PRN
  Administered 2017-02-04: 100 mg via INTRAVENOUS

## 2017-02-04 MED ORDER — SODIUM CHLORIDE 3 % IV SOLN
INTRAVENOUS | Status: DC
  Administered 2017-02-04: 40 mL/h via INTRAVENOUS
  Administered 2017-02-05 – 2017-02-06 (×4): 60 mL/h via INTRAVENOUS
  Filled 2017-02-04 (×12): qty 500

## 2017-02-04 MED ORDER — LABETALOL HCL 5 MG/ML IV SOLN
10.0000 mg | INTRAVENOUS | Status: DC | PRN
  Administered 2017-02-05 – 2017-02-08 (×7): 10 mg via INTRAVENOUS
  Filled 2017-02-04 (×7): qty 4

## 2017-02-04 MED ORDER — MAGNESIUM SULFATE 2 GM/50ML IV SOLN
2.0000 g | Freq: Once | INTRAVENOUS | Status: AC
  Administered 2017-02-04: 2 g via INTRAVENOUS
  Filled 2017-02-04: qty 50

## 2017-02-04 MED ORDER — SODIUM CHLORIDE 23.4 % INJECTION (4 MEQ/ML) FOR IV ADMINISTRATION
30.0000 mL | Freq: Once | INTRAVENOUS | Status: AC
  Administered 2017-02-04: 30 mL via INTRAVENOUS
  Filled 2017-02-04: qty 30

## 2017-02-04 MED ORDER — FAMOTIDINE IN NACL 20-0.9 MG/50ML-% IV SOLN
20.0000 mg | Freq: Two times a day (BID) | INTRAVENOUS | Status: DC
  Administered 2017-02-04 – 2017-02-05 (×3): 20 mg via INTRAVENOUS
  Filled 2017-02-04 (×3): qty 50

## 2017-02-04 MED ORDER — ACETYLCYSTEINE 20 % IN SOLN
4.0000 mL | Freq: Once | RESPIRATORY_TRACT | Status: AC
  Administered 2017-02-04: 4 mL via RESPIRATORY_TRACT
  Filled 2017-02-04: qty 4

## 2017-02-04 MED ORDER — ACETYLCYSTEINE 10 % IN SOLN
10.0000 mL | Freq: Once | RESPIRATORY_TRACT | Status: DC
  Filled 2017-02-04: qty 10

## 2017-02-04 MED ORDER — ALBUTEROL SULFATE (2.5 MG/3ML) 0.083% IN NEBU
2.5000 mg | INHALATION_SOLUTION | Freq: Four times a day (QID) | RESPIRATORY_TRACT | Status: DC
  Administered 2017-02-04 – 2017-02-15 (×43): 2.5 mg via RESPIRATORY_TRACT
  Filled 2017-02-04 (×43): qty 3

## 2017-02-04 MED ORDER — LABETALOL HCL 5 MG/ML IV SOLN
20.0000 mg | Freq: Once | INTRAVENOUS | Status: AC
  Administered 2017-02-04: 20 mg via INTRAVENOUS

## 2017-02-04 MED ORDER — LABETALOL HCL 5 MG/ML IV SOLN: 10.0000 mg | Freq: Once | INTRAVENOUS | Status: DC

## 2017-02-04 MED ORDER — ONDANSETRON HCL 4 MG/2ML IJ SOLN: 4.0000 mg | Freq: Four times a day (QID) | INTRAMUSCULAR | Status: DC | PRN

## 2017-02-04 MED ORDER — NALOXONE HCL 0.4 MG/ML IJ SOLN
INTRAMUSCULAR | Status: AC
  Administered 2017-02-04: 0.4 mg
  Filled 2017-02-04: qty 1

## 2017-02-04 MED ORDER — FENTANYL CITRATE (PF) 100 MCG/2ML IJ SOLN
100.0000 ug | INTRAMUSCULAR | Status: DC | PRN
  Administered 2017-02-05 – 2017-02-06 (×9): 100 ug via INTRAVENOUS
  Filled 2017-02-04 (×9): qty 2

## 2017-02-04 MED ORDER — ACETAMINOPHEN 650 MG RE SUPP
650.0000 mg | Freq: Once | RECTAL | Status: AC
  Administered 2017-02-04: 650 mg via RECTAL
  Filled 2017-02-04: qty 1

## 2017-02-04 MED ORDER — CHLORHEXIDINE GLUCONATE 0.12% ORAL RINSE (MEDLINE KIT)
15.0000 mL | Freq: Two times a day (BID) | OROMUCOSAL | Status: DC
  Administered 2017-02-04 – 2017-02-26 (×44): 15 mL via OROMUCOSAL

## 2017-02-04 MED ORDER — INSULIN ASPART 100 UNIT/ML ~~LOC~~ SOLN
0.0000 [IU] | SUBCUTANEOUS | Status: DC
  Administered 2017-02-04 – 2017-02-05 (×3): 3 [IU] via SUBCUTANEOUS
  Administered 2017-02-05 (×2): 2 [IU] via SUBCUTANEOUS
  Administered 2017-02-05: 3 [IU] via SUBCUTANEOUS
  Administered 2017-02-05: 5 [IU] via SUBCUTANEOUS
  Administered 2017-02-05 – 2017-02-06 (×2): 2 [IU] via SUBCUTANEOUS
  Administered 2017-02-06 (×6): 3 [IU] via SUBCUTANEOUS
  Administered 2017-02-07 (×2): 2 [IU] via SUBCUTANEOUS
  Administered 2017-02-07 (×4): 3 [IU] via SUBCUTANEOUS
  Administered 2017-02-08: 5 [IU] via SUBCUTANEOUS
  Administered 2017-02-08 (×2): 3 [IU] via SUBCUTANEOUS
  Administered 2017-02-08 (×2): 5 [IU] via SUBCUTANEOUS
  Administered 2017-02-08 – 2017-02-09 (×4): 3 [IU] via SUBCUTANEOUS
  Administered 2017-02-09: 5 [IU] via SUBCUTANEOUS
  Administered 2017-02-09: 2 [IU] via SUBCUTANEOUS
  Administered 2017-02-09: 8 [IU] via SUBCUTANEOUS
  Administered 2017-02-10: 3 [IU] via SUBCUTANEOUS
  Administered 2017-02-10: 2 [IU] via SUBCUTANEOUS
  Administered 2017-02-10: 3 [IU] via SUBCUTANEOUS
  Administered 2017-02-10: 2 [IU] via SUBCUTANEOUS
  Administered 2017-02-10: 5 [IU] via SUBCUTANEOUS
  Administered 2017-02-11 (×2): 3 [IU] via SUBCUTANEOUS
  Administered 2017-02-11 (×2): 5 [IU] via SUBCUTANEOUS
  Administered 2017-02-11 – 2017-02-12 (×3): 3 [IU] via SUBCUTANEOUS
  Administered 2017-02-12: 5 [IU] via SUBCUTANEOUS
  Administered 2017-02-12 (×3): 3 [IU] via SUBCUTANEOUS
  Administered 2017-02-12: 5 [IU] via SUBCUTANEOUS
  Administered 2017-02-13: 3 [IU] via SUBCUTANEOUS
  Administered 2017-02-13 (×2): 5 [IU] via SUBCUTANEOUS
  Administered 2017-02-13: 8 [IU] via SUBCUTANEOUS
  Administered 2017-02-13 (×2): 3 [IU] via SUBCUTANEOUS
  Administered 2017-02-13: 5 [IU] via SUBCUTANEOUS
  Administered 2017-02-14: 3 [IU] via SUBCUTANEOUS
  Administered 2017-02-14 – 2017-02-15 (×5): 5 [IU] via SUBCUTANEOUS
  Administered 2017-02-15: 3 [IU] via SUBCUTANEOUS
  Administered 2017-02-15 (×2): 5 [IU] via SUBCUTANEOUS
  Administered 2017-02-15: 3 [IU] via SUBCUTANEOUS
  Administered 2017-02-15: 5 [IU] via SUBCUTANEOUS
  Administered 2017-02-16: 2 [IU] via SUBCUTANEOUS
  Administered 2017-02-16: 3 [IU] via SUBCUTANEOUS
  Administered 2017-02-16 (×2): 5 [IU] via SUBCUTANEOUS
  Administered 2017-02-16 – 2017-02-17 (×2): 3 [IU] via SUBCUTANEOUS
  Administered 2017-02-17: 2 [IU] via SUBCUTANEOUS
  Administered 2017-02-17 – 2017-02-18 (×6): 3 [IU] via SUBCUTANEOUS
  Administered 2017-02-18: 2 [IU] via SUBCUTANEOUS
  Administered 2017-02-19: 3 [IU] via SUBCUTANEOUS
  Administered 2017-02-19: 5 [IU] via SUBCUTANEOUS
  Administered 2017-02-19: 2 [IU] via SUBCUTANEOUS
  Administered 2017-02-19 (×2): 3 [IU] via SUBCUTANEOUS
  Administered 2017-02-20 (×2): 2 [IU] via SUBCUTANEOUS
  Administered 2017-02-20: 3 [IU] via SUBCUTANEOUS
  Administered 2017-02-20: 5 [IU] via SUBCUTANEOUS
  Administered 2017-02-20: 2 [IU] via SUBCUTANEOUS
  Administered 2017-02-20: 5 [IU] via SUBCUTANEOUS
  Administered 2017-02-21: 3 [IU] via SUBCUTANEOUS
  Administered 2017-02-21: 2 [IU] via SUBCUTANEOUS
  Administered 2017-02-21: 3 [IU] via SUBCUTANEOUS
  Administered 2017-02-21: 5 [IU] via SUBCUTANEOUS
  Administered 2017-02-21 – 2017-02-22 (×2): 2 [IU] via SUBCUTANEOUS
  Administered 2017-02-22: 3 [IU] via SUBCUTANEOUS
  Administered 2017-02-22: 5 [IU] via SUBCUTANEOUS
  Administered 2017-02-22 (×2): 3 [IU] via SUBCUTANEOUS
  Administered 2017-02-22: 5 [IU] via SUBCUTANEOUS
  Administered 2017-02-23 (×2): 3 [IU] via SUBCUTANEOUS
  Administered 2017-02-23: 5 [IU] via SUBCUTANEOUS
  Administered 2017-02-23: 2 [IU] via SUBCUTANEOUS
  Administered 2017-02-23: 3 [IU] via SUBCUTANEOUS
  Administered 2017-02-24 (×3): 5 [IU] via SUBCUTANEOUS
  Administered 2017-02-24: 3 [IU] via SUBCUTANEOUS
  Administered 2017-02-24 – 2017-02-25 (×5): 5 [IU] via SUBCUTANEOUS
  Administered 2017-02-25 – 2017-02-26 (×4): 3 [IU] via SUBCUTANEOUS

## 2017-02-04 MED ORDER — PIPERACILLIN-TAZOBACTAM 3.375 G IVPB
3.3750 g | Freq: Three times a day (TID) | INTRAVENOUS | Status: DC
  Administered 2017-02-04 – 2017-02-06 (×5): 3.375 g via INTRAVENOUS
  Filled 2017-02-04 (×6): qty 50

## 2017-02-04 MED ORDER — FENTANYL CITRATE (PF) 100 MCG/2ML IJ SOLN
INTRAMUSCULAR | Status: AC
  Administered 2017-02-05: 100 ug via INTRAVENOUS
  Filled 2017-02-04: qty 2

## 2017-02-04 MED ORDER — ACETAMINOPHEN 325 MG PO TABS
650.0000 mg | ORAL_TABLET | ORAL | Status: DC | PRN
Start: 2017-02-04 — End: 2017-02-05

## 2017-02-04 MED ORDER — SODIUM CHLORIDE 3 % IN NEBU
4.0000 mL | INHALATION_SOLUTION | Freq: Four times a day (QID) | RESPIRATORY_TRACT | Status: AC
  Administered 2017-02-04 – 2017-02-07 (×11): 4 mL via RESPIRATORY_TRACT
  Filled 2017-02-04 (×14): qty 4

## 2017-02-04 MED ORDER — THIAMINE HCL 100 MG/ML IJ SOLN
100.0000 mg | Freq: Every day | INTRAMUSCULAR | Status: DC
  Administered 2017-02-04 – 2017-02-05 (×2): 100 mg via INTRAVENOUS
  Filled 2017-02-04 (×2): qty 2

## 2017-02-04 MED ORDER — ORAL CARE MOUTH RINSE
15.0000 mL | OROMUCOSAL | Status: DC
  Administered 2017-02-04 – 2017-02-13 (×89): 15 mL via OROMUCOSAL

## 2017-02-04 MED ORDER — PROTHROMBIN COMPLEX CONC HUMAN 500 UNITS IV KIT
5339.0000 [IU] | PACK | Status: DC
  Filled 2017-02-04: qty 214

## 2017-02-04 NOTE — H&P (Signed)
PULMONARY / CRITICAL CARE MEDICINE   Name: Stuart Mendoza MRN: [id number] DOB: 02/13/1956    ADMISSION DATE:  02/04/2017   CHIEF COMPLAINT:  Fever and altered mental status.  HISTORY OF PRESENT ILLNESS:   This is a 61 year old type II diabetic with a history of alcohol abuse who was recently admitted to this hospital on 9/29 with hallucinations and falls. He was discharged her also for atrial fibrillation.  Arrival in the emergency room, he was obtunded and intubated for airway protection. There was a terrific amount of purulent material in the upper airway. Initial chest x-ray showed a very dense infiltrate in the right upper lobe. Subsequently head CT was obtained which shows a very large right parietal intraparenchymal hemorrhage with extension into the ventricular system, likely evolving hydrocephalus, and a 1.5 cm midline shift. PAST MEDICAL HISTORY :  He  has a past medical history of Atrial fibrillation (HCC); COPD (chronic obstructive pulmonary disease) (HCC); Diabetes mellitus without complication (HCC); and Hypertension.  PAST SURGICAL HISTORY: He  has a past surgical history that includes Appendectomy and Tonsillectomy.  Allergies  Allergen Reactions  . Metformin And Related     No current facility-administered medications on file prior to encounter.    Current Outpatient Prescriptions on File Prior to Encounter  Medication Sig  . amLODipine (NORVASC) 10 MG tablet Take 10 mg by mouth daily.  Marland Kitchen aspirin EC 81 MG tablet Take 81 mg by mouth daily.  . chlorhexidine (PERIDEX) 0.12 % solution 15 mLs by Mouth Rinse route 2 (two) times daily.  . fluticasone (FLONASE) 50 MCG/ACT nasal spray Place 2 sprays into both nostrils daily.  . folic acid (FOLVITE) 1 MG tablet Take 1 tablet (1 mg total) by mouth daily.  Marland Kitchen HYDROcodone-acetaminophen (NORCO) 10-325 MG tablet Take 1 tablet by mouth 4 (four) times daily.  Marland Kitchen JANUVIA 100 MG tablet Take 100 mg by mouth daily.  Marland Kitchen labetalol  (NORMODYNE) 100 MG tablet Take 1 tablet (100 mg total) by mouth 2 (two) times daily.  Marland Kitchen lisinopril (PRINIVIL,ZESTRIL) 20 MG tablet Take 20 mg by mouth daily.  . Multiple Vitamin (MULTIVITAMIN WITH MINERALS) TABS tablet Take 1 tablet by mouth daily.  . nicotine (NICODERM CQ - DOSED IN MG/24 HOURS) 14 mg/24hr patch Place 1 patch (14 mg total) onto the skin daily.  Marland Kitchen omeprazole (PRILOSEC) 40 MG capsule Take 40 mg by mouth daily.  . polyvinyl alcohol (LIQUIFILM TEARS) 1.4 % ophthalmic solution Place 1 drop into both eyes as needed for dry eyes.  Marland Kitchen PROAIR HFA 108 (90 Base) MCG/ACT inhaler Place 2 puffs into alternate nostrils every 4 (four) hours as needed for wheezing or shortness of breath.  . SPIRIVA HANDIHALER 18 MCG inhalation capsule Place 18 mcg into inhaler and inhale daily.  Carlena Hurl 20 MG TABS tablet Take 20 mg by mouth daily.    FAMILY HISTORY:  His has no family status information on file.    SOCIAL HISTORY: He  reports that he has been smoking.  He has never used smokeless tobacco. He reports that he drinks alcohol. He reports that he does not use drugs.  REVIEW OF SYSTEMS:   Unobtainable  SUBJECTIVE:  Not obtainable  VITAL SIGNS: BP 116/85   Pulse 98   Temp (!) 101.1 F (38.4 C) (Rectal)   Resp 18   Ht 6' (1.829 m)   SpO2 100%   HEMODYNAMICS:    VENTILATOR SETTINGS: Vent Mode: PRVC FiO2 (%):  [100 %] 100 % Set Rate:  [13  bmp] 18 bmp Vt Set:  [960 mL] 620 mL PEEP:  [5 cmH20] 5 cmH20 Plateau Pressure:  [17 cmH20] 17 cmH20  INTAKE / OUTPUT: No intake/output data recorded.  PHYSICAL EXAMINATION: General: He is orally intubated mechanically ventilated and unresponsive. Neuro:  Pupils are 1-2 mm equal and reactive, EOMs are full, the face is symmetric, he does not respond to voice, is minimal random movement of all fours to noxious stimuli. HEENT:  There are multiple old abrasions over the right orbit. Cardiovascular:  S1 and S2 are now regular although I do not  see P waves on the monitor. Heart sounds are somewhat distant there is no murmur rub or gallop. Lungs:  Initially there were symmetric air movements with scattered rhonchi and no wheezes. Abdomen:  The abdomen is somewhat obese without any organomegaly masses tenderness guarding or rebound. Skin:  Has multiple ecchymoses most prominent over the right shoulder and right arm.  LABS:  BMET  Recent Labs Lab 01/29/17 0703 01/30/17 0621 02/04/17 1001  NA 132* 132* 133*  K 3.1* 4.8 3.4*  CL 96* 99* 94*  CO2 BUN CREATININE 0.66 0.75 0.82  GLUCOSE 106* 139* 196*    Electrolytes  Recent Labs Lab 01/29/17 0703 01/30/17 0621 02/04/17 1001  CALCIUM 8.7* 8.6* 9.8    CBC  Recent Labs Lab 01/29/17 0703 02/04/17 1001  WBC 9.1 16.2*  HGB 9.5* 11.2*  HCT 29.4* 35.0*  PLT 190 321    Coag's No results for input(s): APTT, INR in the last 168 hours.  Sepsis Markers  Recent Labs Lab 02/04/17 1021  LATICACIDVEN 1.94*    ABG  Recent Labs Lab 02/04/17 1045  PHART 7.502*  PCO2ART 36.4  PO2ART 236.0*    Liver Enzymes  Recent Labs Lab 02/04/17 1001  AST 21  ALT 18  ALKPHOS 99  BILITOT 2.4*  ALBUMIN 3.7    Cardiac Enzymes No results for input(s): TROPONINI, PROBNP in the last 168 hours.  Glucose  Recent Labs Lab 02/01/17 2132 02/02/17 0723 02/02/17 1149 02/02/17 1722 02/02/17 2222 02/04/17 0947  GLUCAP 101* 105* 115* 109* 168* 193*    Imaging Ct Head Wo Contrast  Result Date: 02/04/2017 CLINICAL DATA:  Unresponsive.  Multiple falls. EXAM: CT HEAD WITHOUT CONTRAST TECHNIQUE: Contiguous axial images were obtained from the base of the skull through the vertex without intravenous contrast. COMPARISON:  01/28/2017 FINDINGS: Brain: There is a large intraparenchymal hemorrhage involving the right parietal lobe and right occipital lobe. This hemorrhage is causing 1.7 cm of right to left midline shift. There is blood in the right lateral  ventricle particularly at the right temporal horn. Small amount of blood in the posterior left lateral ventricle. No significant dilatation of the ventricles. There is some compression of the third ventricle. This large intraparenchymal hemorrhage measures roughly 7.4 x 6.2 x 7.4 cm. Low-density edema surrounding this large hemorrhage. There is also a small amount of intracranial blood at the base of the left frontal lobe. Patient has chronic encephalomalacia in the bilateral frontal lobes. There may be a small amount of subdural blood along the falx. Vascular: No hyperdense vessel or unexpected calcification. Skull: No acute bone abnormality.  No calvarial fracture. Sinuses/Orbits: Fluid in nasopharynx and nasal cavity. No significant disease in the visualized paranasal sinuses. Other: Patient is intubated. IMPRESSION: Large intracranial hemorrhage centered in the right parietal lobe. 1.7 cm of midline shift with intraventricular blood. Small amount of hemorrhage in the left frontal  lobe and a small amount of subdural blood. Critical Value/emergent results were called by telephone at the time of interpretation on 02/04/2017 at 11:10 am to Dr. Shaune Pollack , who verbally acknowledged these results. Electronically Signed   By: Richarda Overlie M.D.   On: 02/04/2017 11:19   Dg Chest Portable 1 View  Result Date: 02/04/2017 CLINICAL DATA:  Intubation. EXAM: PORTABLE CHEST 1 VIEW COMPARISON:  None. FINDINGS: Heart size is mildly enlarged. Endotracheal tube tip is 4 cm above the carina. Nasogastric tube enters the stomach. The left lung is clear. There is right upper lobe infiltrate and collapse. Right lower lung is clear. No effusions. No acute bone finding. Previous rib reconstructions on the left. IMPRESSION: Right upper lobe infiltrate and collapse. Endotracheal tube tip 4 cm above the carina. Nasogastric tube in the stomach. Electronically Signed   By: Paulina Fusi M.D.   On: 02/04/2017 10:14     STUDIES:     CULTURES:   ANTIBIOTICS: Vancomycin and Zosyn started on 10/6  SIGNIFICANT EVENTS: Intubated for airway protection 10/6  LINES/TUBES: Subclavian triple-lumen placed 10/6  DISCUSSION: This is a 61 year old alcoholic with a history of recent falls who is normally on Xarelto because of atrial fibrillation. He presented with altered mental status and fever. He was found to have a very large right parietal intraparenchymal hemorrhage with extension into the ventricles and a 1.5 cm midline shift. Initially he had a right upper lobe infiltrate which now involves the entire right lung.  ASSESSMENT / PLAN:  PULMONARY A: He has what is likely an aspiration pneumonia which now involves the entire right lung and there is volume loss on the most recent x-ray suggesting plugging of the right mainstem. I will be doing a lavage to relieve the obstruction. His been covered with accommodation of vancomycin and Zosyn as he is recently been hospitalized.   CARDIOVASCULAR A: history of atrial fibrillation.   INFECTIOUS A:  He hasn't overt right-sided pneumonia. The film obtained for line placement, the entire right lung is now involved and there is volume loss suggesting obstruction.  P:   I placed him on a combination of vancomycin and Zosyn as he has been recently hospitalized.  ENDOCRINE A:  Type 2 diabetes   P:   Sliding scale insulin has been ordered  NEUROLOGIC A:  The patient is a very large right parietal hemorrhage with extension into the ventricular system. There is a 1.5 cm midline shift. P:  His Xarelto will be imperfectly reversed with K centra. I am at least correcting his hyponatremia in an attempt to control edema around this very large hemorrhage. Neurosurgery is aware of the patient considering the possibility of an EVD. I will be keeping his systolic blood pressure below 161 and attempting to limit his temperatures.   FAMILY  - Updates: I spoke with his brother Jell-O  and explained that he has a neurological insult that we'll likely render him a major neurological deficit if he survives this episode. He feels his brother would not likely wish to be supported in such a circumstance, however he is not the next of kin. The next of kin is his daughter who he is attempting to contact to help [place] make plans in keeping with the patient's desires.  Greater than 35 minutes was spent in the care of this patient today not counting time performing procedures.  Penny Pia, MD Pulmonary and Critical Care Medicine [facility] Pager: [phone]  02/04/2017, 1:19 PM

## 2017-02-04 NOTE — Procedures (Signed)
Central Line Placement  A central line was placed for administration of hypertonic saline and anticoagulant reversal agents. At the time of line placement we had no surrogate for the patient, therefore the procedure was performed as medically indicated. After performing a timeout the area over the left subclavian was prepped with chlorhexidine and widely draped. Sterile carb was donned and the left subclavian vein easily cannulated. A wire was gently advanced through the needle and the tract dilated. A 20 cm 7 French triple-lumen catheter was placed to 19 cm. There was good flow from all ports. The catheter was sutured in place and a sterile dressing applied. X-ray after the procedure suggests that the line may traverse the innominate vein and go into the right subclavian. There is now a complete collapse of the right lung, there is no pneumothorax on the left.

## 2017-02-04 NOTE — Consult Note (Addendum)
Neurology Consultation  Reason for Consult: ICH Referring Physician: Dr. Erma Heritage ER and Dr. Wallace Cullens critical care medicine  CC: Unresponsiveness  History is obtained from: Chart  HPI: Stuart Mendoza is a 61 y.o. male with a past medical history of atrial fibrillation on XARELTO, hypertension, alcohol abuse and recent history of frequent falls, discharged on 02/02/2017 after having a long hospital course for acute metabolic encephalopathy secondary to alcohol withdrawal, was in skilled nursing facility for rehabilitation where he had increasing confusion over the last 2 days, and had increased work of breathing and brought back to the ER for evaluation of those. A noncontrast CT scan of the head was done that showed a large right parietal intraparenchymal hemorrhage, with IVH, with 1.7 cm midline shift and hydrocephalus. He was a GCS of 4 on arrival. At the time of my examination, he had this been intubated little while ago and had sedatives and paralytics on board.   LKW: 48 hours ago tpa given?: no, ICH Premorbid modified Rankin scale (mRS): 3  ICH Score:  4  ROS:  Unable to obtain due to altered mental status.   Past Medical History:  Diagnosis Date  . Atrial fibrillation (HCC)   . COPD (chronic obstructive pulmonary disease) (HCC)   . Diabetes mellitus without complication (HCC)   . Hypertension     No family history on file.   Social History:   reports that he has been smoking.  He has never used smokeless tobacco. He reports that he drinks alcohol. He reports that he does not use drugs.  Medications  Current Facility-Administered Medications:  .  acetaminophen (TYLENOL) tablet 650 mg, 650 mg, Oral, Q4H PRN, Lynnell Jude, MD .  etomidate (AMIDATE) injection, , Intravenous, PRN, Shaune Pollack, MD, 20 mg at 02/04/17 [id number] .  famotidine (PEPCID) IVPB 20 mg premix, 20 mg, Intravenous, Q12H, Lynnell Jude, MD .  fentaNYL (SUBLIMAZE) injection 100 mcg, 100 mcg, Intravenous,  Q15 min PRN, Shaune Pollack, MD .  fentaNYL (SUBLIMAZE) injection 100 mcg, 100 mcg, Intravenous, Q2H PRN, Shaune Pollack, MD .  insulin aspart (novoLOG) injection 0-15 Units, 0-15 Units, Subcutaneous, TID WC, Lynnell Jude, MD .  labetalol (NORMODYNE,TRANDATE) injection 10 mg, 10 mg, Intravenous, Q4H PRN, Lynnell Jude, MD .  magnesium sulfate IVPB 2 g 50 mL, 2 g, Intravenous, Once, Lynnell Jude, MD .  nicardipine (CARDENE)  in 0.86% saline IV infusion (0.1 mg/ml), 3-15 mg/hr, Intravenous, Once, Shaune Pollack, MD, Stopped at 02/04/17 1115 .  ondansetron (ZOFRAN) injection 4 mg, 4 mg, Intravenous, Q6H PRN, Lynnell Jude, MD .  piperacillin-tazobactam (ZOSYN) IVPB 3.375 g, 3.375 g, Intravenous, Q8H, Dang, Thuy D, RPH .  potassium chloride 10 mEq in 50 mL *CENTRAL LINE* IVPB, 10 mEq, Intravenous, Q1 Hr x 2, Lynnell Jude, MD .  propofol (DIPRIVAN) 1000 MG/100ML infusion, 0-50 mcg/kg/min, Intravenous, Continuous, Shaune Pollack, MD, Last Rate: 3.3 mL/hr at 02/04/17 1247, 5 mcg/kg/min at 02/04/17 1247 .  rocuronium (ZEMURON) injection, , Intravenous, PRN, Shaune Pollack, MD, 100 mg at 02/04/17 0952 .  sodium chloride (hypertonic) 3 % solution, , Intravenous, Continuous, Lynnell Jude, MD .  sodium chloride 23.4 % (4 mEq/mL) IV in VIAFLEX BAG, 30 mL, Intravenous, Once, Lynnell Jude, MD .  sodium phosphate 10 mmol in dextrose 5 % 250 mL infusion, 10 mmol, Intravenous, Once, Lynnell Jude, MD .  thiamine (B-1) injection 100 mg, 100 mg, Intravenous, Daily, Lynnell Jude, MD .  Melene Muller ON  02/05/2017] vancomycin (VANCOCIN) IVPB 1000 mg/200 mL premix, 1,000 mg, Intravenous, Q12H, Dang, Thuy D, [facility]  Current Outpatient Prescriptions:  .  amLODipine (NORVASC) 10 MG tablet, Take 10 mg by mouth daily., Disp: , Rfl: 3 .  aspirin EC 81 MG tablet, Take 81 mg by mouth daily., Disp: , Rfl:  .  chlorhexidine (PERIDEX) 0.12 % solution, 15 mLs by Mouth Rinse route 2 (two) times daily., Disp: 120  mL, Rfl: 0 .  fluticasone (FLONASE) 50 MCG/ACT nasal spray, Place 2 sprays into both nostrils daily., Disp: , Rfl: 5 .  folic acid (FOLVITE) 1 MG tablet, Take 1 tablet (1 mg total) by mouth daily., Disp: , Rfl:  .  HYDROcodone-acetaminophen (NORCO) 10-325 MG tablet, Take 1 tablet by mouth 4 (four) times daily., Disp: 10 tablet, Rfl: 0 .  JANUVIA 100 MG tablet, Take 100 mg by mouth daily., Disp: , Rfl: 3 .  labetalol (NORMODYNE) 100 MG tablet, Take 1 tablet (100 mg total) by mouth 2 (two) times daily., Disp: , Rfl:  .  lisinopril (PRINIVIL,ZESTRIL) 20 MG tablet, Take 20 mg by mouth daily., Disp: , Rfl: 3 .  Multiple Vitamin (MULTIVITAMIN WITH MINERALS) TABS tablet, Take 1 tablet by mouth daily., Disp: , Rfl:  .  nicotine (NICODERM CQ - DOSED IN MG/24 HOURS) 14 mg/24hr patch, Place 1 patch (14 mg total) onto the skin daily., Disp: 28 patch, Rfl: 0 .  omeprazole (PRILOSEC) 40 MG capsule, Take 40 mg by mouth daily., Disp: , Rfl: 3 .  polyvinyl alcohol (LIQUIFILM TEARS) 1.4 % ophthalmic solution, Place 1 drop into both eyes as needed for dry eyes., Disp: 15 mL, Rfl: 0 .  PROAIR HFA 108 (90 Base) MCG/ACT inhaler, Place 2 puffs into alternate nostrils every 4 (four) hours as needed for wheezing or shortness of breath., Disp: , Rfl: 5 .  SPIRIVA HANDIHALER 18 MCG inhalation capsule, Place 18 mcg into inhaler and inhale daily., Disp: , Rfl: 3 .  XARELTO 20 MG TABS tablet, Take 20 mg by mouth daily., Disp: , Rfl: 3  Exam: Current vital signs: BP 116/85   Pulse 98   Temp (!) 101.1 F (38.4 C) (Rectal)   Resp 18   Ht 6' (1.829 m)   SpO2 100%  Vital signs in last 24 hours: Temp:  [101.1 F (38.4 C)] 101.1 F (38.4 C) (10/06 1030) Pulse Rate:  [98-120] 98 (10/06 1130) Resp:  [18-28] 18 (10/06 1245) BP: (103-214)/(61-136) 116/85 (10/06 1245) SpO2:  [100 %] 100 % (10/06 1245) FiO2 (%):  [100 %] 100 % (10/06 1000) Gen.: Sedated intubated HEENT: Normocephalic nontraumatic, moist oral mucous  membranes, ET tube in place, clear nares CVS: S1-S2 heard, irregularly irregular Respiratory: Breathing with the vent, scattered rales, decreased breath sounds over the right apex Extremity: Pulses palpable although over, warm well perfused Neurological exam Sedated intubated Cannot test speech because of above Cranial nerves: Pupils pinpoint, no gaze preference, corneals present, facial symmetry difficult to ascertain. Motor exam:No spontaneous movement noted Sensory exam: No response to noxious stimulus on upper extremitis. Mild withdrawal to noxious stim on lower extremities Coordination and gait cannot be tested Plantars were mute. DTRs 1+ all over.  Labs I have reviewed labs in epic and the results pertinent to this consultation are:  CBC    Component Value Date/Time   WBC 16.2 (H) 02/04/2017 1001   RBC 4.09 (L) 02/04/2017 1001   HGB 11.2 (L) 02/04/2017 1001   HCT 35.0 (L) 02/04/2017 1001   PLT  321 02/04/2017 1001   MCV 85.6 02/04/2017 1001   MCH 27.4 02/04/2017 1001   MCHC 32.0 02/04/2017 1001   RDW 16.2 (H) 02/04/2017 1001   LYMPHSABS 1.1 02/04/2017 1001   MONOABS 0.8 02/04/2017 1001   EOSABS 0.0 02/04/2017 1001   BASOSABS 0.0 02/04/2017 1001    CMP     Component Value Date/Time   NA 133 (L) 02/04/2017 1001   K 3.4 (L) 02/04/2017 1001   CL 94 (L) 02/04/2017 1001   CO2 25 02/04/2017 1001   GLUCOSE 196 (H) 02/04/2017 1001   BUN 12 02/04/2017 1001   CREATININE 0.82 02/04/2017 1001   CALCIUM 9.8 02/04/2017 1001   PROT 7.5 02/04/2017 1001   ALBUMIN 3.7 02/04/2017 1001   AST 21 02/04/2017 1001   ALT 18 02/04/2017 1001   ALKPHOS 99 02/04/2017 1001   BILITOT 2.4 (H) 02/04/2017 1001   GFRNONAA >60 02/04/2017 1001   GFRAA >60 02/04/2017 1001    Lipid Panel     Component Value Date/Time   TRIG 55 02/04/2017 1001     Imaging I have reviewed the images obtained:  CT-scan of the brain - large intraparenchymal hematoma in the right parietal area, roughly about  149 mL, 1.7 cm midline shift, extension into the right lateral ventricle and also the posterior horn of the left lateral ventricle. The abdomen is occluding the right lateral ventricle and producing hydrocephalus as evidenced by ballooning of the temporal horn of the right lateral horn greater than the left lateral ventricle.The fourth ventricle is open.  Assessment:  61 year old man who was on xarelto for atrial fibrillation, presented for increasing confusion for the past 2 days. On arrival, his GCS was 4. He required emergent intubation for every protection. CT scan of the head revealed a large intraparenchymal hematoma with IVH and midline shift in the right parietal lobe. 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 Use Cardene with rapid titration. Neurosurgical consultation for EVD. He is probably not a good candidate for evacuation given poor baseline, but his exam is poor and he is developing hydrocephalus. No antiplatelet or anticoagulant should be prescribed at this time. Family has not been able to be reached. Dr. Erma Heritage is contacted the facility twice to get in touch with family and has now been successful yet. Poor prognosis due to ICH score 4 at presentation. ICH score of 4 has been associated with nearly 100% mortality at the end of 30 days.  Stroke service will follow with you.  -- Milon Dikes, MD [facility] [phone]  If 7pm to 7am, please call on call as listed on AMION.  Critical care attestation This patient is critically ill and at significant risk of neurological worsening, death and care requires constant monitoring of vital signs, hemodynamics,respiratory and cardiac  monitoring, neurological assessment, discussion with family, other specialists and medical decision making of high complexity. I spent 30  minutes of neurocritical care time  in the care of  this patient.

## 2017-02-04 NOTE — ED Provider Notes (Signed)
[facility] DEPT Provider Note   CSN: [id number] Arrival date & time: 02/04/17  2376     History   Chief Complaint Chief Complaint  Patient presents with  . Altered Mental Status    HPI Vaiden Adames is a 61 y.o. male.  HPI   61 year old male with past medical history of COPD, A. Fib on 02, diabetes, who presents with altered mental status. History is provided by EMS. According to report from the facility, the patient has been increasingly confused for the last 48 hours. The patient refuses medications yesterday. He was found today acutely more confused with increased work of breathing. On my assessment, the patient is altered and unable to provide history. Per EMS, pt has been in AFib RVR en route, hypertensive and tachypneic.  Level 5 caveat invoked as remainder of history, ROS, and physical exam limited due to patient's ams.   Past Medical History:  Diagnosis Date  . Atrial fibrillation ([place])   . COPD (chronic obstructive pulmonary disease) ([place])   . Diabetes mellitus without complication ([place])   . Hypertension     Patient Active Problem List   Diagnosis Date Noted  . ICH (intracerebral hemorrhage) ([place]) 02/04/2017  . Unspecified atrial fibrillation ([place]) 02/02/2017  . Type 2 diabetes mellitus without complication ([place]) 28/31/5176  . Hypokalemia 02/02/2017  . COPD (chronic obstructive pulmonary disease) ([place]) 02/02/2017  . Obesity (BMI 30-39.9) 02/02/2017  . Withdrawal syndrome ([place]) 01/28/2017  . Acute metabolic encephalopathy 16/10/3708  . Hyponatremia 01/28/2017  . Accelerated hypertension 01/28/2017    Past Surgical History:  Procedure Laterality Date  . APPENDECTOMY    . TONSILLECTOMY         Home Medications    Prior to Admission medications   Medication Sig Start Date End Date Taking? Authorizing Provider  albuterol (PROVENTIL HFA;VENTOLIN HFA) 108 (90 Base) MCG/ACT inhaler Inhale 2 puffs into the lungs every 4 (four) hours as needed for  wheezing. 03/22/13  Yes [provider]  amLODipine (NORVASC) 10 MG tablet Take 10 mg by mouth daily. 12/22/16  Yes [provider]  aspirin EC 81 MG tablet Take 81 mg by mouth daily.   Yes [provider]  chlorhexidine (PERIDEX) 0.12 % solution 15 mLs by Mouth Rinse route 2 (two) times daily. 02/02/17  Yes Rama, Venetia Maxon, MD  fluticasone (FLONASE) 50 MCG/ACT nasal spray Place 2 sprays into both nostrils daily. 01/17/17  Yes [provider]  folic acid (FOLVITE) 1 MG tablet Take 1 tablet (1 mg total) by mouth daily. 02/03/17  Yes Rama, Venetia Maxon, MD  HYDROcodone-acetaminophen (NORCO) 10-325 MG tablet Take 1 tablet by mouth 4 (four) times daily. 02/02/17  Yes Rama, Venetia Maxon, MD  JANUVIA 100 MG tablet Take 100 mg by mouth daily. 12/23/16  Yes [provider]  labetalol (NORMODYNE) 100 MG tablet Take 1 tablet (100 mg total) by mouth 2 (two) times daily. 02/02/17  Yes Rama, Venetia Maxon, MD  lisinopril (PRINIVIL,ZESTRIL) 20 MG tablet Take 20 mg by mouth daily. 12/22/16  Yes [provider]  Multiple Vitamin (MULTIVITAMIN WITH MINERALS) TABS tablet Take 1 tablet by mouth daily. 02/03/17  Yes Rama, Venetia Maxon, MD  nicotine (NICODERM CQ - DOSED IN MG/24 HOURS) 14 mg/24hr patch Place 1 patch (14 mg total) onto the skin daily. 02/03/17  Yes Rama, Venetia Maxon, MD  omeprazole (PRILOSEC) 20 MG capsule Take 20 mg by mouth daily.  12/22/16  Yes [provider]  SPIRIVA HANDIHALER 18 MCG inhalation capsule Place  18 mcg into inhaler and inhale daily. 12/23/16  Yes [provider]  XARELTO 20 MG TABS tablet Take 20 mg by mouth daily. 12/26/16  Yes [provider]  polyvinyl alcohol (LIQUIFILM TEARS) 1.4 % ophthalmic solution Place 1 drop into both eyes as needed for dry eyes. Patient not taking: Reported on 02/04/2017 02/02/17   Rama, Venetia Maxon, MD    Family History No family history on file.  Social History Social History  Substance  Use Topics  . Smoking status: Current Every Day Smoker  . Smokeless tobacco: Never Used  . Alcohol use Yes     Allergies   Metformin and related   Review of Systems Review of Systems  Unable to perform ROS: Patient unresponsive     Physical Exam Updated Vital Signs BP (!) 153/83 (BP Location: Left Arm)   Pulse (!) 102   Temp 98.5 F (36.9 C) (Axillary)   Resp (!) 28   Ht _0  (1.778 m)   Wt 92.6 kg (204 lb 2.3 oz)   SpO2 100%   BMI 29.29 kg/m   Physical Exam  Constitutional: He appears well-developed. He appears distressed.  Distress, disheveled, appears older than stated age in obvious respiratory distress  HENT:  Head: Normocephalic.  Superficial abrasion and ecchymosis to right forehead with bruising around bilateral eyes  Eyes:  Pupils constricted but symmetric bilaterally and reactive to light  Neck: Neck supple.  Cardiovascular: Normal heart sounds.  Exam reveals no friction rub.   No murmur heard. Tachycardic, irregularly irregular  Pulmonary/Chest: He is in respiratory distress.  Markedly increased work of breathing with rhonchi throughout right greater than left lung fields.  Abdominal: Soft. He exhibits no distension.  Large midline scar, well-healed  Musculoskeletal: He exhibits no edema.  Scattered ecchymosis to bilateral upper extremities  Neurological: He is unresponsive. GCS eye subscore is 1. GCS verbal subscore is 2. GCS motor subscore is 1.  Skin: Skin is warm. Capillary refill takes less than 2 seconds.  Nursing note and vitals reviewed.    ED Treatments / Results  Labs (all labs ordered are listed, but only abnormal results are displayed) Labs Reviewed  COMPREHENSIVE METABOLIC PANEL - Abnormal; Notable for the following:       Result Value   Sodium 133 (*)    Potassium 3.4 (*)    Chloride 94 (*)    Glucose, Bld 196 (*)    Total Bilirubin 2.4 (*)    All other components within normal limits  CBC WITH DIFFERENTIAL/PLATELET -  Abnormal; Notable for the following:    WBC 16.2 (*)    RBC 4.09 (*)    Hemoglobin 11.2 (*)    HCT 35.0 (*)    RDW 16.2 (*)    Neutro Abs 14.3 (*)    All other components within normal limits  URINALYSIS, ROUTINE W REFLEX MICROSCOPIC - Abnormal; Notable for the following:    Glucose, UA 150 (*)    Ketones, ur 20 (*)    Protein, ur 30 (*)    Squamous Epithelial / LPF 0-5 (*)    All other components within normal limits  ACETAMINOPHEN LEVEL - Abnormal; Notable for the following:    Acetaminophen (Tylenol), Serum <10 (*)    All other components within normal limits  BASIC METABOLIC PANEL - Abnormal; Notable for the following:    Potassium 3.2 (*)    Glucose, Bld 174 (*)    Calcium 8.4 (*)    All other components within normal  limits  BASIC METABOLIC PANEL - Abnormal; Notable for the following:    Potassium 2.9 (*)    Glucose, Bld 169 (*)    Calcium 8.6 (*)    All other components within normal limits  HEMOGLOBIN A1C - Abnormal; Notable for the following:    Hgb A1c MFr Bld 5.7 (*)    All other components within normal limits  GLUCOSE, CAPILLARY - Abnormal; Notable for the following:    Glucose-Capillary 163 (*)    All other components within normal limits  I-STAT CG4 LACTIC ACID, ED - Abnormal; Notable for the following:    Lactic Acid, Venous 1.94 (*)    All other components within normal limits  I-STAT TROPONIN, ED - Abnormal; Notable for the following:    Troponin i, poc 0.09 (*)    All other components within normal limits  I-STAT VENOUS BLOOD GAS, ED - Abnormal; Notable for the following:    pH, Ven 7.530 (*)    pCO2, Ven 34.0 (*)    pO2, Ven 46.0 (*)    Bicarbonate 28.4 (*)    Acid-Base Excess 6.0 (*)    All other components within normal limits  CBG MONITORING, ED - Abnormal; Notable for the following:    Glucose-Capillary 193 (*)    All other components within normal limits  I-STAT ARTERIAL BLOOD GAS, ED - Abnormal; Notable for the following:    pH, Arterial  7.502 (*)    pO2, Arterial 236.0 (*)    Bicarbonate 28.6 (*)    Acid-Base Excess 5.0 (*)    All other components within normal limits  POCT I-STAT 3, ART BLOOD GAS (G3+) - Abnormal; Notable for the following:    pH, Arterial 7.484 (*)    pO2, Arterial 81.0 (*)    All other components within normal limits  MRSA PCR SCREENING  CULTURE, BLOOD (ROUTINE X 2)  CULTURE, BLOOD (ROUTINE X 2)  URINE CULTURE  CULTURE, RESPIRATORY (NON-EXPECTORATED)  AMMONIA  TRIGLYCERIDES  ETHANOL  SALICYLATE LEVEL  PROTIME-INR  PROTIME-INR  BASIC METABOLIC PANEL  SODIUM  BLOOD GAS, ARTERIAL  PROTIME-INR  CBC  BASIC METABOLIC PANEL  BLOOD GAS, ARTERIAL  MAGNESIUM  PHOSPHORUS  BASIC METABOLIC PANEL  PROTIME-INR  I-STAT CG4 LACTIC ACID, ED    EKG  EKG Interpretation  Date/Time:  Saturday February 04 2017 09:40:41 EDT Ventricular Rate:  98 PR Interval:    QRS Duration: 101 QT Interval:  375 QTC Calculation: 479 R Axis:   75 Text Interpretation:  Atrial fibrillation Paired ventricular premature complexes Borderline repolarization abnormality Borderline prolonged QT interval No significant change since last tracing Confirmed by Duffy Bruce [id number]) on 02/04/2017 10:01:38 AM       Radiology Ct Head Wo Contrast  Result Date: 02/04/2017 CLINICAL DATA:  Follow-up intracranial hemorrhage EXAM: CT HEAD WITHOUT CONTRAST TECHNIQUE: Contiguous axial images were obtained from the base of the skull through the vertex without intravenous contrast. COMPARISON:  02/04/2017 @ 1058 hours FINDINGS: Brain: Large stable 7.3 x 5.8 x 7.4 cm intraparenchymal hematoma in the right parietal lobe with intraventricular rupture and surrounding vasogenic edema. Small amount of blood dependently in the occipital horn of the left lateral ventricle. Complete effacement of the occipital horn of the right lateral ventricle with hemorrhage in the right lateral with mild stable dilatation of the right temporal horn. Severe mass  effect with right to left midline shift measuring 17 mm similar in appearance to the earlier examination. Small intraparenchymal hemorrhage in the inferior left frontal lobe.  Bilateral frontal lobe encephalomalacia. Generalized cerebral atrophy. Periventricular white matter low attenuation likely secondary to microangiopathy. Vascular: Cerebrovascular atherosclerotic calcifications are noted. Skull: Negative for fracture or focal lesion. Sinuses/Orbits: Visualized portions of the orbits are unremarkable. Visualized portions of the paranasal sinuses and mastoid air cells are unremarkable. Other: None. IMPRESSION: 1. Stable large 7.3 x 5.8 x 7.4 cm intraparenchymal hemorrhage in the right parietal lobe with intraventricular rupture. Stable persistent 17 mm of right to left midline shift. Persistent mild dilatation of the temporal horn of the right lateral ventricle without significant hydrocephalus. Electronically Signed   By: Kathreen Devoid   On: 02/04/2017 17:43   Ct Head Wo Contrast  Result Date: 02/04/2017 CLINICAL DATA:  Unresponsive.  Multiple falls. EXAM: CT HEAD WITHOUT CONTRAST TECHNIQUE: Contiguous axial images were obtained from the base of the skull through the vertex without intravenous contrast. COMPARISON:  01/28/2017 FINDINGS: Brain: There is a large intraparenchymal hemorrhage involving the right parietal lobe and right occipital lobe. This hemorrhage is causing 1.7 cm of right to left midline shift. There is blood in the right lateral ventricle particularly at the right temporal horn. Small amount of blood in the posterior left lateral ventricle. No significant dilatation of the ventricles. There is some compression of the third ventricle. This large intraparenchymal hemorrhage measures roughly 7.4 x 6.2 x 7.4 cm. Low-density edema surrounding this large hemorrhage. There is also a small amount of intracranial blood at the base of the left frontal lobe. Patient has chronic encephalomalacia in the  bilateral frontal lobes. There may be a small amount of subdural blood along the falx. Vascular: No hyperdense vessel or unexpected calcification. Skull: No acute bone abnormality.  No calvarial fracture. Sinuses/Orbits: Fluid in nasopharynx and nasal cavity. No significant disease in the visualized paranasal sinuses. Other: Patient is intubated. IMPRESSION: Large intracranial hemorrhage centered in the right parietal lobe. 1.7 cm of midline shift with intraventricular blood. Small amount of hemorrhage in the left frontal lobe and a small amount of subdural blood. Critical Value/emergent results were called by telephone at the time of interpretation on 02/04/2017 at 11:10 am to Dr. Duffy Bruce , who verbally acknowledged these results. Electronically Signed   By: Markus Daft M.D.   On: 02/04/2017 11:19   Dg Chest Port 1 View  Result Date: 02/04/2017 CLINICAL DATA:  Intracranial hemorrhage.  Right lung lavage. EXAM: PORTABLE CHEST 1 VIEW COMPARISON:  One-view chest x-ray from the same day at 12:50 p.m. FINDINGS: The patient remains intubated. The endotracheal tube terminates 5.1 cm above the carina. A left subclavian line is stable. NG tube courses off the inferior border of film. There is significant re-expansion of the right lung. Residual airspace disease is noted at the base. Left lung remains clear. IMPRESSION: 1. Near complete re-expansion of the right lung with some residual airspace disease at the right base. 2. The support apparatus is stable. Electronically Signed   By: San Morelle M.D.   On: 02/04/2017 15:07   Dg Chest Port 1 View  Result Date: 02/04/2017 CLINICAL DATA:  Intracerebral hemorrhage. EXAM: PORTABLE CHEST 1 VIEW COMPARISON:  Chest x-ray from same day at 1001. FINDINGS: Endotracheal tube in place with tip approximately 4.1 cm above the level of the carina. Enteric tube entering the stomach with tip below the field of view. Stable cardiomegaly. New complete opacification of the  right hemithorax with rightward shift of the mediastinum. The left lung is clear. No pneumothorax. No acute osseous abnormality. IMPRESSION: 1. New complete opacification  of the right hemithorax with volume loss, which could reflect large central mucous plugging and collapse, and/or new pleural effusion. 2. Appropriate positioning of the endotracheal tube. Electronically Signed   By: Titus Dubin M.D.   On: 02/04/2017 13:29   Dg Chest Portable 1 View  Result Date: 02/04/2017 CLINICAL DATA:  Intubation. EXAM: PORTABLE CHEST 1 VIEW COMPARISON:  None. FINDINGS: Heart size is mildly enlarged. Endotracheal tube tip is 4 cm above the carina. Nasogastric tube enters the stomach. The left lung is clear. There is right upper lobe infiltrate and collapse. Right lower lung is clear. No effusions. No acute bone finding. Previous rib reconstructions on the left. IMPRESSION: Right upper lobe infiltrate and collapse. Endotracheal tube tip 4 cm above the carina. Nasogastric tube in the stomach. Electronically Signed   By: Nelson Chimes M.D.   On: 02/04/2017 10:14    Procedures .Critical Care Performed by: Duffy Bruce Authorized by: Duffy Bruce   Critical care provider statement:    Critical care time (minutes):  74   Critical care time was exclusive of:  Separately billable procedures and treating other patients and teaching time   Critical care was necessary to treat or prevent imminent or life-threatening deterioration of the following conditions:  CNS failure or compromise, sepsis, circulatory failure and respiratory failure   Critical care was time spent personally by me on the following activities:  Development of treatment plan with patient or surrogate, discussions with consultants, evaluation of patient's response to treatment, examination of patient, obtaining history from patient or surrogate, ordering and performing treatments and interventions, ordering and review of laboratory studies,  ordering and review of radiographic studies, pulse oximetry, re-evaluation of patient's condition and review of old charts   I assumed direction of critical care for this patient from another provider in my specialty: no   Procedure Name: Intubation Date/Time: 02/04/2017 10:27 AM Performed by: Duffy Bruce Pre-anesthesia Checklist: Patient identified, Patient being monitored, Emergency Drugs available, Timeout performed and Suction available Oxygen Delivery Method: Non-rebreather mask Preoxygenation: Pre-oxygenation with 100% oxygen Induction Type: Rapid sequence Ventilation: Mask ventilation without difficulty Laryngoscope Size: 4 and Mac Grade View: Grade I Tube size: 7.5 mm Number of attempts: 1 Placement Confirmation: ETT inserted through vocal cords under direct vision,  CO2 detector and Breath sounds checked- equal and bilateral Secured at: 23 cm Tube secured with: ETT holder Dental Injury: Teeth and Oropharynx as per pre-operative assessment  Future Recommendations: Recommend- induction with short-acting agent, and alternative techniques readily available Comments: Large amount of thick, yellow-green secretions noted in oropharynx and expelled from trachea.      (including critical care time)  Medications Ordered in ED Medications  etomidate (AMIDATE) injection (20 mg Intravenous Given 02/04/17 0952)  rocuronium (ZEMURON) injection (100 mg Intravenous Given 02/04/17 0952)  fentaNYL (SUBLIMAZE) injection 100 mcg (not administered)  fentaNYL (SUBLIMAZE) injection 100 mcg (not administered)  propofol (DIPRIVAN) 1000 MG/100ML infusion (15 mcg/kg/min  109.3 kg Intravenous New Bag/Given 02/04/17 2012)  nicardipine (CARDENE) 29m in 0.86% saline 2023mIV infusion (0.1 mg/ml) (0 mg/hr Intravenous Hold 02/04/17 1115)  sodium chloride (hypertonic) 3 % solution (40 mL/hr Intravenous New Bag/Given 02/04/17 1400)  labetalol (NORMODYNE,TRANDATE) injection 10 mg (not administered)  thiamine  (B-1) injection 100 mg (100 mg Intravenous Given 02/04/17 1521)  famotidine (PEPCID) IVPB 20 mg premix (0 mg Intravenous Stopped 02/04/17 1723)  acetaminophen (TYLENOL) tablet 650 mg (not administered)  ondansetron (ZOFRAN) injection 4 mg (not administered)  vancomycin (VANCOCIN) IVPB 1000 mg/200 mL premix (  not administered)  piperacillin-tazobactam (ZOSYN) IVPB 3.375 g (3.375 g Intravenous New Bag/Given 02/04/17 2016)  albuterol (PROVENTIL) (2.5 MG/3ML) 0.083% nebulizer solution 2.5 mg (2.5 mg Nebulization Given 02/04/17 2026)  sodium chloride HYPERTONIC 3 % nebulizer solution 4 mL (4 mLs Nebulization Given 02/04/17 2030)  chlorhexidine gluconate (MEDLINE KIT) (PERIDEX) 0.12 % solution 15 mL (15 mLs Mouth Rinse Given 02/04/17 2000)  MEDLINE mouth rinse (15 mLs Mouth Rinse Given 02/04/17 1854)  insulin aspart (novoLOG) injection 0-15 Units (3 Units Subcutaneous Given 02/04/17 2043)  naloxone (NARCAN) 0.4 MG/ML injection (0.4 mg  Given 02/04/17 0945)  propofol (DIPRIVAN) 1000 MG/100ML infusion (  Stopped 02/04/17 1045)  piperacillin-tazobactam (ZOSYN) IVPB 3.375 g (0 g Intravenous Stopped 02/04/17 1055)  vancomycin (VANCOCIN) 2,000 mg in sodium chloride 0.9 % 500 mL IVPB (2,000 mg Intravenous Restarted 02/04/17 1246)  sodium chloride 0.9 % bolus 1,000 mL (0 mLs Intravenous Stopped 02/04/17 1134)  acetaminophen (TYLENOL) suppository 650 mg (650 mg Rectal Given 02/04/17 1128)  labetalol (NORMODYNE,TRANDATE) injection 20 mg (20 mg Intravenous Given 02/04/17 1128)  prothrombin complex conc human (KCENTRA) IVPB 5,357 Units (0 Units Intravenous Stopped 02/04/17 1245)  sodium chloride 23.4 % (4 mEq/mL) IV in VIAFLEX BAG (30 mLs Intravenous New Bag/Given 02/04/17 1359)  potassium chloride 10 mEq in 50 mL *CENTRAL LINE* IVPB (0 mEq Intravenous Stopped 02/04/17 1636)  magnesium sulfate IVPB 2 g 50 mL (0 g Intravenous Stopped 02/04/17 1655)  sodium phosphate 10 mmol in dextrose 5 % 250 mL infusion (0 mmol Intravenous Stopped  02/04/17 1956)  acetylcysteine (MUCOMYST) 20 % nebulizer / oral solution 4 mL (4 mLs Nebulization Given 02/04/17 1415)     Initial Impression / Assessment and Plan / ED Course  I have reviewed the triage vital signs and the nursing notes.  Pertinent labs & imaging results that were available during my care of the patient were reviewed by me and considered in my medical decision making (see chart for details).    61 year old male here with unresponsiveness.On arrival, patient with GCS of 4. No spontaneous movement noted of bilateral upper extremities with no withdrawal to painful stimuli. Patient subsequently intubated as above. Tolerated procedure well. Given his fever and thick, purulent secretions noted on intubation, concern for sepsis secondary to pneumonia. Patient given broad-spectrum antibiotics, cultures, and fluids. He is tachycardic and hypertensive. Given the degree of his mental status change, will also obtain CT scan to evaluate for additional etiologies.  CT scan shows large intraparenchymal and intraventricular bleed. The patient has significant midline shift and concern for developing herniation. Pt given labetalol IV with good effect, SBP now 139/90s. Will have cardene gtt at bedside for goal SBP<140. KCentra given. [place], Neurology consulted and will admit to ICU. Intensivist consulted and at bedside.  Final Clinical Impressions(s) / ED Diagnoses   Final diagnoses:  Nontraumatic intracerebral hemorrhage, unspecified cerebral location, unspecified laterality ([place])  Sepsis, due to unspecified organism [facility] [address])  Aspiration pneumonia of right upper lobe, unspecified aspiration pneumonia type [facility])    New Prescriptions Current Discharge Medication List       Duffy Bruce, MD 02/04/17 2119

## 2017-02-04 NOTE — Progress Notes (Signed)
Pharmacy Antibiotic Note  Stuart Mendoza is a 61 y.o. male admitted on 02/04/2017 with AMS, found to have ICH and received KCentra.  Pharmacy has been consulted for vancomycin and Zosyn dosing for sepsis.  Patient's renal function is stable.  Tmax 101.1, WBC 16.2, LA 1.94.  Plan: Vanc 2gm IV x 1, then 1gm IV Q12H Zosyn EI 3.375gm IV Q8H Monitor renal fxn, clinical progress, vanc trough at Css Q6H Na+ while on 3%NS   Height: 6' (182.9 cm) IBW/kg (Calculated) : 77.6  Temp (24hrs), Avg:101.1 F (38.4 C), Min:101.1 F (38.4 C), Max:101.1 F (38.4 C)   Recent Labs Lab 01/29/17 0703 01/30/17 0621 02/04/17 1001 02/04/17 1021  WBC 9.1  --  16.2*  --   CREATININE 0.66 0.75 0.82  --   LATICACIDVEN  --   --   --  1.94*    Estimated Creatinine Clearance: 119.3 mL/min (by C-G formula based on SCr of 0.82 mg/dL).    Allergies  Allergen Reactions  . Metformin And Related      Vanc 10/6 >> Zosyn 10/6 >>  10/6 BCx -  10/6 UCx -    Fara Worthy D. Laney Potash, PharmD, BCPS Pager:  [phone] 02/04/2017, 1:12 PM

## 2017-02-04 NOTE — Progress Notes (Signed)
Transported pt on vent to ct scan and back

## 2017-02-04 NOTE — ED Notes (Signed)
Condom catheter applied.

## 2017-02-04 NOTE — ED Triage Notes (Signed)
Pt arrives via EMS from [facility] with AMS- LKW last night. Staff at [facility] reports pt refused medications yesterday. Responsive to painful stimuli. PT arrives tachypenic, afib RVR rate 90-120bpm. 170/120.

## 2017-02-04 NOTE — Progress Notes (Signed)
Patient with large intraparenchymal hemorrhage in right parietal lobe with intraventricular blood. Case reviewed with attending Dr Sharlet [place]ina Ditty who has also reviewed the imaging. Presently, there is no acute NS intervention indicated for crani or EVD - no hydrocephalus and foramina monro patent. He may eventually need intervention, but not acutely. Defer management to Neuro.

## 2017-02-05 ENCOUNTER — Encounter (HOSPITAL_COMMUNITY): Payer: Self-pay | Admitting: *Deleted

## 2017-02-05 DIAGNOSIS — I611 Nontraumatic intracerebral hemorrhage in hemisphere, cortical: Secondary | ICD-10-CM

## 2017-02-05 DIAGNOSIS — J988 Other specified respiratory disorders: Secondary | ICD-10-CM

## 2017-02-05 DIAGNOSIS — J690 Pneumonitis due to inhalation of food and vomit: Secondary | ICD-10-CM

## 2017-02-05 LAB — BLOOD CULTURE ID PANEL (REFLEXED)
Acinetobacter baumannii: NOT DETECTED
CANDIDA KRUSEI: NOT DETECTED
CANDIDA PARAPSILOSIS: NOT DETECTED
CANDIDA TROPICALIS: NOT DETECTED
Candida albicans: NOT DETECTED
Candida glabrata: NOT DETECTED
ENTEROCOCCUS SPECIES: NOT DETECTED
ESCHERICHIA COLI: NOT DETECTED
Enterobacter cloacae complex: NOT DETECTED
Enterobacteriaceae species: NOT DETECTED
Haemophilus influenzae: NOT DETECTED
KLEBSIELLA OXYTOCA: NOT DETECTED
Klebsiella pneumoniae: NOT DETECTED
LISTERIA MONOCYTOGENES: NOT DETECTED
Methicillin resistance: DETECTED — AB
Neisseria meningitidis: NOT DETECTED
PROTEUS SPECIES: NOT DETECTED
Pseudomonas aeruginosa: NOT DETECTED
STAPHYLOCOCCUS AUREUS BCID: NOT DETECTED
STAPHYLOCOCCUS SPECIES: DETECTED — AB
Serratia marcescens: NOT DETECTED
Streptococcus agalactiae: NOT DETECTED
Streptococcus pneumoniae: NOT DETECTED
Streptococcus pyogenes: NOT DETECTED
Streptococcus species: NOT DETECTED

## 2017-02-05 LAB — BASIC METABOLIC PANEL
ANION GAP: 6 (ref 5–15)
ANION GAP: 8 (ref 5–15)
Anion gap: 10 (ref 5–15)
BUN: 12 mg/dL (ref 6–20)
BUN: 13 mg/dL (ref 6–20)
BUN: 15 mg/dL (ref 6–20)
CALCIUM: 8.7 mg/dL — AB (ref 8.9–10.3)
CALCIUM: 8.6 mg/dL — AB (ref 8.9–10.3)
CHLORIDE: 106 mmol/L (ref 101–111)
CHLORIDE: 106 mmol/L (ref 101–111)
CO2: 25 mmol/L (ref 22–32)
CO2: 24 mmol/L (ref 22–32)
CO2: 24 mmol/L (ref 22–32)
CREATININE: 0.65 mg/dL (ref 0.61–1.24)
Calcium: 8.6 mg/dL — ABNORMAL LOW (ref 8.9–10.3)
Chloride: 115 mmol/L — ABNORMAL HIGH (ref 101–111)
Creatinine, Ser: 0.68 mg/dL (ref 0.61–1.24)
Creatinine, Ser: 0.7 mg/dL (ref 0.61–1.24)
GFR calc non Af Amer: >60 mL/min (ref >60)
GFR calc non Af Amer: >60 mL/min (ref >60)
GLUCOSE: 175 mg/dL — AB (ref 65–99)
Glucose, Bld: 147 mg/dL — ABNORMAL HIGH (ref 65–99)
Glucose, Bld: 168 mg/dL — ABNORMAL HIGH (ref 65–99)
POTASSIUM: 2.9 mmol/L — AB (ref 3.5–5.1)
POTASSIUM: 2.9 mmol/L — AB (ref 3.5–5.1)
Potassium: 2.8 mmol/L — ABNORMAL LOW (ref 3.5–5.1)
Sodium: 139 mmol/L (ref 135–145)
Sodium: 140 mmol/L (ref 135–145)
Sodium: 145 mmol/L (ref 135–145)

## 2017-02-05 LAB — PROTIME-INR
INR: 1.23
INR: 1.12
PROTHROMBIN TIME: 15.4 s — AB (ref 11.4–15.2)
PROTHROMBIN TIME: 14.3 s (ref 11.4–15.2)

## 2017-02-05 LAB — BLOOD GAS, ARTERIAL
Acid-Base Excess: 1.5 mmol/L (ref 0.0–2.0)
BICARBONATE: 24.7 mmol/L (ref 20.0–28.0)
DRAWN BY: 437071
FIO2: 0.4
MECHVT: 620 mL
O2 Saturation: 98.7 %
PATIENT TEMPERATURE: 99.4
PCO2 ART: 33.8 mmHg (ref 32.0–48.0)
PEEP: 7 cmH2O
PO2 ART: 127 mmHg — AB (ref 83.0–108.0)
RATE: 15 resp/min
pH, Arterial: 7.479 — ABNORMAL HIGH (ref 7.350–7.450)

## 2017-02-05 LAB — GLUCOSE, CAPILLARY
GLUCOSE-CAPILLARY: 203 mg/dL — AB (ref 65–99)
GLUCOSE-CAPILLARY: 171 mg/dL — AB (ref 65–99)
Glucose-Capillary: 147 mg/dL — ABNORMAL HIGH (ref 65–99)
Glucose-Capillary: 147 mg/dL — ABNORMAL HIGH (ref 65–99)
Glucose-Capillary: 150 mg/dL — ABNORMAL HIGH (ref 65–99)
Glucose-Capillary: 157 mg/dL — ABNORMAL HIGH (ref 65–99)
Glucose-Capillary: 166 mg/dL — ABNORMAL HIGH (ref 65–99)

## 2017-02-05 LAB — CBC
HEMATOCRIT: 29 % — AB (ref 39.0–52.0)
HEMOGLOBIN: 9.1 g/dL — AB (ref 13.0–17.0)
MCH: 27.2 pg (ref 26.0–34.0)
MCHC: 31.4 g/dL (ref 30.0–36.0)
MCV: 86.8 fL (ref 78.0–100.0)
Platelets: 278 10*3/uL (ref 150–400)
RBC: 3.34 MIL/uL — ABNORMAL LOW (ref 4.22–5.81)
RDW: 16.9 % — AB (ref 11.5–15.5)
WBC: 16.1 10*3/uL — ABNORMAL HIGH (ref 4.0–10.5)

## 2017-02-05 LAB — URINE CULTURE: Culture: <10000 — AB

## 2017-02-05 LAB — SODIUM
Sodium: 139 mmol/L (ref 135–145)
Sodium: 145 mmol/L (ref 135–145)
Sodium: 146 mmol/L — ABNORMAL HIGH (ref 135–145)

## 2017-02-05 LAB — MAGNESIUM
MAGNESIUM: 2.1 mg/dL (ref 1.7–2.4)
MAGNESIUM: 2 mg/dL (ref 1.7–2.4)
Magnesium: 2.3 mg/dL (ref 1.7–2.4)

## 2017-02-05 LAB — PHOSPHORUS
PHOSPHORUS: 2.4 mg/dL — AB (ref 2.5–4.6)
PHOSPHORUS: 2.2 mg/dL — AB (ref 2.5–4.6)
Phosphorus: 1.6 mg/dL — ABNORMAL LOW (ref 2.5–4.6)

## 2017-02-05 MED ORDER — POTASSIUM CHLORIDE 20 MEQ/15ML (10%) PO SOLN
40.0000 meq | ORAL | Status: AC
  Administered 2017-02-05 (×2): 40 meq
  Filled 2017-02-05 (×2): qty 30

## 2017-02-05 MED ORDER — PANTOPRAZOLE SODIUM 40 MG PO PACK
40.0000 mg | PACK | ORAL | Status: DC
  Administered 2017-02-06 – 2017-03-02 (×24): 40 mg
  Filled 2017-02-05 (×23): qty 20

## 2017-02-05 MED ORDER — ADULT MULTIVITAMIN LIQUID CH
15.0000 mL | Freq: Every day | ORAL | Status: DC
  Administered 2017-02-05 – 2017-02-26 (×21): 15 mL
  Filled 2017-02-05 (×22): qty 15

## 2017-02-05 MED ORDER — POTASSIUM CHLORIDE 20 MEQ/15ML (10%) PO SOLN
40.0000 meq | Freq: Once | ORAL | Status: AC
  Administered 2017-02-05: 40 meq

## 2017-02-05 MED ORDER — VITAL HIGH PROTEIN PO LIQD
1000.0000 mL | ORAL | Status: DC
  Administered 2017-02-05: 20:00:00
  Administered 2017-02-05 – 2017-02-06 (×2): 1000 mL

## 2017-02-05 MED ORDER — ACETAMINOPHEN 650 MG RE SUPP
650.0000 mg | RECTAL | Status: DC | PRN
Start: 2017-02-05 — End: 2017-02-26
  Administered 2017-02-22: 650 mg via RECTAL
  Filled 2017-02-05: qty 1

## 2017-02-05 MED ORDER — FOLIC ACID 1 MG PO TABS
1.0000 mg | ORAL_TABLET | Freq: Every day | ORAL | Status: DC
  Administered 2017-02-05 – 2017-02-26 (×21): 1 mg
  Filled 2017-02-05 (×20): qty 1

## 2017-02-05 MED ORDER — POTASSIUM CHLORIDE 20 MEQ/15ML (10%) PO SOLN
ORAL | Status: AC
  Filled 2017-02-05: qty 30

## 2017-02-05 MED ORDER — NICARDIPINE HCL IN NACL 40-0.83 MG/200ML-% IV SOLN
3.0000 mg/h | INTRAVENOUS | Status: DC
  Administered 2017-02-05: 3 mg/h via INTRAVENOUS
  Administered 2017-02-05: 11 mg/h via INTRAVENOUS
  Administered 2017-02-05 – 2017-02-06 (×3): 13 mg/h via INTRAVENOUS
  Administered 2017-02-06: 5 mg/h via INTRAVENOUS
  Administered 2017-02-06: 13 mg/h via INTRAVENOUS
  Administered 2017-02-06: 15 mg/h via INTRAVENOUS
  Administered 2017-02-06: 11 mg/h via INTRAVENOUS
  Administered 2017-02-07: 10 mg/h via INTRAVENOUS
  Administered 2017-02-07: 12 mg/h via INTRAVENOUS
  Filled 2017-02-05 (×6): qty 200
  Filled 2017-02-05 (×2): qty 400
  Filled 2017-02-05 (×2): qty 200

## 2017-02-05 MED ORDER — ACETAMINOPHEN 160 MG/5ML PO SOLN
650.0000 mg | Freq: Four times a day (QID) | ORAL | Status: DC | PRN
  Administered 2017-02-06 – 2017-02-24 (×15): 650 mg
  Filled 2017-02-05 (×15): qty 20.3

## 2017-02-05 MED ORDER — VITAMIN B-1 100 MG PO TABS
100.0000 mg | ORAL_TABLET | Freq: Every day | ORAL | Status: DC
  Administered 2017-02-05 – 2017-02-26 (×21): 100 mg
  Filled 2017-02-05 (×21): qty 1

## 2017-02-05 MED ORDER — PRO-STAT SUGAR FREE PO LIQD
30.0000 mL | Freq: Two times a day (BID) | ORAL | Status: DC
  Administered 2017-02-05 – 2017-02-06 (×3): 30 mL
  Filled 2017-02-05 (×3): qty 30

## 2017-02-05 NOTE — Progress Notes (Signed)
PHARMACY - PHYSICIAN COMMUNICATION CRITICAL VALUE ALERT - BLOOD CULTURE IDENTIFICATION (BCID)  Results for orders placed or performed during the hospital encounter of 02/04/17  Blood Culture ID Panel (Reflexed) (Collected: 02/04/2017 10:03 AM)  Result Value Ref Range   Enterococcus species NOT DETECTED NOT DETECTED   Listeria monocytogenes NOT DETECTED NOT DETECTED   Staphylococcus species DETECTED (A) NOT DETECTED   Staphylococcus aureus NOT DETECTED NOT DETECTED   Methicillin resistance DETECTED (A) NOT DETECTED   Streptococcus species NOT DETECTED NOT DETECTED   Streptococcus agalactiae NOT DETECTED NOT DETECTED   Streptococcus pneumoniae NOT DETECTED NOT DETECTED   Streptococcus pyogenes NOT DETECTED NOT DETECTED   Acinetobacter baumannii NOT DETECTED NOT DETECTED   Enterobacteriaceae species NOT DETECTED NOT DETECTED   Enterobacter cloacae complex NOT DETECTED NOT DETECTED   Escherichia coli NOT DETECTED NOT DETECTED   Klebsiella oxytoca NOT DETECTED NOT DETECTED   Klebsiella pneumoniae NOT DETECTED NOT DETECTED   Proteus species NOT DETECTED NOT DETECTED   Serratia marcescens NOT DETECTED NOT DETECTED   Haemophilus influenzae NOT DETECTED NOT DETECTED   Neisseria meningitidis NOT DETECTED NOT DETECTED   Pseudomonas aeruginosa NOT DETECTED NOT DETECTED   Candida albicans NOT DETECTED NOT DETECTED   Candida glabrata NOT DETECTED NOT DETECTED   Candida krusei NOT DETECTED NOT DETECTED   Candida parapsilosis NOT DETECTED NOT DETECTED   Candida tropicalis NOT DETECTED NOT DETECTED   Name of physician (or Provider) Contacted: Dr. Arsenio Loader   Changes to prescribed antibiotics required: No changes at this time, patient on broad spectrum Vancomycin and Zosyn. Continue to follow until cx finalized.    York Cerise, PharmD Clinical Pharmacist 02/05/17 6:59 AM

## 2017-02-05 NOTE — Progress Notes (Signed)
Brief Nutrition Note  Consult received for enteral/tube feeding initiation and management.  Adult Enteral Nutrition Protocol initiated. Full assessment to follow.  Admitting Dx: ICH (intracerebral hemorrhage) (HCC) [I61.9]  Body mass index is 29.73 kg/m. Pt meets criteria for overweight based on current BMI.  Labs:   Recent Labs Lab 02/04/17 1849 02/04/17 2344 02/05/17 0514  NA 137 139  139 140  K 2.9* 2.9* 2.8*  CL 103 106 106  CO2 BUN CREATININE 0.68 0.70 0.65  CALCIUM 8.6* 8.6* 8.7*  MG  --   --  2.1  PHOS  --   --  2.4*  GLUCOSE 169* 147* 168*    Tilda Franco, MS, RD, LDN Pager: [phone] After Hours Pager: [phone]

## 2017-02-05 NOTE — Progress Notes (Signed)
[facility] ADULT ICU REPLACEMENT PROTOCOL FOR AM LAB REPLACEMENT ONLY  The patient does apply for the [facility] Adult ICU Electrolyte Replacment Protocol based on the criteria listed below:   1. Is GFR >/= 40 ml/min? Yes.    Patient's GFR today is >60 2. Is urine output >/= 0.5 ml/kg/hr for the last 6 hours? Yes.   Patient's UOP is 1.3 ml/kg/hr 3. Is BUN < 60 mg/dL? Yes.    Patient's BUN today is 12 4. Abnormal electrolyte(s): 2.8 5. Ordered repletion with:per protocol 6. If a panic level lab has been reported, has the CCM MD in charge been notified? Yes.  .   Physician:  Dr. Janne Lab, Dixon Boos 02/05/2017 6:29 AM

## 2017-02-05 NOTE — Progress Notes (Signed)
eLink Physician-Brief Progress Note Patient Name: Octavia Mottola DOB: Oct 15, 1955 MRN: [id number]   Date of Service  02/05/2017  HPI/Events of Note  K+ = 2.9 and Creatinine = 0.68.  eICU Interventions  Will replace K+.     Intervention Category Major Interventions: Electrolyte abnormality - evaluation and management  Chalmers Iddings Eugene 02/05/2017, 8:52 PM

## 2017-02-05 NOTE — Progress Notes (Signed)
PULMONARY / CRITICAL CARE MEDICINE   Name: Stuart Mendoza MRN: [id number] DOB: [birth year]    ADMISSION DATE:  02/04/2017  REFERRING MD:  Dr. Sheria Lang, ER  CHIEF COMPLAINT:  Altered mental status  HISTORY OF PRESENT ILLNESS:   61 yo male smoker brought to ER with altered mental status.  Found to have larged Rt parietal ICH with IVH and 1.7 cm midline shift with hydrocephalus.  Has hx of a fib on xarelto.  Intubated for airway, and given Kcentra.  Had recent admission (9/29 to 10/04) for alcohol withdrawal.  PMHx COPD, DM, HTN.  SUBJECTIVE:  Didn't tolerate SBT >> tachycardic.  VITAL SIGNS: BP (!) 129/91   Pulse (!) 110   Temp 99.4 F (37.4 C) (Axillary)   Resp (!) 22   Ht  (1.778 m)   Wt 207 lb 3.7 oz (94 kg)   SpO2 100%   BMI 29.73 kg/m   VENTILATOR SETTINGS: Vent Mode: PRVC FiO2 (%):  [40 %-50 %] 40 % Set Rate:  [15 bmp-18 bmp] 15 bmp Vt Set:  [620 mL] 620 mL PEEP:  [5 cmH20-7 cmH20] 5 cmH20 Plateau Pressure:  [18 cmH20-21 cmH20] 18 cmH20  INTAKE / OUTPUT: I/O last 3 completed shifts: In: 2045.3 [I.V.:831.1; IV Piggyback:1214.3] Out: 1000 [Urine:1000]  PHYSICAL EXAMINATION:  General - on vent Eyes - pupils reactive ENT - ETT in place Cardiac - irregular, no murmur Chest - decreased BS Rt base, scattered rhonchi Abd - soft, non tender Ext - no edema Skin - no rashes Neuro - RASS -1   LABS:  BMET  Recent Labs Lab 02/04/17 1849 02/04/17 2344 02/05/17 0514  NA 137 139  139 140  K 2.9* 2.9* 2.8*  CL 103 106 106  CO2 BUN CREATININE 0.68 0.70 0.65  GLUCOSE 169* 147* 168*    Electrolytes  Recent Labs Lab 02/04/17 1849 02/04/17 2344 02/05/17 0514  CALCIUM 8.6* 8.6* 8.7*  MG  --   --  2.1  PHOS  --   --  2.4*    CBC  Recent Labs Lab 02/04/17 1001 02/05/17 0514  WBC 16.2* 16.1*  HGB 11.2* 9.1*  HCT 35.0* 29.0*  PLT 321 278    Coag's  Recent Labs Lab 02/04/17 1849 02/05/17 0107 02/05/17 0641  INR  1.14 1.23 1.12    Sepsis Markers  Recent Labs Lab 02/04/17 1021  LATICACIDVEN 1.94*    ABG  Recent Labs Lab 02/04/17 1045 02/04/17 1637 02/05/17 0405  PHART 7.502* 7.484* 7.479*  PCO2ART 36.4 32.6 33.8  PO2ART 236.0* 81.0* 127*    Liver Enzymes  Recent Labs Lab 02/04/17 1001  AST 21  ALT 18  ALKPHOS 99  BILITOT 2.4*  ALBUMIN 3.7    Cardiac Enzymes No results for input(s): TROPONINI, PROBNP in the last 168 hours.  Glucose  Recent Labs Lab 02/02/17 2222 02/04/17 0947 02/04/17 2023 02/05/17 0015 02/05/17 0446 02/05/17 0734  GLUCAP 168* 193* 163* 147* 157* 150*    Imaging Ct Head Wo Contrast  Result Date: 02/04/2017 CLINICAL DATA:  Follow-up intracranial hemorrhage EXAM: CT HEAD WITHOUT CONTRAST TECHNIQUE: Contiguous axial images were obtained from the base of the skull through the vertex without intravenous contrast. COMPARISON:  02/04/2017 @ 1058 hours FINDINGS: Brain: Large stable 7.3 x 5.8 x 7.4 cm intraparenchymal hematoma in the right parietal lobe with intraventricular rupture and surrounding vasogenic edema. Small amount of blood dependently in the occipital horn of the left lateral ventricle. Complete  effacement of the occipital horn of the right lateral ventricle with hemorrhage in the right lateral with mild stable dilatation of the right temporal horn. Severe mass effect with right to left midline shift measuring 17 mm similar in appearance to the earlier examination. Small intraparenchymal hemorrhage in the inferior left frontal lobe. Bilateral frontal lobe encephalomalacia. Generalized cerebral atrophy. Periventricular white matter low attenuation likely secondary to microangiopathy. Vascular: Cerebrovascular atherosclerotic calcifications are noted. Skull: Negative for fracture or focal lesion. Sinuses/Orbits: Visualized portions of the orbits are unremarkable. Visualized portions of the paranasal sinuses and mastoid air cells are unremarkable. Other:  None. IMPRESSION: 1. Stable large 7.3 x 5.8 x 7.4 cm intraparenchymal hemorrhage in the right parietal lobe with intraventricular rupture. Stable persistent 17 mm of right to left midline shift. Persistent mild dilatation of the temporal horn of the right lateral ventricle without significant hydrocephalus. Electronically Signed   By: Elige Ko   On: 02/04/2017 17:43   Dg Chest Port 1 View  Result Date: 02/04/2017 CLINICAL DATA:  Intracranial hemorrhage.  Right lung lavage. EXAM: PORTABLE CHEST 1 VIEW COMPARISON:  One-view chest x-ray from the same day at 12:50 p.m. FINDINGS: The patient remains intubated. The endotracheal tube terminates 5.1 cm above the carina. A left subclavian line is stable. NG tube courses off the inferior border of film. There is significant re-expansion of the right lung. Residual airspace disease is noted at the base. Left lung remains clear. IMPRESSION: 1. Near complete re-expansion of the right lung with some residual airspace disease at the right base. 2. The support apparatus is stable. Electronically Signed   By: Marin Roberts M.D.   On: 02/04/2017 15:07   Dg Chest Port 1 View  Result Date: 02/04/2017 CLINICAL DATA:  Intracerebral hemorrhage. EXAM: PORTABLE CHEST 1 VIEW COMPARISON:  Chest x-ray from same day at 1001. FINDINGS: Endotracheal tube in place with tip approximately 4.1 cm above the level of the carina. Enteric tube entering the stomach with tip below the field of view. Stable cardiomegaly. New complete opacification of the right hemithorax with rightward shift of the mediastinum. The left lung is clear. No pneumothorax. No acute osseous abnormality. IMPRESSION: 1. New complete opacification of the right hemithorax with volume loss, which could reflect large central mucous plugging and collapse, and/or new pleural effusion. 2. Appropriate positioning of the endotracheal tube. Electronically Signed   By: Obie Dredge M.D.   On: 02/04/2017 13:29      STUDIES:  CT head 10/6 >> Large ICH Rt parietal lobe.  1.7 cm of midline shift.  Small amount of hemorrhage in the Lt frontal lobe and a small amount of subdural blood.  CULTURES: Blood 10/6 >> Coag neg staph Urine 10/6 >> negative Sputum 10/6 >>  ANTIBIOTICS: Vancomycin 10/6 >> Zosyn 10/6 >> 10/7  SIGNIFICANT EVENTS: 10/6 Admit, neurology, neurosurgery consulted, kcentra, 3% NS  LINES/TUBES: ETT 10/06 >>   ASSESSMENT / PLAN:  ICH, IVH. - 3% NS per neurology  - neurosurgery consulted >> no interventions at this time  Hypertension. A fib. - goal SBP < 140 per neurology  Acute respiratory failure with compromised airway. Aspiration pneumonia. Hx of COPD. - full vent support - f/u CXR - scheduled BDs  Hx of ETOH. - thiamine, folic acid, MVI  DM type II. - SSI  Hypokalemia. - replace as needed  Anemia of critical illness and chronic disease. - f/u CBC  DVT prophylaxis - SCDs SUP - protonix Nutrition - tube feeds Goals of care -  full code  CC time 31 minutes  Coralyn Helling, MD [facility] Pulmonary/Critical Care 02/05/2017, 11:28 AM Pager:  [phone] After 3pm call: [phone]

## 2017-02-05 NOTE — Consult Note (Signed)
Chief Complaint   Chief Complaint  Patient presents with  . Altered Mental Status    HPI   HPI: Stuart Mendoza is a 61 y.o. male brought to ER yesterday due to altered mental status. Currently intubated. No family at bedside. Unable to obtain history. Of note, he was recently hospitalized due to metabolic encephalopathy prior to his current admission.  Patient Active Problem List   Diagnosis Date Noted  . ICH (intracerebral hemorrhage) ([place]) 02/04/2017  . Unspecified atrial fibrillation ([place]) 02/02/2017  . Type 2 diabetes mellitus without complication ([place]) 16/01/9603  . Hypokalemia 02/02/2017  . COPD (chronic obstructive pulmonary disease) ([place]) 02/02/2017  . Obesity (BMI 30-39.9) 02/02/2017  . Withdrawal syndrome ([place]) 01/28/2017  . Acute metabolic encephalopathy 54/12/8117  . Hyponatremia 01/28/2017  . Accelerated hypertension 01/28/2017    PMH: Past Medical History:  Diagnosis Date  . Atrial fibrillation ([place])   . COPD (chronic obstructive pulmonary disease) ([place])   . Diabetes mellitus without complication ([place])   . Hypertension     PSH: Past Surgical History:  Procedure Laterality Date  . APPENDECTOMY    . TONSILLECTOMY      Prescriptions Prior to Admission  Medication Sig Dispense Refill Last Dose  . albuterol (PROVENTIL HFA;VENTOLIN HFA) 108 (90 Base) MCG/ACT inhaler Inhale 2 puffs into the lungs every 4 (four) hours as needed for wheezing.   prn  . amLODipine (NORVASC) 10 MG tablet Take 10 mg by mouth daily.  3 02/03/2017 at Unknown time  . aspirin EC 81 MG tablet Take 81 mg by mouth daily.   02/03/2017 at unk  . chlorhexidine (PERIDEX) 0.12 % solution 15 mLs by Mouth Rinse route 2 (two) times daily. 120 mL 0 02/03/2017 at Unknown time  . fluticasone (FLONASE) 50 MCG/ACT nasal spray Place 2 sprays into both nostrils daily.  5 02/03/2017 at Unknown time  . folic acid (FOLVITE) 1 MG tablet Take 1 tablet (1 mg total) by mouth daily.   02/03/2017 at Unknown time  .  HYDROcodone-acetaminophen (NORCO) 10-325 MG tablet Take 1 tablet by mouth 4 (four) times daily. 10 tablet 0 unk at unk  . JANUVIA 100 MG tablet Take 100 mg by mouth daily.  3 02/03/2017 at Unknown time  . labetalol (NORMODYNE) 100 MG tablet Take 1 tablet (100 mg total) by mouth 2 (two) times daily.   02/03/2017 at 5pm  . lisinopril (PRINIVIL,ZESTRIL) 20 MG tablet Take 20 mg by mouth daily.  3 02/03/2017 at Unknown time  . Multiple Vitamin (MULTIVITAMIN WITH MINERALS) TABS tablet Take 1 tablet by mouth daily.   02/03/2017 at Unknown time  . nicotine (NICODERM CQ - DOSED IN MG/24 HOURS) 14 mg/24hr patch Place 1 patch (14 mg total) onto the skin daily. 28 patch 0 02/03/2017 at Unknown time  . omeprazole (PRILOSEC) 20 MG capsule Take 20 mg by mouth daily.   3 02/04/2017 at Unknown time  . SPIRIVA HANDIHALER 18 MCG inhalation capsule Place 18 mcg into inhaler and inhale daily.  3 02/03/2017 at Unknown time  . XARELTO 20 MG TABS tablet Take 20 mg by mouth daily.  3 unk at unk  . polyvinyl alcohol (LIQUIFILM TEARS) 1.4 % ophthalmic solution Place 1 drop into both eyes as needed for dry eyes. (Patient not taking: Reported on 02/04/2017) 15 mL 0 Not Taking at Unknown time    SH: Social History  Substance Use Topics  . Smoking status: Current Every Day Smoker  . Smokeless tobacco: Never Used  . Alcohol use  Yes    MEDS: Prior to Admission medications   Medication Sig Start Date End Date Taking? Authorizing Provider  albuterol (PROVENTIL HFA;VENTOLIN HFA) 108 (90 Base) MCG/ACT inhaler Inhale 2 puffs into the lungs every 4 (four) hours as needed for wheezing. 03/22/13  Yes [provider]  amLODipine (NORVASC) 10 MG tablet Take 10 mg by mouth daily. 12/22/16  Yes [provider]  aspirin EC 81 MG tablet Take 81 mg by mouth daily.   Yes [provider]  chlorhexidine (PERIDEX) 0.12 % solution 15 mLs by Mouth Rinse route 2 (two) times daily. 02/02/17  Yes Rama, Venetia Maxon, MD   fluticasone (FLONASE) 50 MCG/ACT nasal spray Place 2 sprays into both nostrils daily. 01/17/17  Yes [provider]  folic acid (FOLVITE) 1 MG tablet Take 1 tablet (1 mg total) by mouth daily. 02/03/17  Yes Rama, Venetia Maxon, MD  HYDROcodone-acetaminophen (NORCO) 10-325 MG tablet Take 1 tablet by mouth 4 (four) times daily. 02/02/17  Yes Rama, Venetia Maxon, MD  JANUVIA 100 MG tablet Take 100 mg by mouth daily. 12/23/16  Yes [provider]  labetalol (NORMODYNE) 100 MG tablet Take 1 tablet (100 mg total) by mouth 2 (two) times daily. 02/02/17  Yes Rama, Venetia Maxon, MD  lisinopril (PRINIVIL,ZESTRIL) 20 MG tablet Take 20 mg by mouth daily. 12/22/16  Yes [provider]  Multiple Vitamin (MULTIVITAMIN WITH MINERALS) TABS tablet Take 1 tablet by mouth daily. 02/03/17  Yes Rama, Venetia Maxon, MD  nicotine (NICODERM CQ - DOSED IN MG/24 HOURS) 14 mg/24hr patch Place 1 patch (14 mg total) onto the skin daily. 02/03/17  Yes Rama, Venetia Maxon, MD  omeprazole (PRILOSEC) 20 MG capsule Take 20 mg by mouth daily.  12/22/16  Yes [provider]  SPIRIVA HANDIHALER 18 MCG inhalation capsule Place 18 mcg into inhaler and inhale daily. 12/23/16  Yes [provider]  XARELTO 20 MG TABS tablet Take 20 mg by mouth daily. 12/26/16  Yes [provider]  polyvinyl alcohol (LIQUIFILM TEARS) 1.4 % ophthalmic solution Place 1 drop into both eyes as needed for dry eyes. Patient not taking: Reported on 02/04/2017 02/02/17   Rama, Venetia Maxon, MD    ALLERGY: Allergies  Allergen Reactions  . Metformin And Related     Social History  Substance Use Topics  . Smoking status: Current Every Day Smoker  . Smokeless tobacco: Never Used  . Alcohol use Yes     No family history on file.   ROS   ROS unable to obtain; intubated  Exam   Vitals:   02/05/17 0802 02/05/17 0900  BP:  (!) 154/75  Pulse:  (!) 117  Resp:  (!) 0  Temp:    SpO2: 100% 100%   Intubated PERRL. Opens  eyes on command Moves RUE and RLE on command Does not move LUE or LLE on command. Will flex lower extremity to painful stimulus. Extendes LUE to painful stimulus.  Results - Imaging/Labs   Results for orders placed or performed during the hospital encounter of 02/04/17 (from the past 48 hour(s))  CBG monitoring, ED     Status: Abnormal   Collection Time: 02/04/17  9:47 AM  Result Value Ref Range   Glucose-Capillary 193 (H) 65 - 99 mg/dL  Comprehensive metabolic panel     Status: Abnormal   Collection Time: 02/04/17 10:01 AM  Result Value Ref Range   Sodium 133 (L) 135 - 145 mmol/L   Potassium 3.4 (L) 3.5 - 5.1  mmol/L   Chloride 94 (L) 101 - 111 mmol/L   CO2 25 22 - 32 mmol/L   Glucose, Bld 196 (H) 65 - 99 mg/dL   BUN 12 6 - 20 mg/dL   Creatinine, Ser 0.82 0.61 - 1.24 mg/dL   Calcium 9.8 8.9 - 10.3 mg/dL   Total Protein 7.5 6.5 - 8.1 g/dL   Albumin 3.7 3.5 - 5.0 g/dL   AST 21 15 - 41 U/L   ALT 18 17 - 63 U/L   Alkaline Phosphatase 99 38 - 126 U/L   Total Bilirubin 2.4 (H) 0.3 - 1.2 mg/dL   GFR calc non Af Amer >60 >60 mL/min   GFR calc Af Amer >60 >60 mL/min    Comment: (NOTE) The eGFR has been calculated using the CKD EPI equation. This calculation has not been validated in all clinical situations. eGFR's persistently <60 mL/min signify possible Chronic Kidney Disease.    Anion gap 14 5 - 15  CBC WITH DIFFERENTIAL     Status: Abnormal   Collection Time: 02/04/17 10:01 AM  Result Value Ref Range   WBC 16.2 (H) 4.0 - 10.5 K/uL   RBC 4.09 (L) 4.22 - 5.81 MIL/uL   Hemoglobin 11.2 (L) 13.0 - 17.0 g/dL   HCT 35.0 (L) 39.0 - 52.0 %   MCV 85.6 78.0 - 100.0 fL   MCH 27.4 26.0 - 34.0 pg   MCHC 32.0 30.0 - 36.0 g/dL   RDW 16.2 (H) 11.5 - 15.5 %   Platelets 321 150 - 400 K/uL   Neutrophils Relative % 88 %   Lymphocytes Relative 7 %   Monocytes Relative 5 %   Eosinophils Relative 0 %   Basophils Relative 0 %   Neutro Abs 14.3 (H) 1.7 - 7.7 K/uL   Lymphs Abs 1.1 0.7 - 4.0  K/uL   Monocytes Absolute 0.8 0.1 - 1.0 K/uL   Eosinophils Absolute 0.0 0.0 - 0.7 K/uL   Basophils Absolute 0.0 0.0 - 0.1 K/uL   RBC Morphology POLYCHROMASIA PRESENT   Ammonia     Status: None   Collection Time: 02/04/17 10:01 AM  Result Value Ref Range   Ammonia 35 9 - 35 umol/L  Triglycerides     Status: None   Collection Time: 02/04/17 10:01 AM  Result Value Ref Range   Triglycerides 55 <150 mg/dL  Ethanol     Status: None   Collection Time: 02/04/17 10:01 AM  Result Value Ref Range   Alcohol, Ethyl (B) <10 <10 mg/dL    Comment:        LOWEST DETECTABLE LIMIT FOR SERUM ALCOHOL IS 10 mg/dL FOR MEDICAL PURPOSES ONLY Please note change in reference range.   Acetaminophen level     Status: Abnormal   Collection Time: 02/04/17 10:01 AM  Result Value Ref Range   Acetaminophen (Tylenol), Serum <10 (L) 10 - 30 ug/mL    Comment:        THERAPEUTIC CONCENTRATIONS VARY SIGNIFICANTLY. A RANGE OF 10-30 ug/mL MAY BE AN EFFECTIVE CONCENTRATION FOR MANY PATIENTS. HOWEVER, SOME ARE BEST TREATED AT CONCENTRATIONS OUTSIDE THIS RANGE. ACETAMINOPHEN CONCENTRATIONS >150 ug/mL AT 4 HOURS AFTER INGESTION AND >50 ug/mL AT 12 HOURS AFTER INGESTION ARE OFTEN ASSOCIATED WITH TOXIC REACTIONS.   Salicylate level     Status: None   Collection Time: 02/04/17 10:01 AM  Result Value Ref Range   Salicylate Lvl <9.7 2.8 - 30.0 mg/dL  Blood Culture (routine x 2)     Status: None (  Preliminary result)   Collection Time: 02/04/17 10:03 AM  Result Value Ref Range   Specimen Description BLOOD LEFT ANTECUBITAL    Special Requests      BOTTLES DRAWN AEROBIC AND ANAEROBIC Blood Culture adequate volume   Culture  Setup Time      GRAM POSITIVE COCCI IN CLUSTERS IN BOTH AEROBIC AND ANAEROBIC BOTTLES Organism ID to follow CRITICAL RESULT CALLED TO, READ BACK BY AND VERIFIED WITH: K.COOKS,PHARMD AT 2426 ON 02/05/17 BY G.[email or web address]    Culture PENDING    Report Status PENDING   Blood Culture ID Panel  (Reflexed)     Status: Abnormal   Collection Time: 02/04/17 10:03 AM  Result Value Ref Range   Enterococcus species NOT DETECTED NOT DETECTED   Listeria monocytogenes NOT DETECTED NOT DETECTED   Staphylococcus species DETECTED (A) NOT DETECTED    Comment: Methicillin (oxacillin) resistant coagulase negative staphylococcus. Possible blood culture contaminant (unless isolated from more than one blood culture draw or clinical case suggests pathogenicity). No antibiotic treatment is indicated for blood  culture contaminants. CRITICAL RESULT CALLED TO, READ BACK BY AND VERIFIED WITH: K.COOKS,PHARMD AT 8341 ON 02/05/17 BY [email or web address]    Staphylococcus aureus NOT DETECTED NOT DETECTED   Methicillin resistance DETECTED (A) NOT DETECTED    Comment: CRITICAL RESULT CALLED TO, READ BACK BY AND VERIFIED WITH: K.COOKS,PHARMD AT 9622 ON 02/05/17 BY G.MCADOO    Streptococcus species NOT DETECTED NOT DETECTED   Streptococcus agalactiae NOT DETECTED NOT DETECTED   Streptococcus pneumoniae NOT DETECTED NOT DETECTED   Streptococcus pyogenes NOT DETECTED NOT DETECTED   Acinetobacter baumannii NOT DETECTED NOT DETECTED   Enterobacteriaceae species NOT DETECTED NOT DETECTED   Enterobacter cloacae complex NOT DETECTED NOT DETECTED   Escherichia coli NOT DETECTED NOT DETECTED   Klebsiella oxytoca NOT DETECTED NOT DETECTED   Klebsiella pneumoniae NOT DETECTED NOT DETECTED   Proteus species NOT DETECTED NOT DETECTED   Serratia marcescens NOT DETECTED NOT DETECTED   Haemophilus influenzae NOT DETECTED NOT DETECTED   Neisseria meningitidis NOT DETECTED NOT DETECTED   Pseudomonas aeruginosa NOT DETECTED NOT DETECTED   Candida albicans NOT DETECTED NOT DETECTED   Candida glabrata NOT DETECTED NOT DETECTED   Candida krusei NOT DETECTED NOT DETECTED   Candida parapsilosis NOT DETECTED NOT DETECTED   Candida tropicalis NOT DETECTED NOT DETECTED  Urinalysis, Routine w reflex microscopic     Status: Abnormal    Collection Time: 02/04/17 10:04 AM  Result Value Ref Range   Color, Urine YELLOW YELLOW   APPearance CLEAR CLEAR   Specific Gravity, Urine 1.013 1.005 - 1.030   pH 7.0 5.0 - 8.0   Glucose, UA 150 (A) NEGATIVE mg/dL   Hgb urine dipstick NEGATIVE NEGATIVE   Bilirubin Urine NEGATIVE NEGATIVE   Ketones, ur 20 (A) NEGATIVE mg/dL   Protein, ur 30 (A) NEGATIVE mg/dL   Nitrite NEGATIVE NEGATIVE   Leukocytes, UA NEGATIVE NEGATIVE   RBC / HPF 0-5 0 - 5 RBC/hpf   WBC, UA 0-5 0 - 5 WBC/hpf   Bacteria, UA NONE SEEN NONE SEEN   Squamous Epithelial / LPF 0-5 (A) NONE SEEN  I-Stat venous blood gas, ED ([facility], [facility])     Status: Abnormal   Collection Time: 02/04/17 10:15 AM  Result Value Ref Range   pH, Ven 7.530 (H) 7.250 - 7.430   pCO2, Ven 34.0 (L) 44.0 - 60.0 mmHg   pO2, Ven 46.0 (H) 32.0 - 45.0 mmHg   Bicarbonate 28.4 (H) 20.0 - 28.0 mmol/L  TCO2 29 22 - 32 mmol/L   O2 Saturation 87.0 %   Acid-Base Excess 6.0 (H) 0.0 - 2.0 mmol/L   Patient temperature HIDE    Sample type VENOUS   I-Stat CG4 Lactic Acid, ED  (not at  [facility])     Status: Abnormal   Collection Time: 02/04/17 10:21 AM  Result Value Ref Range   Lactic Acid, Venous 1.94 (H) 0.5 - 1.9 mmol/L  I-stat troponin, ED (not at [facility])     Status: Abnormal   Collection Time: 02/04/17 10:25 AM  Result Value Ref Range   Troponin i, poc 0.09 (HH) 0.00 - 0.08 ng/mL   Comment NOTIFIED PHYSICIAN    Comment 3            Comment: Due to the release kinetics of cTnI, a negative result within the first hours of the onset of symptoms does not rule out myocardial infarction with certainty. If myocardial infarction is still suspected, repeat the test at appropriate intervals.   I-Stat arterial blood gas, ED     Status: Abnormal   Collection Time: 02/04/17 10:45 AM  Result Value Ref Range   pH, Arterial 7.502 (H) 7.350 - 7.450   pCO2 arterial 36.4 32.0 - 48.0 mmHg   pO2, Arterial 236.0 (H) 83.0 - 108.0 mmHg   Bicarbonate 28.6 (H) 20.0 - 28.0  mmol/L   TCO2 30 22 - 32 mmol/L   O2 Saturation 100.0 %   Acid-Base Excess 5.0 (H) 0.0 - 2.0 mmol/L   Patient temperature HIDE    Sample type ARTERIAL   Hemoglobin A1c     Status: Abnormal   Collection Time: 02/04/17  2:32 PM  Result Value Ref Range   Hgb A1c MFr Bld 5.7 (H) 4.8 - 5.6 %    Comment: (NOTE) Pre diabetes:          5.7%-6.4% Diabetes:              >6.4% Glycemic control for   <7.0% adults with diabetes    Mean Plasma Glucose 116.89 mg/dL  MRSA PCR Screening     Status: None   Collection Time: 02/04/17  3:06 PM  Result Value Ref Range   MRSA by PCR NEGATIVE NEGATIVE    Comment:        The GeneXpert MRSA Assay (FDA approved for NASAL specimens only), is one component of a comprehensive MRSA colonization surveillance program. It is not intended to diagnose MRSA infection nor to guide or monitor treatment for MRSA infections.   Basic metabolic panel     Status: Abnormal   Collection Time: 02/04/17  3:46 PM  Result Value Ref Range   Sodium 136 135 - 145 mmol/L   Potassium 3.2 (L) 3.5 - 5.1 mmol/L   Chloride 104 101 - 111 mmol/L   CO2 25 22 - 32 mmol/L   Glucose, Bld 174 (H) 65 - 99 mg/dL   BUN 12 6 - 20 mg/dL   Creatinine, Ser 0.72 0.61 - 1.24 mg/dL   Calcium 8.4 (L) 8.9 - 10.3 mg/dL   GFR calc non Af Amer >60 >60 mL/min   GFR calc Af Amer >60 >60 mL/min    Comment: (NOTE) The eGFR has been calculated using the CKD EPI equation. This calculation has not been validated in all clinical situations. eGFR's persistently <60 mL/min signify possible Chronic Kidney Disease.    Anion gap 7 5 - 15  Protime-INR     Status: None   Collection Time: 02/04/17  3:46 PM  Result Value Ref Range   Prothrombin Time 14.3 11.4 - 15.2 seconds   INR 1.12   I-STAT 3, arterial blood gas (G3+)     Status: Abnormal   Collection Time: 02/04/17  4:37 PM  Result Value Ref Range   pH, Arterial 7.484 (H) 7.350 - 7.450   pCO2 arterial 32.6 32.0 - 48.0 mmHg   pO2, Arterial 81.0 (L)  83.0 - 108.0 mmHg   Bicarbonate 24.4 20.0 - 28.0 mmol/L   TCO2 25 22 - 32 mmol/L   O2 Saturation 97.0 %   Acid-Base Excess 1.0 0.0 - 2.0 mmol/L   Patient temperature HIDE    Collection site RADIAL, ALLEN'S TEST ACCEPTABLE    Drawn by RT    Sample type ARTERIAL   Basic metabolic panel     Status: Abnormal   Collection Time: 02/04/17  6:49 PM  Result Value Ref Range   Sodium 137 135 - 145 mmol/L   Potassium 2.9 (L) 3.5 - 5.1 mmol/L   Chloride 103 101 - 111 mmol/L   CO2 26 22 - 32 mmol/L   Glucose, Bld 169 (H) 65 - 99 mg/dL   BUN 13 6 - 20 mg/dL   Creatinine, Ser 0.68 0.61 - 1.24 mg/dL   Calcium 8.6 (L) 8.9 - 10.3 mg/dL   GFR calc non Af Amer >60 >60 mL/min   GFR calc Af Amer >60 >60 mL/min    Comment: (NOTE) The eGFR has been calculated using the CKD EPI equation. This calculation has not been validated in all clinical situations. eGFR's persistently <60 mL/min signify possible Chronic Kidney Disease.    Anion gap 8 5 - 15  Protime-INR     Status: None   Collection Time: 02/04/17  6:49 PM  Result Value Ref Range   Prothrombin Time 14.5 11.4 - 15.2 seconds   INR 1.14   Glucose, capillary     Status: Abnormal   Collection Time: 02/04/17  8:23 PM  Result Value Ref Range   Glucose-Capillary 163 (H) 65 - 99 mg/dL  Basic metabolic panel     Status: Abnormal   Collection Time: 02/04/17 11:44 PM  Result Value Ref Range   Sodium 139 135 - 145 mmol/L   Potassium 2.9 (L) 3.5 - 5.1 mmol/L   Chloride 106 101 - 111 mmol/L   CO2 25 22 - 32 mmol/L   Glucose, Bld 147 (H) 65 - 99 mg/dL   BUN 13 6 - 20 mg/dL   Creatinine, Ser 0.70 0.61 - 1.24 mg/dL   Calcium 8.6 (L) 8.9 - 10.3 mg/dL   GFR calc non Af Amer >60 >60 mL/min   GFR calc Af Amer >60 >60 mL/min    Comment: (NOTE) The eGFR has been calculated using the CKD EPI equation. This calculation has not been validated in all clinical situations. eGFR's persistently <60 mL/min signify possible Chronic Kidney Disease.    Anion gap 8  5 - 15  Sodium     Status: None   Collection Time: 02/04/17 11:44 PM  Result Value Ref Range   Sodium 139 135 - 145 mmol/L  Glucose, capillary     Status: Abnormal   Collection Time: 02/05/17 12:15 AM  Result Value Ref Range   Glucose-Capillary 147 (H) 65 - 99 mg/dL  Protime-INR     Status: Abnormal   Collection Time: 02/05/17  1:07 AM  Result Value Ref Range   Prothrombin Time 15.4 (H) 11.4 - 15.2 seconds   INR  1.23   Blood gas, arterial     Status: Abnormal   Collection Time: 02/05/17  4:05 AM  Result Value Ref Range   FIO2 0.40    Delivery systems VENTILATOR    Mode PRESSURE REGULATED VOLUME CONTROL    VT 620 mL   LHR 15 resp/min   Peep/cpap 7.0 cm H20   pH, Arterial 7.479 (H) 7.350 - 7.450   pCO2 arterial 33.8 32.0 - 48.0 mmHg   pO2, Arterial 127 (H) 83.0 - 108.0 mmHg   Bicarbonate 24.7 20.0 - 28.0 mmol/L   Acid-Base Excess 1.5 0.0 - 2.0 mmol/L   O2 Saturation 98.7 %   Patient temperature 99.4    Collection site LEFT RADIAL    Drawn by [id number]    Sample type ARTERIAL DRAW    Allens test (pass/fail) PASS PASS  Glucose, capillary     Status: Abnormal   Collection Time: 02/05/17  4:46 AM  Result Value Ref Range   Glucose-Capillary 157 (H) 65 - 99 mg/dL  CBC     Status: Abnormal   Collection Time: 02/05/17  5:14 AM  Result Value Ref Range   WBC 16.1 (H) 4.0 - 10.5 K/uL   RBC 3.34 (L) 4.22 - 5.81 MIL/uL   Hemoglobin 9.1 (L) 13.0 - 17.0 g/dL   HCT 29.0 (L) 39.0 - 52.0 %   MCV 86.8 78.0 - 100.0 fL   MCH 27.2 26.0 - 34.0 pg   MCHC 31.4 30.0 - 36.0 g/dL   RDW 16.9 (H) 11.5 - 15.5 %   Platelets 278 150 - 400 K/uL  Basic metabolic panel     Status: Abnormal   Collection Time: 02/05/17  5:14 AM  Result Value Ref Range   Sodium 140 135 - 145 mmol/L   Potassium 2.8 (L) 3.5 - 5.1 mmol/L   Chloride 106 101 - 111 mmol/L   CO2 24 22 - 32 mmol/L   Glucose, Bld 168 (H) 65 - 99 mg/dL   BUN 12 6 - 20 mg/dL   Creatinine, Ser 0.65 0.61 - 1.24 mg/dL   Calcium 8.7 (L) 8.9 - 10.3  mg/dL   GFR calc non Af Amer >60 >60 mL/min   GFR calc Af Amer >60 >60 mL/min    Comment: (NOTE) The eGFR has been calculated using the CKD EPI equation. This calculation has not been validated in all clinical situations. eGFR's persistently <60 mL/min signify possible Chronic Kidney Disease.    Anion gap 10 5 - 15  Magnesium     Status: None   Collection Time: 02/05/17  5:14 AM  Result Value Ref Range   Magnesium 2.1 1.7 - 2.4 mg/dL  Phosphorus     Status: Abnormal   Collection Time: 02/05/17  5:14 AM  Result Value Ref Range   Phosphorus 2.4 (L) 2.5 - 4.6 mg/dL  Protime-INR     Status: None   Collection Time: 02/05/17  6:41 AM  Result Value Ref Range   Prothrombin Time 14.3 11.4 - 15.2 seconds   INR 1.12   Glucose, capillary     Status: Abnormal   Collection Time: 02/05/17  7:34 AM  Result Value Ref Range   Glucose-Capillary 150 (H) 65 - 99 mg/dL   Comment 1 Notify RN    Comment 2 Document in Chart     Ct Head Wo Contrast  Result Date: 02/04/2017 CLINICAL DATA:  Follow-up intracranial hemorrhage EXAM: CT HEAD WITHOUT CONTRAST TECHNIQUE: Contiguous axial images were obtained from the base of  the skull through the vertex without intravenous contrast. COMPARISON:  02/04/2017 @ 1058 hours FINDINGS: Brain: Large stable 7.3 x 5.8 x 7.4 cm intraparenchymal hematoma in the right parietal lobe with intraventricular rupture and surrounding vasogenic edema. Small amount of blood dependently in the occipital horn of the left lateral ventricle. Complete effacement of the occipital horn of the right lateral ventricle with hemorrhage in the right lateral with mild stable dilatation of the right temporal horn. Severe mass effect with right to left midline shift measuring 17 mm similar in appearance to the earlier examination. Small intraparenchymal hemorrhage in the inferior left frontal lobe. Bilateral frontal lobe encephalomalacia. Generalized cerebral atrophy. Periventricular white matter low  attenuation likely secondary to microangiopathy. Vascular: Cerebrovascular atherosclerotic calcifications are noted. Skull: Negative for fracture or focal lesion. Sinuses/Orbits: Visualized portions of the orbits are unremarkable. Visualized portions of the paranasal sinuses and mastoid air cells are unremarkable. Other: None. IMPRESSION: 1. Stable large 7.3 x 5.8 x 7.4 cm intraparenchymal hemorrhage in the right parietal lobe with intraventricular rupture. Stable persistent 17 mm of right to left midline shift. Persistent mild dilatation of the temporal horn of the right lateral ventricle without significant hydrocephalus. Electronically Signed   By: Kathreen Devoid   On: 02/04/2017 17:43   Ct Head Wo Contrast  Result Date: 02/04/2017 CLINICAL DATA:  Unresponsive.  Multiple falls. EXAM: CT HEAD WITHOUT CONTRAST TECHNIQUE: Contiguous axial images were obtained from the base of the skull through the vertex without intravenous contrast. COMPARISON:  01/28/2017 FINDINGS: Brain: There is a large intraparenchymal hemorrhage involving the right parietal lobe and right occipital lobe. This hemorrhage is causing 1.7 cm of right to left midline shift. There is blood in the right lateral ventricle particularly at the right temporal horn. Small amount of blood in the posterior left lateral ventricle. No significant dilatation of the ventricles. There is some compression of the third ventricle. This large intraparenchymal hemorrhage measures roughly 7.4 x 6.2 x 7.4 cm. Low-density edema surrounding this large hemorrhage. There is also a small amount of intracranial blood at the base of the left frontal lobe. Patient has chronic encephalomalacia in the bilateral frontal lobes. There may be a small amount of subdural blood along the falx. Vascular: No hyperdense vessel or unexpected calcification. Skull: No acute bone abnormality.  No calvarial fracture. Sinuses/Orbits: Fluid in nasopharynx and nasal cavity. No significant  disease in the visualized paranasal sinuses. Other: Patient is intubated. IMPRESSION: Large intracranial hemorrhage centered in the right parietal lobe. 1.7 cm of midline shift with intraventricular blood. Small amount of hemorrhage in the left frontal lobe and a small amount of subdural blood. Critical Value/emergent results were called by telephone at the time of interpretation on 02/04/2017 at 11:10 am to Dr. Duffy Bruce , who verbally acknowledged these results. Electronically Signed   By: Markus Daft M.D.   On: 02/04/2017 11:19   Dg Chest Port 1 View  Result Date: 02/04/2017 CLINICAL DATA:  Intracranial hemorrhage.  Right lung lavage. EXAM: PORTABLE CHEST 1 VIEW COMPARISON:  One-view chest x-ray from the same day at 12:50 p.m. FINDINGS: The patient remains intubated. The endotracheal tube terminates 5.1 cm above the carina. A left subclavian line is stable. NG tube courses off the inferior border of film. There is significant re-expansion of the right lung. Residual airspace disease is noted at the base. Left lung remains clear. IMPRESSION: 1. Near complete re-expansion of the right lung with some residual airspace disease at the right base. 2. The support  apparatus is stable. Electronically Signed   By: San Morelle M.D.   On: 02/04/2017 15:07   Dg Chest Port 1 View  Result Date: 02/04/2017 CLINICAL DATA:  Intracerebral hemorrhage. EXAM: PORTABLE CHEST 1 VIEW COMPARISON:  Chest x-ray from same day at 1001. FINDINGS: Endotracheal tube in place with tip approximately 4.1 cm above the level of the carina. Enteric tube entering the stomach with tip below the field of view. Stable cardiomegaly. New complete opacification of the right hemithorax with rightward shift of the mediastinum. The left lung is clear. No pneumothorax. No acute osseous abnormality. IMPRESSION: 1. New complete opacification of the right hemithorax with volume loss, which could reflect large central mucous plugging and  collapse, and/or new pleural effusion. 2. Appropriate positioning of the endotracheal tube. Electronically Signed   By: Titus Dubin M.D.   On: 02/04/2017 13:29   Dg Chest Portable 1 View  Result Date: 02/04/2017 CLINICAL DATA:  Intubation. EXAM: PORTABLE CHEST 1 VIEW COMPARISON:  None. FINDINGS: Heart size is mildly enlarged. Endotracheal tube tip is 4 cm above the carina. Nasogastric tube enters the stomach. The left lung is clear. There is right upper lobe infiltrate and collapse. Right lower lung is clear. No effusions. No acute bone finding. Previous rib reconstructions on the left. IMPRESSION: Right upper lobe infiltrate and collapse. Endotracheal tube tip 4 cm above the carina. Nasogastric tube in the stomach. Electronically Signed   By: Nelson Chimes M.D.   On: 02/04/2017 10:14    Impression/Plan   61 y.o. male with sizable right intraparenchymal hemorrhage. Neuro exam deficits as above. Reviewed CT head with Dr Cyndy Freeze. Repeat scan stable. Midline shift appears closer to 1cm than 1.7cm. No significant hydrocephalus. No acute NS intervention. Continue with management per PCCM/Neuro.

## 2017-02-05 NOTE — Progress Notes (Signed)
STROKE TEAM PROGRESS NOTE   HISTORY OF PRESENT ILLNESS (per record)  Stuart Mendoza is a 61 y.o. male with a past medical history of atrial fibrillation on XARELTO, hypertension, alcohol abuse and recent history of frequent falls, discharged on 02/02/2017 after having a long hospital course for acute metabolic encephalopathy secondary to alcohol withdrawal, was in skilled nursing facility for rehabilitation where he had increasing confusion over the last 2 days, and had increased work of breathing and brought back to the ER for evaluation of those. A noncontrast CT scan of the head was done that showed a large right parietal intraparenchymal hemorrhage, with IVH, with 1.7 cm midline shift and hydrocephalus. He was a GCS of 4 on arrival. At the time of my examination, he had this been intubated little while ago and had sedatives and paralytics on board.   LKW: 48 hours ago tpa given?: no, ICH Premorbid modified Rankin scale (mRS): 3  ICH Score:  4   SUBJECTIVE (INTERVAL HISTORY) No family members present. The patient is able to follow some simple commands. He remains intubated.His blood pressure has been well controlled. No neurological worsening and follow-up scan yesterday showed no significant increase in hemorrhage   OBJECTIVE Temp:  [98.5 F (36.9 C)-99.9 F (37.7 C)] 99.3 F (37.4 C) (10/07 1140) Pulse Rate:  [42-119] 119 (10/07 1215) Cardiac Rhythm: Atrial fibrillation (10/07 0800) Resp:  [0-28] 16 (10/07 1215) BP: (94-188)/(54-109) 182/109 (10/07 1215) SpO2:  [91 %-100 %] 100 % (10/07 1215) FiO2 (%):  [40 %-50 %] 40 % (10/07 1119) Weight:  [204 lb 2.3 oz (92.6 kg)-207 lb 3.7 oz (94 kg)] 207 lb 3.7 oz (94 kg) (10/07 0600)  CBC:   Recent Labs Lab 02/04/17 1001 02/05/17 0514  WBC 16.2* 16.1*  NEUTROABS 14.3*  --   HGB 11.2* 9.1*  HCT 35.0* 29.0*  MCV 85.6 86.8  PLT 321 278    Basic Metabolic Panel:   Recent Labs Lab 02/04/17 2344 02/05/17 0514  NA 139   139 140  K 2.9* 2.8*  CL 106 106  CO2 25 24  GLUCOSE 147* 168*  BUN 13 12  CREATININE 0.70 0.65  CALCIUM 8.6* 8.7*  MG  --  2.1  PHOS  --  2.4*    Lipid Panel:     Component Value Date/Time   TRIG 55 02/04/2017 1001   HgbA1c:  Lab Results  Component Value Date   HGBA1C 5.7 (H) 02/04/2017   Urine Drug Screen:     Component Value Date/Time   LABOPIA POSITIVE (A) 01/29/2017 0259   COCAINSCRNUR NONE DETECTED 01/29/2017 0259   LABBENZ POSITIVE (A) 01/29/2017 0259   AMPHETMU NONE DETECTED 01/29/2017 0259   THCU NONE DETECTED 01/29/2017 0259   LABBARB NONE DETECTED 01/29/2017 0259    Alcohol Level     Component Value Date/Time   ETH <10 02/04/2017 1001    IMAGING   Ct Head Wo Contrast 02/04/2017 IMPRESSION:  Stable large 7.3 x 5.8 x 7.4 cm intraparenchymal hemorrhage in the right parietal lobe with intraventricular rupture.  Stable persistent 17 mm of right to left midline shift.  Persistent mild dilatation of the temporal horn of the right lateral ventricle without significant hydrocephalus.    Ct Head Wo Contrast 02/04/2017 IMPRESSION:  Large intracranial hemorrhage centered in the right parietal lobe.  1.7 cm of midline shift with intraventricular blood.  Small amount of hemorrhage in the left frontal lobe and a small amount of subdural blood.     Dg  Chest Port 1 View  02/04/2017 IMPRESSION:  1. Near complete re-expansion of the right lung with some residual airspace disease at the right base.  2. The support apparatus is stable.    Dg Chest Port 1 View 02/04/2017 IMPRESSION:  1. New complete opacification of the right hemithorax with volume loss, which could reflect large central mucous plugging and collapse, and/or new pleural effusion.  2. Appropriate positioning of the endotracheal tube.    Dg Chest Portable 1 View 02/04/2017 IMPRESSION:  Right upper lobe infiltrate and collapse.  Endotracheal tube tip 4 cm above the carina.  Nasogastric tube in  the stomach.     PHYSICAL EXAM Vitals:   02/05/17 1119 02/05/17 1140 02/05/17 1200 02/05/17 1215  BP: (!) 129/91  (!) 188/105 (!) 182/109  Pulse: (!) 110  65 (!) 119  Resp: (!) Temp:  99.3 F (37.4 C)    TempSrc:  Axillary    SpO2: 100%  100% 100%  Weight:      Height:       Middle aged male  Not in distress. . Afebrile. Head is nontraumatic. Neck is supple without bruit.    Cardiac exam no murmur or gallop. Lungs are clear to auscultation. Distal pulses are well felt. Neurological Exam : Sedated and intubated. Opens eyes to sternal rub. Follows only occasional midline commands and moves the right side purposefully. Pupils 4 mm sluggishly reactive. Corneal reflexes are present. Cough and gag are present. Fundi were not visualized. Left lower facial weakness. Blinks to threat on the right more than left. Dense left hemiplegia but will withdraw left lower extremity to painful stimuli. Tone is diminished on the left compared to the right. Right plantar downgoing left upgoing.  ASSESSMENT/PLAN [name] is a 61 y.o. male with history of a previous stroke, hypertension, alcohol abuse, diabetes, COPD, and atrial fibrillation on Xarelto with recent falls presenting with increased confusion with increased work of breathing. He did not receive IV t-PA due to ICH.  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 17 mm of right to left midline shift.   MRI head - not performed  MRA head - not performed  Carotid Doppler - not performed  2D Echo - not performed  LDL - not performed  HgbA1c - not performed  VTE prophylaxis - SCDs Diet NPO time specified  aspirin 81 mg daily and Xarelto (rivaroxaban) daily prior to admission, now on No antithrombotic  Ongoing aggressive stroke risk factor management  Therapy  recommendations: pending  Disposition: Pending  Hypertension  Unstable - blood pressure still somewhat high - labetalol and Cardene prn  Long-term BP goal normotensive   Hyperlipidemia  Home meds: No lipid lowering medications prior to admission  Diabetes mellitus  Hemoglobin A1c - 5.7  Controlled  Sliding scale insulin - prn  Cytotoxic Cerebral Edema  3% saline - 60 cc / hr  Na - 140  Other Stroke Risk Factors  Advanced age  Cigarette smoker - will be advised to stop smoking  ETOH use, will be advised to quit  Obesity, Body mass index is 29.73 kg/m., recommend weight loss, diet and exercise as appropriate   Hx stroke/TIA  Atrial fibrillation - anticoagulated  Other Active Problems  Sepsis - Zosyn started 02/04/17 - temp 99.3 ax. - WBCs - 16.1  Hypokalemia - 2.8 -> supplemented ->  recheck in a.m.  NPO -> tube feedings  Anemia - 9.1 / 29  Intubated on ventilator   Hospital day # 1  Delton See PA-C [facility] Pager [phone] 02/05/2017, 12:54 PM I have personally examined this patient, reviewed notes, independently viewed imaging studies, participated in medical decision making and plan of care.ROS completed by me personally and pertinent positives fully documented  I have made any additions or clarifications directly to the above note. Patient has presented with a very large intracerebral hemorrhage and prognosis is guarded. Recommend close neurological monitoring and strict blood pressure control. Continue hypertonic saline with sodium goal 150-155 and increased to 22 60 mL an hour. Check CT angiogram of the brain and neck tomorrow morning. No family available at the bedside for discussion. Start tube feeds. Keep normothermic and euglycemic. DVT and GI prophylaxis. This patient is critically ill and at significant risk of neurological worsening, death and care requires constant monitoring of vital signs, hemodynamics,respiratory and  cardiac monitoring, extensive review of multiple databases, frequent neurological assessment, discussion with family, other specialists and medical decision making of high complexity.I have made any additions or clarifications directly to the above note.This critical care time does not reflect procedure time, or teaching time or supervisory time of PA/NP/Med Resident etc but could involve care discussion time.  I spent 40 minutes of neurocritical care time  in the care of  this patient.      Delia Heady, MD Medical Director [facility] Pager: [phone] 02/05/2017 1:24 PM   To contact Stroke Continuity provider, please refer to [email or web address]. After hours, contact General Neurology

## 2017-02-06 DIAGNOSIS — I619 Nontraumatic intracerebral hemorrhage, unspecified: Secondary | ICD-10-CM

## 2017-02-06 DIAGNOSIS — Z7901 Long term (current) use of anticoagulants: Secondary | ICD-10-CM

## 2017-02-06 DIAGNOSIS — F1020 Alcohol dependence, uncomplicated: Secondary | ICD-10-CM

## 2017-02-06 DIAGNOSIS — F17200 Nicotine dependence, unspecified, uncomplicated: Secondary | ICD-10-CM

## 2017-02-06 DIAGNOSIS — Z9911 Dependence on respirator [ventilator] status: Secondary | ICD-10-CM

## 2017-02-06 DIAGNOSIS — I610 Nontraumatic intracerebral hemorrhage in hemisphere, subcortical: Secondary | ICD-10-CM

## 2017-02-06 DIAGNOSIS — I482 Chronic atrial fibrillation: Secondary | ICD-10-CM

## 2017-02-06 DIAGNOSIS — A419 Sepsis, unspecified organism: Secondary | ICD-10-CM

## 2017-02-06 LAB — GLUCOSE, CAPILLARY
GLUCOSE-CAPILLARY: 162 mg/dL — AB (ref 65–99)
GLUCOSE-CAPILLARY: 197 mg/dL — AB (ref 65–99)
Glucose-Capillary: 168 mg/dL — ABNORMAL HIGH (ref 65–99)
Glucose-Capillary: 133 mg/dL — ABNORMAL HIGH (ref 65–99)
Glucose-Capillary: 180 mg/dL — ABNORMAL HIGH (ref 65–99)
Glucose-Capillary: 183 mg/dL — ABNORMAL HIGH (ref 65–99)

## 2017-02-06 LAB — BASIC METABOLIC PANEL
ANION GAP: 5 (ref 5–15)
BUN: 17 mg/dL (ref 6–20)
CHLORIDE: 122 mmol/L — AB (ref 101–111)
CO2: 26 mmol/L (ref 22–32)
Calcium: 8.8 mg/dL — ABNORMAL LOW (ref 8.9–10.3)
Creatinine, Ser: 0.69 mg/dL (ref 0.61–1.24)
GFR calc non Af Amer: >60 mL/min (ref >60)
Glucose, Bld: 171 mg/dL — ABNORMAL HIGH (ref 65–99)
POTASSIUM: 3.3 mmol/L — AB (ref 3.5–5.1)
Sodium: 153 mmol/L — ABNORMAL HIGH (ref 135–145)

## 2017-02-06 LAB — CBC
HCT: 28.5 % — ABNORMAL LOW (ref 39.0–52.0)
Hemoglobin: 8.4 g/dL — ABNORMAL LOW (ref 13.0–17.0)
MCH: 26.6 pg (ref 26.0–34.0)
MCHC: 29.5 g/dL — ABNORMAL LOW (ref 30.0–36.0)
MCV: 90.2 fL (ref 78.0–100.0)
PLATELETS: 260 10*3/uL (ref 150–400)
RBC: 3.16 MIL/uL — AB (ref 4.22–5.81)
RDW: 17 % — ABNORMAL HIGH (ref 11.5–15.5)
WBC: 9.7 10*3/uL (ref 4.0–10.5)

## 2017-02-06 LAB — SODIUM
Sodium: 151 mmol/L — ABNORMAL HIGH (ref 135–145)
Sodium: 153 mmol/L — ABNORMAL HIGH (ref 135–145)
Sodium: 157 mmol/L — ABNORMAL HIGH (ref 135–145)
Sodium: 160 mmol/L — ABNORMAL HIGH (ref 135–145)

## 2017-02-06 LAB — MAGNESIUM
Magnesium: 2.3 mg/dL (ref 1.7–2.4)
Magnesium: 2.3 mg/dL (ref 1.7–2.4)

## 2017-02-06 LAB — PHOSPHORUS
PHOSPHORUS: 2.2 mg/dL — AB (ref 2.5–4.6)
PHOSPHORUS: 2.8 mg/dL (ref 2.5–4.6)

## 2017-02-06 LAB — PROTIME-INR
INR: 1.23
PROTHROMBIN TIME: 15.4 s — AB (ref 11.4–15.2)

## 2017-02-06 MED ORDER — PRO-STAT SUGAR FREE PO LIQD
30.0000 mL | Freq: Every day | ORAL | Status: DC
  Administered 2017-02-07 – 2017-02-16 (×9): 30 mL
  Filled 2017-02-06 (×9): qty 30

## 2017-02-06 MED ORDER — ALBUTEROL SULFATE (2.5 MG/3ML) 0.083% IN NEBU
2.5000 mg | INHALATION_SOLUTION | RESPIRATORY_TRACT | Status: DC | PRN
  Administered 2017-02-06: 2.5 mg via RESPIRATORY_TRACT
  Filled 2017-02-06: qty 3

## 2017-02-06 MED ORDER — VITAL 1.5 CAL PO LIQD
1000.0000 mL | ORAL | Status: DC
  Administered 2017-02-06 – 2017-02-16 (×11): 1000 mL
  Filled 2017-02-06 (×16): qty 1000

## 2017-02-06 MED ORDER — FENTANYL 2500MCG IN NS 250ML (10MCG/ML) PREMIX INFUSION
25.0000 ug/h | INTRAVENOUS | Status: DC
  Administered 2017-02-06: 50 ug/h via INTRAVENOUS
  Administered 2017-02-07: 150 ug/h via INTRAVENOUS
  Administered 2017-02-07: 200 ug/h via INTRAVENOUS
  Administered 2017-02-08: 75 ug/h via INTRAVENOUS
  Administered 2017-02-09: 100 ug/h via INTRAVENOUS
  Administered 2017-02-10: 50 ug/h via INTRAVENOUS
  Filled 2017-02-06 (×6): qty 250

## 2017-02-06 MED ORDER — POTASSIUM CHLORIDE 20 MEQ/15ML (10%) PO SOLN
20.0000 meq | ORAL | Status: AC
  Administered 2017-02-06 (×2): 20 meq
  Filled 2017-02-06 (×2): qty 15

## 2017-02-06 MED ORDER — CEFTRIAXONE SODIUM 1 G IJ SOLR
1.0000 g | INTRAMUSCULAR | Status: AC
  Administered 2017-02-06 – 2017-02-12 (×7): 1 g via INTRAVENOUS
  Filled 2017-02-06 (×7): qty 10

## 2017-02-06 MED ORDER — FREE WATER
200.0000 mL | Freq: Four times a day (QID) | Status: DC
  Administered 2017-02-06 – 2017-02-07 (×3): 200 mL

## 2017-02-06 MED ORDER — ACETYLCYSTEINE 20 % IN SOLN
4.0000 mL | Freq: Two times a day (BID) | RESPIRATORY_TRACT | Status: AC
  Administered 2017-02-06 – 2017-02-07 (×4): 4 mL via RESPIRATORY_TRACT
  Filled 2017-02-06 (×5): qty 4

## 2017-02-06 NOTE — Progress Notes (Signed)
Na resulted at 160. Holding 3% saline. Currently on tube feeds without free water flushes. Starting free water flushes to prevent further increase in Na level. Na goal is 150-155 for ICH with mass effect. CT head from today reviewed.   Electronically signed: Dr. Caryl Pina

## 2017-02-06 NOTE — Progress Notes (Signed)
Initial Nutrition Assessment  DOCUMENTATION CODES:   Obesity unspecified  INTERVENTION:   Vital 1.5  rate of 26ml/hr  Prostat 30ml daily   Regimen provides 2260kcal/day, 112g/day protein, 1191ml/day free water   MVI daily   NUTRITION DIAGNOSIS:   Inadequate oral intake related to inability to eat as evidenced by NPO status.  GOAL:   Provide needs based on ASPEN/SCCM guidelines  MONITOR:   Vent status, Labs, Weight trends, TF tolerance  REASON FOR ASSESSMENT:   Consult Enteral/tube feeding initiation and management  ASSESSMENT:   61 yo male smoker brought to ER with altered mental status.  Found to have larged Rt parietal ICH with IVH and 1.7 cm midline shift with hydrocephalus.  Has hx of a fib on xarelto.  Intubated for airway, and given Kcentra.  Had recent admission (9/29 to 10/04) for alcohol withdrawal.  PMHx COPD, DM, HTN.   Pt intubated. No family at bedside. Per chart, pt appears to have lost 20lbs over the past year; RD unsure how recent weight loss is. OGT in place. Pt tolerating tube feeds well. Pt with hypokalemia; monitor and supplement as needed per MD discretion.   Medications reviewed and include: folic acid, insulin, MVI, protonix, thiamine, ceftriaxone, fentanyl, NaCl 3% /hr   Labs reviewed: Na 157(H), K 3.3(L), Cl 122(H), Ca 8.8(L), P 2.2(L), Mg 2.3 wnl Hgb 8.4(L), Hct 28.5(L) cbgs- 168, 175, 171 x 24 hrs  Nutrition-Focused physical exam completed. Findings are no fat depletion, moderate muscle depletions in LE, and mild edema in UE.    Patient is currently intubated on ventilator support MV: 10.3 L/min Temp (24hrs), Avg:99.1 F (37.3 C), Min:98.2 F (36.8 C), Max:100.1 F (37.8 C)  Propofol: none  MAP- >35mmHg  Diet Order:  Diet NPO time specified  Skin:  Reviewed, no issues  Last BM:  10/4- TYPE 6  Height:   Ht Readings from Last 1 Encounters:  02/06/17  (1.778 m)    Weight:   Wt Readings from Last 1  Encounters:  02/06/17 208 lb 5.4 oz (94.5 kg)    Ideal Body Weight:  75.4 kg  BMI:  Body mass index is 29.89 kg/m.  Estimated Nutritional Needs:   Kcal:  2105kcal/day   Protein:  104-123g/day   Fluid:  >2L/day   EDUCATION NEEDS:   Education needs no appropriate at this time  Betsey Holiday MS, RD, LDN Pager #[phone] After Hours Pager: [phone]

## 2017-02-06 NOTE — Progress Notes (Signed)
eLink Physician-Brief Progress Note Patient Name: Stuart Mendoza DOB: Nov 12, 1955 MRN: [id number]   Date of Service  02/06/2017  HPI/Events of Note  Na+ = 160 - Patient is on 3% NaCl for cerebral edema. Neurology does not want to stop or decrease 3% NaCl IV infusion. Neurology wants to add free water per tube and requests that PCCM write order.  eICU Interventions  Will order: 1. Free water 200 mL per tube Q 6 hours.  2. Further management of hypertonic therapy per Neurology service.      Intervention Category Major Interventions: Electrolyte abnormality - evaluation and management  Sommer,Steven Eugene 02/06/2017, 8:00 PM

## 2017-02-06 NOTE — Progress Notes (Signed)
BP above 140. Cardene increased to max at 0745. Labetelol given at 0800. Will need sedation to achieve desired BP.

## 2017-02-06 NOTE — Progress Notes (Signed)
[facility] ADULT ICU REPLACEMENT PROTOCOL FOR AM LAB REPLACEMENT ONLY  The patient does apply for the [facility] Adult ICU Electrolyte Replacment Protocol based on the criteria listed below:   1. Is GFR >/= 40 ml/min? Yes.    Patient's GFR today is >60 2. Is urine output >/= 0.5 ml/kg/hr for the last 6 hours? Yes.   Patient's UOP is 1.3 ml/kg/hr 3. Is BUN < 60 mg/dL? Yes.    Patient's BUN today is 17 4. Abnormal electrolyte(s): 3.3 5. Ordered repletion with: per protocol 6. If a panic level lab has been reported, has the CCM MD in charge been notified? Yes.  .   Physician:  Dr. Janne Lab, Dixon Boos 02/06/2017 5:57 AM

## 2017-02-06 NOTE — Care Management Note (Signed)
Case Management Note  Patient Details  Name: Stuart Mendoza MRN: [id number] Date of Birth: 1956/04/17  Subjective/Objective:    Large ICH, HTN, DM          Action/Plan: Discharge Planning: Chart reviewed. Pt is from [facility]. Pt's contact Dtr, [name] # [phone] or Brother, [name]. Will continue to follow SNF vs Palliative. CSW following for SNF.    Expected Discharge Date:                 Expected Discharge Plan:  Skilled Nursing Facility  In-House Referral:  Clinical Social Work  Discharge planning Services  CM Consult  Post Acute Care Choice:  NA Choice offered to:  NA   Status of Service:  In process, will continue to follow  If discussed at Long Length of Stay Meetings, dates discussed:    Additional Comments:  Elliot Cousin, RN 02/06/2017, 3:40 PM

## 2017-02-06 NOTE — Progress Notes (Signed)
PULMONARY / CRITICAL CARE MEDICINE   Name: Stuart Mendoza MRN: [id number] DOB: [birth year]    ADMISSION DATE:  02/04/2017  REFERRING MD:  Dr. Sheria Lang, ER  CHIEF COMPLAINT:  Altered mental status  HISTORY OF PRESENT ILLNESS:   61 yo male smoker brought to ER with altered mental status.  Found to have larged Rt parietal ICH with IVH and 1.7 cm midline shift with hydrocephalus.  Has hx of a fib on xarelto.  Intubated for airway, and given Kcentra.  Had recent admission (9/29 to 10/04) for alcohol withdrawal.  PMHx COPD, DM, HTN.  SUBJECTIVE:  Lung open, follows commands on rt  VITAL SIGNS: BP (!) 145/73   Pulse (!) 116   Temp 98.9 F (37.2 C) (Axillary)   Resp 18   Ht  (1.778 m)   Wt 94.5 kg (208 lb 5.4 oz)   SpO2 100%   BMI 29.89 kg/m   VENTILATOR SETTINGS: Vent Mode: PSV;CPAP FiO2 (%):  [40 %] 40 % Set Rate:  [14 bmp] 14 bmp Vt Set:  [620 mL] 620 mL PEEP:  [5 cmH20-8 cmH20] 8 cmH20 Pressure Support:  [10 cmH20] 10 cmH20 Plateau Pressure:  [16 cmH20-17 cmH20] 16 cmH20  INTAKE / OUTPUT: I/O last 3 completed shifts: In: 4357.2 [I.V.:3131.2; NG/GT:726; IV Piggyback:500] Out: 3500 [Urine:3500]  PHYSICAL EXAMINATION:  General:calm on vent Neuro:  perr 2 mm, rass -1, follows commands on rt HEENT: ett PULM: ronchi but aerates rt CV: s1 s2 RRR no  GI: soft, BS wnl, no r Extremities: no edema, no rash    LABS:  BMET  Recent Labs Lab 02/05/17 0514  02/05/17 1941 02/05/17 2328 02/06/17 0410 02/06/17 0545  NA 140  < > 145 151* 153* 153*  K 2.8*  --  2.9*  --  3.3*  --   CL 106  --  115*  --  122*  --   CO2 24  --  24  --  26  --   BUN 12  --  15  --  17  --   CREATININE 0.65  --  0.68  --  0.69  --   GLUCOSE 168*  --  175*  --  171*  --   < > = values in this interval not displayed.  Electrolytes  Recent Labs Lab 02/05/17 0514 02/05/17 1231 02/05/17 1820 02/05/17 1941 02/06/17 0410  CALCIUM 8.7*  --   --  8.6* 8.8*  MG 2.1 2.0 2.3  --  2.3   PHOS 2.4* 2.2* 1.6*  --  2.2*    CBC  Recent Labs Lab 02/04/17 1001 02/05/17 0514 02/06/17 0410  WBC 16.2* 16.1* 9.7  HGB 11.2* 9.1* 8.4*  HCT 35.0* 29.0* 28.5*  PLT 321 278 260    Coag's  Recent Labs Lab 02/05/17 0107 02/05/17 0641 02/06/17 0410  INR 1.23 1.12 1.23    Sepsis Markers  Recent Labs Lab 02/04/17 1021  LATICACIDVEN 1.94*    ABG  Recent Labs Lab 02/04/17 1045 02/04/17 1637 02/05/17 0405  PHART 7.502* 7.484* 7.479*  PCO2ART 36.4 32.6 33.8  PO2ART 236.0* 81.0* 127*    Liver Enzymes  Recent Labs Lab 02/04/17 1001  AST 21  ALT 18  ALKPHOS 99  BILITOT 2.4*  ALBUMIN 3.7    Cardiac Enzymes No results for input(s): TROPONINI, PROBNP in the last 168 hours.  Glucose  Recent Labs Lab 02/05/17 1139 02/05/17 1729 02/05/17 2017 02/05/17 2349 02/06/17 0347 02/06/17 0803  GLUCAP 147* 203* 171* 166*  168* 162*    Imaging No results found.   STUDIES:  CT head 10/6 >> Large ICH Rt parietal lobe.  1.7 cm of midline shift.  Small amount of hemorrhage in the Lt frontal lobe and a small amount of subdural blood.  CULTURES: Blood 10/6 >> Coag neg staph Urine 10/6 >> negative Sputum 10/6 >>  ANTIBIOTICS: Vancomycin 10/6 >>10/8 Zosyn 10/6 >> 10/7 CTX 10/8>>>  SIGNIFICANT EVENTS: 10/6 Admit, neurology, neurosurgery consulted, kcentra, 3% NS  LINES/TUBES: ETT 10/06 >>   ASSESSMENT / PLAN:  ICH, IVH. - 3% NS per neurology  - appears to have a horrible prognosis ,massive bleed -if needed use propofol -no asa, no anticoagulation -WUA -pain assessment  Hypertension. A fib. - goal SBP < 140 per neurology -rate control  Acute respiratory failure with compromised airway. Aspiration pneumonia. Hx of COPD. - collapse is now open, keep peep 8 -ensure chest pt remains q4h -add mucomyst's to keep open x 24 hours -weaning okay PS 10 required and Peep 8-10 -if survives? would need early trach -pcxr in am   Hx of ETOH. -  thiamine, folic acid, MVI  DM type II. - SSI  Hypokalemia. - replace as needed  Anemia of critical illness and chronic disease. - f/u CBC  Aspiration PNA -coag neg staph is contamination -dc vanc done, dc zosyn -add ctx for genral asp   GI Feed to goal ppi  Will look to update family  Ccm time 93 muin    Mcarthur Rossetti. Tyson Alias, MD, FACP Pgr: [phone] [facility]

## 2017-02-06 NOTE — Progress Notes (Signed)
STROKE TEAM PROGRESS NOTE   HISTORY OF PRESENT ILLNESS (per record)  Stuart Mendoza is a 61 y.o. male with a past medical history of atrial fibrillation on XARELTO, hypertension, alcohol abuse and recent history of frequent falls, discharged on 02/02/2017 after having a long hospital course for acute metabolic encephalopathy secondary to alcohol withdrawal, was in skilled nursing facility for rehabilitation where he had increasing confusion over the last 2 days, and had increased work of breathing and brought back to the ER for evaluation of those. A noncontrast CT scan of the head was done that showed a large right parietal intraparenchymal hemorrhage, with IVH, with 1.7 cm midline shift and hydrocephalus. He was a GCS of 4 on arrival. At the time of my examination, he had this been intubated little while ago and had sedatives and paralytics on board.   LKW: 48 hours ago tpa given?: no, ICH Premorbid modified Rankin scale (mRS): 3  ICH Score:  4   SUBJECTIVE (INTERVAL HISTORY) No family members present. The patient follows commands. He remains intubated and is mildly agitated off sedation. His blood is well controlled but has intermittently been elevated.   OBJECTIVE Temp:  [98.2 F (36.8 C)-100.1 F (37.8 C)] 100.1 F (37.8 C) (10/08 0800) Pulse Rate:  [34-137] 116 (10/08 0730) Cardiac Rhythm: Atrial fibrillation (10/08 0400) Resp:  [10-27] 18 (10/08 0730) BP: (117-188)/(56-109) 145/73 (10/08 0730) SpO2:  [96 %-100 %] 100 % (10/08 1138) FiO2 (%):  [40 %] 40 % (10/08 1138) Weight:  [208 lb 5.4 oz (94.5 kg)] 208 lb 5.4 oz (94.5 kg) (10/08 0300)  CBC:   Recent Labs Lab 02/04/17 1001 02/05/17 0514 02/06/17 0410  WBC 16.2* 16.1* 9.7  NEUTROABS 14.3*  --   --   HGB 11.2* 9.1* 8.4*  HCT 35.0* 29.0* 28.5*  MCV 85.6 86.8 90.2  PLT 321 278 260    Basic Metabolic Panel:   Recent Labs Lab 02/05/17 1820 02/05/17 1941  02/06/17 0410 02/06/17 0545  NA 146* 145  < >  153* 153*  K  --  2.9*  --  3.3*  --   CL  --  115*  --  122*  --   CO2  --  24  --  26  --   GLUCOSE  --  175*  --  171*  --   BUN  --  15  --  17  --   CREATININE  --  0.68  --  0.69  --   CALCIUM  --  8.6*  --  8.8*  --   MG 2.3  --   --  2.3  --   PHOS 1.6*  --   --  2.2*  --   < > = values in this interval not displayed.  Lipid Panel:     Component Value Date/Time   TRIG 55 02/04/2017 1001   HgbA1c:  Lab Results  Component Value Date   HGBA1C 5.7 (H) 02/04/2017   Urine Drug Screen:     Component Value Date/Time   LABOPIA POSITIVE (A) 01/29/2017 0259   COCAINSCRNUR NONE DETECTED 01/29/2017 0259   LABBENZ POSITIVE (A) 01/29/2017 0259   AMPHETMU NONE DETECTED 01/29/2017 0259   THCU NONE DETECTED 01/29/2017 0259   LABBARB NONE DETECTED 01/29/2017 0259    Alcohol Level     Component Value Date/Time   ETH <10 02/04/2017 1001    IMAGING  Ct Head Wo Contrast 02/04/2017 IMPRESSION:  Stable large 7.3 x 5.8 x 7.4  cm intraparenchymal hemorrhage in the right parietal lobe with intraventricular rupture.  Stable persistent 17 mm of right to left midline shift.  Persistent mild dilatation of the temporal horn of the right lateral ventricle without significant hydrocephalus.   Ct Head Wo Contrast 02/04/2017 IMPRESSION:  Large intracranial hemorrhage centered in the right parietal lobe.  1.7 cm of midline shift with intraventricular blood.  Small amount of hemorrhage in the left frontal lobe and a small amount of subdural blood.   Dg Chest Port 1 View  02/04/2017 IMPRESSION:  1. Near complete re-expansion of the right lung with some residual airspace disease at the right base.  2. The support apparatus is stable.   Dg Chest Port 1 View 02/04/2017 IMPRESSION:  1. New complete opacification of the right hemithorax with volume loss, which could reflect large central mucous plugging and collapse, and/or new pleural effusion.  2. Appropriate positioning of the endotracheal  tube.   Dg Chest Portable 1 View 02/04/2017 IMPRESSION:  Right upper lobe infiltrate and collapse.  Endotracheal tube tip 4 cm above the carina.  Nasogastric tube in the stomach.     PHYSICAL EXAM Vitals:   02/06/17 0730 02/06/17 0751 02/06/17 0800 02/06/17 1138  BP: (!) 145/73     Pulse: (!) 116     Resp: 18     Temp:   100.1 F (37.8 C)   TempSrc:   Axillary   SpO2: 100% 100%  100%  Weight:      Height:       Physical exam: Exam: Gen: Intubated, not in acute distress, does not keep eyes open spontaneously but attending to movement and commands MSK: Right arm and knee showing multiple contusions        Neuro: Cognition:   Follows one step verbal commands Cranial Nerves:   Eyes are deviated to the right and do not cross midline. Flinches to visual threat on right. Left facial droop Motor Observation:    Not moving spontaneously but moves right hand and right foot to verbal command, 0/5 strength not moving left side Reflex Exam:   Downward babinski on right upward on left, withdraws in lower extremities b/l, cough and gag reflex present   ASSESSMENT/PLAN [name] is a 61 y.o. male with history of a previous stroke, hypertension, alcohol abuse, diabetes, COPD, and atrial fibrillation on Xarelto with recent falls presenting with increased confusion with increased work of breathing. He did not receive IV t-PA due to ICH.  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 17 mm of right to left midline shift.   MRI head - not performed  MRA head - not performed  Carotid Doppler - not performed  2D Echo - not performed  LDL - not performed  HgbA1c - not performed  VTE prophylaxis - SCDs Diet NPO time specified  aspirin 81 mg daily and Xarelto (rivaroxaban) daily prior to admission, now on No  antithrombotic  Ongoing aggressive stroke risk factor management  Therapy recommendations: pending  Disposition: Pending  Hypertension  Unstable - blood pressure still somewhat high - labetalol and Cardene prn goal <140 SBP now  Long-term BP goal normotensive  Hyperlipidemia  Home meds: No lipid lowering medications prior to admission  Diabetes mellitus  Hemoglobin A1c - 5.7  Controlled  Sliding scale insulin - prn  Cytotoxic Cerebral Edema  3% saline - 60 cc /  hr  Na - 153 at goal 150-155  Other Stroke Risk Factors  Advanced age  Cigarette smoker - will be advised to stop smoking  ETOH use, will be advised to quit  Obesity, Body mass index is 29.89 kg/m., recommend weight loss, diet and exercise as appropriate   Hx stroke/TIA  Atrial fibrillation - anticoagulated  Other Active Problems  Sepsis - Zosyn started 02/04/17 WBCs - 16.1 -> 9.7  Hypokalemia - 3.3 -> supplemented  Tube feedings  Intubated on ventilator   His hemorrhage is large and this event likely unsurvivable (97% 30 day mortality with ICH score of 4). He is showing good participation and following commands at this time but we anticipate further neurological worsening and prognosis is poor.  Hospital day # 2    To contact Stroke Continuity provider, please refer to [email or web address]. After hours, contact General Neurology

## 2017-02-07 ENCOUNTER — Inpatient Hospital Stay (HOSPITAL_COMMUNITY): Payer: Medicare Other

## 2017-02-07 DIAGNOSIS — Z9911 Dependence on respirator [ventilator] status: Secondary | ICD-10-CM

## 2017-02-07 DIAGNOSIS — J690 Pneumonitis due to inhalation of food and vomit: Secondary | ICD-10-CM

## 2017-02-07 LAB — CBC
HEMATOCRIT: 30.9 % — AB (ref 39.0–52.0)
Hemoglobin: 8.9 g/dL — ABNORMAL LOW (ref 13.0–17.0)
MCH: 27.1 pg (ref 26.0–34.0)
MCHC: 28.8 g/dL — AB (ref 30.0–36.0)
MCV: 94.2 fL (ref 78.0–100.0)
Platelets: 249 10*3/uL (ref 150–400)
RBC: 3.28 MIL/uL — ABNORMAL LOW (ref 4.22–5.81)
RDW: 17.7 % — AB (ref 11.5–15.5)
WBC: 12.1 10*3/uL — ABNORMAL HIGH (ref 4.0–10.5)

## 2017-02-07 LAB — GLUCOSE, CAPILLARY
GLUCOSE-CAPILLARY: 139 mg/dL — AB (ref 65–99)
Glucose-Capillary: 153 mg/dL — ABNORMAL HIGH (ref 65–99)
Glucose-Capillary: 173 mg/dL — ABNORMAL HIGH (ref 65–99)
Glucose-Capillary: 161 mg/dL — ABNORMAL HIGH (ref 65–99)
Glucose-Capillary: 145 mg/dL — ABNORMAL HIGH (ref 65–99)
Glucose-Capillary: 194 mg/dL — ABNORMAL HIGH (ref 65–99)

## 2017-02-07 LAB — BASIC METABOLIC PANEL
ANION GAP: 6 (ref 5–15)
Anion gap: 4 — ABNORMAL LOW (ref 5–15)
BUN: 16 mg/dL (ref 6–20)
BUN: 16 mg/dL (ref 6–20)
CALCIUM: 8.9 mg/dL (ref 8.9–10.3)
CHLORIDE: 128 mmol/L — AB (ref 101–111)
CO2: 26 mmol/L (ref 22–32)
CO2: 29 mmol/L (ref 22–32)
CREATININE: 0.8 mg/dL (ref 0.61–1.24)
Calcium: 8.6 mg/dL — ABNORMAL LOW (ref 8.9–10.3)
Chloride: 125 mmol/L — ABNORMAL HIGH (ref 101–111)
Creatinine, Ser: 0.76 mg/dL (ref 0.61–1.24)
GFR calc non Af Amer: >60 mL/min (ref >60)
GFR calc non Af Amer: >60 mL/min (ref >60)
Glucose, Bld: 188 mg/dL — ABNORMAL HIGH (ref 65–99)
Glucose, Bld: 168 mg/dL — ABNORMAL HIGH (ref 65–99)
POTASSIUM: 2.8 mmol/L — AB (ref 3.5–5.1)
Potassium: 3.4 mmol/L — ABNORMAL LOW (ref 3.5–5.1)
SODIUM: 158 mmol/L — AB (ref 135–145)
Sodium: 160 mmol/L — ABNORMAL HIGH (ref 135–145)

## 2017-02-07 LAB — CULTURE, BLOOD (ROUTINE X 2): SPECIAL REQUESTS: ADEQUATE

## 2017-02-07 LAB — SODIUM
SODIUM: 161 mmol/L — AB (ref 135–145)
Sodium: 153 mmol/L — ABNORMAL HIGH (ref 135–145)
Sodium: 162 mmol/L (ref 135–145)

## 2017-02-07 MED ORDER — POTASSIUM CHLORIDE 10 MEQ/50ML IV SOLN
10.0000 meq | INTRAVENOUS | Status: AC
  Administered 2017-02-07 (×7): 10 meq via INTRAVENOUS
  Filled 2017-02-07 (×6): qty 50

## 2017-02-07 MED ORDER — DILTIAZEM HCL 100 MG IV SOLR
5.0000 mg/h | INTRAVENOUS | Status: DC
  Administered 2017-02-07: 5 mg/h via INTRAVENOUS
  Administered 2017-02-07: 15 mg/h via INTRAVENOUS
  Administered 2017-02-08 (×2): 7.5 mg/h via INTRAVENOUS
  Administered 2017-02-09: 10 mg/h via INTRAVENOUS
  Filled 2017-02-07 (×6): qty 100

## 2017-02-07 MED ORDER — FREE WATER
200.0000 mL | Status: DC
  Administered 2017-02-07 – 2017-02-09 (×12): 200 mL

## 2017-02-07 NOTE — Progress Notes (Signed)
STROKE TEAM PROGRESS NOTE   SUBJECTIVE (INTERVAL HISTORY) No family members present. The patient follows some commands sluggishly. He remains intubated and is mildly agitated/vent desynchronous off sedation. His blood is well controlled today. 3% saline was discontinued for sodium up to 162 o/n now getting free water.   OBJECTIVE Temp:  [97.8 F (36.6 C)-99.7 F (37.6 C)] 99.1 F (37.3 C) (10/09 1200) Pulse Rate:  [28-142] 101 (10/09 1105) Cardiac Rhythm: Atrial fibrillation (10/09 0400) Resp:  [0-15] 14 (10/09 1105) BP: (106-150)/(57-88) 136/85 (10/09 1105) SpO2:  [98 %-100 %] 100 % (10/09 1319) FiO2 (%):  [40 %] 40 % (10/09 1319) Weight:  [216 lb 11.4 oz (98.3 kg)] 216 lb 11.4 oz (98.3 kg) (10/09 0300)  CBC:   Recent Labs Lab 02/04/17 1001  02/06/17 0410 02/07/17 1143  WBC 16.2*  < > 9.7 12.1*  NEUTROABS 14.3*  --   --   --   HGB 11.2*  < > 8.4* 8.9*  HCT 35.0*  < > 28.5* 30.9*  MCV 85.6  < > 90.2 94.2  PLT 321  < > 260 249  < > = values in this interval not displayed.  Basic Metabolic Panel:   Recent Labs Lab 02/06/17 0410  02/06/17 1824  02/07/17 0353 02/07/17 1143  NA 153*  < > 160*  < > 160* 161*  K 3.3*  --   --   --  2.8*  --   CL 122*  --   --   --  128*  --   CO2 26  --   --   --  26  --   GLUCOSE 171*  --   --   --  188*  --   BUN 17  --   --   --  16  --   CREATININE 0.69  --   --   --  0.76  --   CALCIUM 8.8*  --   --   --  8.6*  --   MG 2.3  --  2.3  --   --   --   PHOS 2.2*  --  2.8  --   --   --   < > = values in this interval not displayed.  Lipid Panel:     Component Value Date/Time   TRIG 55 02/04/2017 1001   HgbA1c:  Lab Results  Component Value Date   HGBA1C 5.7 (H) 02/04/2017   Urine Drug Screen:     Component Value Date/Time   LABOPIA POSITIVE (A) 01/29/2017 0259   COCAINSCRNUR NONE DETECTED 01/29/2017 0259   LABBENZ POSITIVE (A) 01/29/2017 0259   AMPHETMU NONE DETECTED 01/29/2017 0259   THCU NONE DETECTED 01/29/2017 0259    LABBARB NONE DETECTED 01/29/2017 0259    Alcohol Level     Component Value Date/Time   ETH <10 02/04/2017 1001    IMAGING  Ct Head Wo Contrast 02/04/2017 IMPRESSION:  Stable large 7.3 x 5.8 x 7.4 cm intraparenchymal hemorrhage in the right parietal lobe with intraventricular rupture.  Stable persistent 17 mm of right to left midline shift.  Persistent mild dilatation of the temporal horn of the right lateral ventricle without significant hydrocephalus.   Ct Head Wo Contrast 02/04/2017 IMPRESSION:  Large intracranial hemorrhage centered in the right parietal lobe.  1.7 cm of midline shift with intraventricular blood.  Small amount of hemorrhage in the left frontal lobe and a small amount of subdural blood.   Dg Chest [facility] [address]  02/04/2017 IMPRESSION:  1. Near complete re-expansion of the right lung with some residual airspace disease at the right base.  2. The support apparatus is stable.   Dg Chest Port 1 View 02/04/2017 IMPRESSION:  1. New complete opacification of the right hemithorax with volume loss, which could reflect large central mucous plugging and collapse, and/or new pleural effusion.  2. Appropriate positioning of the endotracheal tube.   Dg Chest Portable 1 View 02/04/2017 IMPRESSION:  Right upper lobe infiltrate and collapse.  Endotracheal tube tip 4 cm above the carina.  Nasogastric tube in the stomach.     PHYSICAL EXAM Vitals:   02/07/17 0820 02/07/17 1105 02/07/17 1200 02/07/17 1319  BP: 131/61 136/85    Pulse: (!) 104 (!) 101    Resp: 15 14    Temp:   99.1 F (37.3 C)   TempSrc:   Axillary   SpO2: 100% 100%  100%  Weight:      Height:       General - Overweight, multiple bruises  Across R side (R humerus fracture)  Mental Status -  Awake eyes open but lethargic, on ventilator Understands some commands, perseveration of commands during exam  Cranial Nerves II - XII - II - Eyes open but not attending III, IV, VI - Mid and right gaze  preference, less than yesterday, no movement to commands V -  VII - Face symmetric VIII - Follows verbal commands X -  XI -  XII -   Motor Strength - Weak movement of right hand and toes to command  Motor Tone - Muscle tone was normal, left hemiplegia   ASSESSMENT/PLAN [name] is a 61 y.o. male with history of a previous stroke, hypertension, alcohol abuse, diabetes, COPD, and atrial fibrillation on Xarelto with recent falls presenting with increased confusion with increased work of breathing. He did not receive IV t-PA due to ICH.  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 17 mm of right to left midline shift.   MRI head - not performed  MRA head - not performed  Carotid Doppler - not performed  2D Echo - not performed  LDL - not performed  HgbA1c - not performed  VTE prophylaxis - SCDs Diet NPO time specified  aspirin 81 mg daily and Xarelto (rivaroxaban) daily prior to admission, now on No antithrombotic  Ongoing aggressive stroke risk factor management  Therapy recommendations: pending  Disposition: Pending  Hypertension  Unstable - blood pressure still somewhat high - labetalol and Cardene prn goal <140 SBP now  Long-term BP goal normotensive  Hyperlipidemia  Home meds: No lipid lowering medications prior to admission  Diabetes mellitus  Hemoglobin A1c - 5.7  Controlled  Sliding scale insulin - prn  Cytotoxic Cerebral Edema  3% saline stopped for overshoot to 162  Free water q4hr  Na - goal 150-155  Repeat CT head today  Other Stroke Risk Factors  Advanced age  Cigarette smoker - will be advised to stop smoking  ETOH use, will be advised to quit  Obesity, Body mass index is 31.09 kg/m., recommend weight loss, diet and exercise as appropriate   Hx  stroke/TIA  Atrial fibrillation no anticoagulation  Other Active Problems  Sepsis - Zosyn started 02/04/17  Tube feedings  Intubated on ventilator   Right humerus fracture  He is spontaneously awake with eyes open  but is somewhat more lethargic and perseverating to orders. Hold further 3% saline but rapid correction is not needed. Lethargy could be associated with additional edema so repeat CT head today to monitor known 17mm midline shift. Prognosis remains poor based on large volume hemorrhage on presentation with ICH score of 4.  Hospital day # 3    To contact Stroke Continuity provider, please refer to [email or web address]. After hours, contact General Neurology

## 2017-02-07 NOTE — Progress Notes (Signed)
PULMONARY / CRITICAL CARE MEDICINE   Name: Calahan Pak MRN: [id number] DOB: 01/02/1956    ADMISSION DATE:  02/04/2017  REFERRING MD:  Dr. Sheria Lang, ER  CHIEF COMPLAINT:  Altered mental status  HISTORY OF PRESENT ILLNESS:   61 yo male smoker brought to ER with altered mental status.  Found to have larged Rt parietal ICH with IVH and 1.7 cm midline shift with hydrocephalus.  Has hx of a fib on xarelto.  Intubated for airway, and given Kcentra.  Had recent admission (9/29 to 10/04) for alcohol withdrawal.  PMHx COPD, DM, HTN.  SUBJECTIVE:  No desatuation Remains on vent Secretions are thick 3% overshoot, free water started  VITAL SIGNS: BP 131/61   Pulse (!) 104   Temp 98.6 F (37 C)   Resp 15   Ht  (1.778 m)   Wt 98.3 kg (216 lb 11.4 oz)   SpO2 100%   BMI 31.09 kg/m   VENTILATOR SETTINGS: Vent Mode: PRVC FiO2 (%):  [40 %] 40 % Set Rate:  [14 bmp] 14 bmp Vt Set:  [620 mL] 620 mL PEEP:  [8 cmH20] 8 cmH20 Pressure Support:  [10 cmH20-12 cmH20] 12 cmH20 Plateau Pressure:  [15 cmH20-19 cmH20] 16 cmH20  INTAKE / OUTPUT: I/O last 3 completed shifts: In: 6161 [I.V.:4072; NG/GT:1839; IV Piggyback:250] Out: 4300 [Urine:4300]  PHYSICAL EXAMINATION:  General: on vent, calm Neuro: rass -1, FC, moves his rt  ext HEENT:perrl, jvd present, ett PULM: coarse rt improved, reduced CV: s1 s2 IRT mild GI: soft, obese, nt, nd Extremities: edema min to none     LABS:  BMET  Recent Labs Lab 02/05/17 1941  02/06/17 0410  02/06/17 1824 02/06/17 2344 02/07/17 0353  NA 145  < > 153*  < > 160* 162* 160*  K 2.9*  --  3.3*  --   --   --  2.8*  CL 115*  --  122*  --   --   --  128*  CO2 24  --  26  --   --   --  26  BUN 15  --  17  --   --   --  16  CREATININE 0.68  --  0.69  --   --   --  0.76  GLUCOSE 175*  --  171*  --   --   --  188*  < > = values in this interval not displayed.  Electrolytes  Recent Labs Lab 02/05/17 1820 02/05/17 1941 02/06/17 0410  02/06/17 1824 02/07/17 0353  CALCIUM  --  8.6* 8.8*  --  8.6*  MG 2.3  --  2.3 2.3  --   PHOS 1.6*  --  2.2* 2.8  --     CBC  Recent Labs Lab 02/04/17 1001 02/05/17 0514 02/06/17 0410  WBC 16.2* 16.1* 9.7  HGB 11.2* 9.1* 8.4*  HCT 35.0* 29.0* 28.5*  PLT 321 278 260    Coag's  Recent Labs Lab 02/05/17 0107 02/05/17 0641 02/06/17 0410  INR 1.23 1.12 1.23    Sepsis Markers  Recent Labs Lab 02/04/17 1021  LATICACIDVEN 1.94*    ABG  Recent Labs Lab 02/04/17 1045 02/04/17 1637 02/05/17 0405  PHART 7.502* 7.484* 7.479*  PCO2ART 36.4 32.6 33.8  PO2ART 236.0* 81.0* 127*    Liver Enzymes  Recent Labs Lab 02/04/17 1001  AST 21  ALT 18  ALKPHOS 99  BILITOT 2.4*  ALBUMIN 3.7    Cardiac Enzymes No results for input(s):  TROPONINI, PROBNP in the last 168 hours.  Glucose  Recent Labs Lab 02/06/17 1143 02/06/17 1555 02/06/17 1933 02/06/17 2312 02/07/17 0310 02/07/17 0748  GLUCAP 197* 133* 180* 183* 153* 139*    Imaging Dg Chest Port 1 View  Result Date: 02/07/2017 CLINICAL DATA:  Ventilator EXAM: PORTABLE CHEST 1 VIEW COMPARISON:  02/04/2017 FINDINGS: Support devices are stable. Cardiomegaly with vascular congestion. Perihilar and bibasilar opacities noted. Mild interstitial prominence could reflect interstitial edema. Question small left effusion. IMPRESSION: Vascular congestion with possible mild interstitial edema. Areas of perihilar and bibasilar opacities, likely atelectasis. Electronically Signed   By: Charlett Nose M.D.   On: 02/07/2017 07:12     STUDIES:  CT head 10/6 >> Large ICH Rt parietal lobe.  1.7 cm of midline shift.  Small amount of hemorrhage in the Lt frontal lobe and a small amount of subdural blood.  CULTURES: Blood 10/6 >> 1/2 Coag neg staph Urine 10/6 >> negative Sputum 10/6 >>  ANTIBIOTICS: Vancomycin 10/6 >>10/8 Zosyn 10/6 >> 10/7 CTX 10/8>>>  SIGNIFICANT EVENTS: 10/6 Admit, neurology, neurosurgery consulted,  kcentra, 3% NS  LINES/TUBES: ETT 10/06 >>   ASSESSMENT / PLAN:  NEURO ICH, IVH, vent dyschrony - 3% NS per neurology held -head of bed elevation -controlling BP, Cardene being used, consider use Cardizem to sys less 140 goal per neuro -fent with wua -may need to consider propofol  [facility] Hypertension. A fib with RVR - goal SBP < 140 per neurology -rate control with cardizem, dc cardene  PULM Acute respiratory failure with compromised airway Aspiration pneumonia. Hx of COPD. Collapse rt lung, imrpoved -lung remains open, peep to 5 -okay to wean cpap 5 ps5, goal 2 hours -may need trach as poor airway control Maintain mucomysts, chest pt x 24 hours -pcxr in am   Hx of ETOH. - thiamine, folic acid, MVI  DM type II. - SSI  renal Hypokalemia. - replace K , bmet in am -kvo -3% off , Na went up k in pm  Free water till na in range again  Anemia of critical illness and chronic disease. - f/u CBC  Aspiration PNA -coag neg staph is contamination -CTX for totoal goal 8 days abx  GI Feed to goal ppi   Ccm time 35 muin    Shahad Mazurek J. Tyson Alias, MD, FACP Pgr: [phone] [facility]

## 2017-02-07 NOTE — Plan of Care (Signed)
Problem: Education: Goal: Knowledge of disease or condition will improve Outcome: Not Progressing No visitors 10/8 or 10/9, pt unable to learn at this time

## 2017-02-07 NOTE — Progress Notes (Signed)
eLink Physician-Brief Progress Note Patient Name: Arian Murley DOB: 1955/11/26 MRN: [id number]   Date of Service  02/07/2017  HPI/Events of Note  K+ = 2.8 and Creatinine = 0.76.   eICU Interventions  Will replace K+.      Intervention Category Major Interventions: Electrolyte abnormality - evaluation and management  Torris House Eugene 02/07/2017, 5:12 AM

## 2017-02-07 NOTE — Progress Notes (Signed)
3% NS infusion stopped per MD orders.  Will continue free water flushes to decrease Na levels to within desired range.

## 2017-02-08 ENCOUNTER — Inpatient Hospital Stay (HOSPITAL_COMMUNITY): Payer: Medicare Other

## 2017-02-08 DIAGNOSIS — G936 Cerebral edema: Secondary | ICD-10-CM

## 2017-02-08 LAB — GLUCOSE, CAPILLARY
GLUCOSE-CAPILLARY: 206 mg/dL — AB (ref 65–99)
GLUCOSE-CAPILLARY: 193 mg/dL — AB (ref 65–99)
GLUCOSE-CAPILLARY: 182 mg/dL — AB (ref 65–99)
Glucose-Capillary: 208 mg/dL — ABNORMAL HIGH (ref 65–99)
Glucose-Capillary: 191 mg/dL — ABNORMAL HIGH (ref 65–99)
Glucose-Capillary: 200 mg/dL — ABNORMAL HIGH (ref 65–99)

## 2017-02-08 LAB — BASIC METABOLIC PANEL
Anion gap: 7 (ref 5–15)
BUN: 17 mg/dL (ref 6–20)
CHLORIDE: 120 mmol/L — AB (ref 101–111)
CO2: 30 mmol/L (ref 22–32)
CREATININE: 0.9 mg/dL (ref 0.61–1.24)
Calcium: 8.6 mg/dL — ABNORMAL LOW (ref 8.9–10.3)
GFR calc non Af Amer: >60 mL/min (ref >60)
Glucose, Bld: 227 mg/dL — ABNORMAL HIGH (ref 65–99)
POTASSIUM: 3.4 mmol/L — AB (ref 3.5–5.1)
Sodium: 157 mmol/L — ABNORMAL HIGH (ref 135–145)

## 2017-02-08 LAB — CBC
HEMATOCRIT: 31.1 % — AB (ref 39.0–52.0)
Hemoglobin: 8.7 g/dL — ABNORMAL LOW (ref 13.0–17.0)
MCH: 26.7 pg (ref 26.0–34.0)
MCHC: 28 g/dL — ABNORMAL LOW (ref 30.0–36.0)
MCV: 95.4 fL (ref 78.0–100.0)
PLATELETS: 214 10*3/uL (ref 150–400)
RBC: 3.26 MIL/uL — AB (ref 4.22–5.81)
RDW: 17.5 % — ABNORMAL HIGH (ref 11.5–15.5)
WBC: 11.4 10*3/uL — AB (ref 4.0–10.5)

## 2017-02-08 LAB — SODIUM
Sodium: 154 mmol/L — ABNORMAL HIGH (ref 135–145)
Sodium: 155 mmol/L — ABNORMAL HIGH (ref 135–145)

## 2017-02-08 MED ORDER — LABETALOL HCL 5 MG/ML IV SOLN
10.0000 mg | INTRAVENOUS | Status: DC | PRN
  Administered 2017-02-08 – 2017-02-16 (×21): 10 mg via INTRAVENOUS
  Filled 2017-02-08 (×21): qty 4

## 2017-02-08 MED ORDER — LABETALOL HCL 5 MG/ML IV SOLN
10.0000 mg | INTRAVENOUS | Status: DC | PRN
  Administered 2017-02-08: 10 mg via INTRAVENOUS
  Filled 2017-02-08 (×2): qty 4

## 2017-02-08 MED ORDER — POTASSIUM CHLORIDE 20 MEQ/15ML (10%) PO SOLN
40.0000 meq | Freq: Once | ORAL | Status: AC
  Administered 2017-02-08: 40 meq
  Filled 2017-02-08: qty 30

## 2017-02-08 NOTE — Plan of Care (Signed)
Problem: Education: Goal: Knowledge of disease or condition will improve Outcome: Not Progressing Patient only able to follow simple commands at this time.

## 2017-02-08 NOTE — Progress Notes (Signed)
PULMONARY / CRITICAL CARE MEDICINE   Name: Stuart Mendoza MRN: [id number] DOB: [birth year]    ADMISSION DATE:  02/04/2017  REFERRING MD:  Dr. Sheria Lang, ER  CHIEF COMPLAINT:  Altered mental status  HISTORY OF PRESENT ILLNESS:   61 yo male smoker brought to ER with altered mental status.  Found to have larged Rt parietal ICH with IVH and 1.7 cm midline shift with hydrocephalus.  Has hx of a fib on xarelto.  Intubated for airway, and given Kcentra.  Had recent admission (9/29 to 10/04) for alcohol withdrawal.  PMHx COPD, DM, HTN.  SUBJECTIVE:  No acute events overnight, remains on ventilator, tolerating well.  Remains on free water after Na over goal with 3%    VITAL SIGNS: BP 140/82   Pulse (!) 54   Temp 99.2 F (37.3 C) (Axillary)   Resp 10   Ht  (1.778 m)   Wt 218 lb 0.6 oz (98.9 kg)   SpO2 100%   BMI 31.28 kg/m   VENTILATOR SETTINGS: Vent Mode: CPAP;PSV FiO2 (%):  [30 %-40 %] 30 % Set Rate:  [14 bmp] 14 bmp Vt Set:  [620 mL] 620 mL PEEP:  [5 cmH20] 5 cmH20 Pressure Support:  [10 cmH20] 10 cmH20 Plateau Pressure:  [15 cmH20-17 cmH20] 16 cmH20  INTAKE / OUTPUT: I/O last 3 completed shifts: In: 5380.2 [I.V.:2531.2; NG/GT:2699; IV Piggyback:150] Out: 4625 [Urine:4625]  PHYSICAL EXAMINATION:  General: intubated, calm  Neuro: FC,  HEENT:perrl, ETT secured in place  PULM: coarse and diminished breath sounds  CV: s1 s2, Irregular rhythm, rate controlled  GI: soft, obese, no tenderness  Extremities: slight edema    LABS:  BMET  Recent Labs Lab 02/07/17 0353  02/07/17 1823 02/07/17 2315 02/08/17 0339  NA 160*  < > 158* 153* 157*  K 2.8*  --  3.4*  --  3.4*  CL 128*  --  125*  --  120*  CO2 26  --  29  --  30  BUN 16  --  16  --  17  CREATININE 0.76  --  0.80  --  0.90  GLUCOSE 188*  --  168*  --  227*  < > = values in this interval not displayed.  Electrolytes  Recent Labs Lab 02/05/17 1820  02/06/17 0410 02/06/17 1824 02/07/17 0353  02/07/17 1823 02/08/17 0339  CALCIUM  --   < > 8.8*  --  8.6* 8.9 8.6*  MG 2.3  --  2.3 2.3  --   --   --   PHOS 1.6*  --  2.2* 2.8  --   --   --   < > = values in this interval not displayed.  CBC  Recent Labs Lab 02/06/17 0410 02/07/17 1143 02/08/17 0339  WBC 9.7 12.1* 11.4*  HGB 8.4* 8.9* 8.7*  HCT 28.5* 30.9* 31.1*  PLT 260 249 214    Coag's  Recent Labs Lab 02/05/17 0107 02/05/17 0641 02/06/17 0410  INR 1.23 1.12 1.23    Sepsis Markers  Recent Labs Lab 02/04/17 1021  LATICACIDVEN 1.94*    ABG  Recent Labs Lab 02/04/17 1045 02/04/17 1637 02/05/17 0405  PHART 7.502* 7.484* 7.479*  PCO2ART 36.4 32.6 33.8  PO2ART 236.0* 81.0* 127*    Liver Enzymes  Recent Labs Lab 02/04/17 1001  AST 21  ALT 18  ALKPHOS 99  BILITOT 2.4*  ALBUMIN 3.7    Cardiac Enzymes No results for input(s): TROPONINI, PROBNP in the last 168 hours.  Glucose  Recent Labs Lab 02/07/17 1145 02/07/17 1544 02/07/17 1933 02/07/17 2317 02/08/17 0310 02/08/17 0739  GLUCAP 173* 161* 145* 194* 206* 193*    Imaging Ct Head Wo Contrast  Result Date: 02/07/2017 CLINICAL DATA:  Intracranial hemorrhage.  Follow-up. EXAM: CT HEAD WITHOUT CONTRAST TECHNIQUE: Contiguous axial images were obtained from the base of the skull through the vertex without intravenous contrast. COMPARISON:  02/04/2017 FINDINGS: Brain: Large parenchymal hematoma centered in the right parietal lobe has not significantly changed in size, measuring 7.8 x 5.9 x 6.9 cm (estimated volume of 159 cc). Intraventricular extension is unchanged with blood in the right greater than left lateral ventricles. Mild dilatation of the temporal horn of the right lateral ventricle is unchanged. Small hemorrhagic contusions in the anterior left frontal lobe are unchanged, with the larger measuring 8 mm. Edema surrounding the right parietal hematoma is unchanged. Midline shift does not appear significantly changed upon  remeasurement, currently 13 mm. Moderate encephalomalacia is again noted in the anterior frontal lobes bilaterally. Vascular: Calcified atherosclerosis at the skullbase. No hyperdense vessel. Skull: No fracture or focal osseous lesion. Sinuses/Orbits: No significant inflammatory disease in the visualized paranasal sinuses. Trace bilateral mastoid effusions. Unremarkable orbits. Other: None. IMPRESSION: 1. Unchanged large right parietal hematoma, surrounding edema, and midline shift. 2. Unchanged small hemorrhagic contusion in the anterior left frontal lobe. 3. Unchanged intraventricular blood with mild right temporal horn dilatation. Electronically Signed   By: Sebastian Ache M.D.   On: 02/07/2017 16:56   Dg Chest Port 1 View  Result Date: 02/08/2017 CLINICAL DATA:  On mechanically assisted ventilation. EXAM: PORTABLE CHEST 1 VIEW COMPARISON:  02/07/2017 FINDINGS: Endotracheal tube is 5.0 cm above the carina. Nasogastric tube extends into the abdomen. Left subclavian central line tip in the upper SVC region. The heart size remains enlarged. Persistent densities in the lower chest bilaterally. Negative for a pneumothorax. Surgical plates in left ribs. IMPRESSION: Stable chest radiograph findings. Persistent bibasilar chest densities. Chest densities may represent a combination of pleural fluid and atelectasis/ consolidation. Support apparatuses as described. Stable cardiomegaly. Electronically Signed   By: Richarda Overlie M.D.   On: 02/08/2017 07:52     STUDIES:  CT head 10/6 >> Large ICH Rt parietal lobe.  1.7 cm of midline shift.  Small amount of hemorrhage in the Lt frontal lobe and a small amount of subdural blood. CXR 10/10> Stable appearance, atelectasis  CULTURES: Blood 10/6 >> 1/2 Coag neg staph Urine 10/6 >> negative Sputum 10/6 >>  ANTIBIOTICS: Vancomycin 10/6 >>10/8 Zosyn 10/6 >> 10/7 CTX 10/8>>>  SIGNIFICANT EVENTS: 10/6 Admit, neurology, neurosurgery consulted, kcentra, 3%  NS  LINES/TUBES: ETT 10/06 >>   ASSESSMENT / PLAN:  NEURO ICH, IVH, vent dyschrony - 3% NS being held per neurology  -head of bed elevation for ICP  -fentanyl sedation with daily awakenings, RASS goal -1  CVS Hypertension. A fib with RVR - goal SBP < 140 per neurology - Improved rate control and BP with Cardizem gtt, continue to titrate   PULM Acute respiratory failure with compromised airway Aspiration pneumonia. Hx of COPD. Collapse rt lung, imrpoved -On vent, currently on PS 10/5, continue to wean as tolerated  -may consider trach depending on airway protection  -mucomyst complete    Hx of ETOH. - thiamine, folic acid, MVI  DM type II. - SSI  renal Hypokalemia. - K supplementation as needed, monitor BMP  - 3% being held - Free water 200 q4 hrs to correct Na   Anemia  of critical illness and chronic disease. - f/u CBC  Aspiration PNA -coag neg staph is contamination -CTX for total goal 8 days abx (10/14 stop date)  GI Feed to goal ppi for sup  STAFF NOTE: I, Rory Percy, MD FACP have personally reviewed patient's available data, including medical history, events of note, physical examination and test results as part of my evaluation. I have discussed with resident/NP and other care providers such as pharmacist, RN and RRT. In addition, I personally evaluated patient and elicited key findings of: awakens, fc for RN, per, hemiplegic, lungs coarse, reduced, abdo obese, soft, edema, mild, no rash, pcxr which I reviewed shows bibasilar atx, rt lung remains open, CT head I also reviewed shows unchanged bleed and blood in ventricles, given size of bleed its remarkable his neuro status has not declines, ensure peep to 5 and dc mucomyst, dc chest pt, overnight he is rate controlled better with Cardizem, weaning today SBT ps 10 to goal 5 if able, I think he needs trach maybe Friday if he continues to have similar unchanged neurostatus, 3% continues to hold with Na  noted, may need d5w on top of free water, hope Na will fall again slowly, hypokalemia, re assess chem, maintain current free water, coags wnl, no family at bedside The patient is critically ill with multiple organ systems failure and requires high complexity decision making for assessment and support, frequent evaluation and titration of therapies, application of advanced monitoring technologies and extensive interpretation of multiple databases.   Critical Care Time devoted to patient care services described in this note is 35 Minutes. This time reflects time of care of this signee: Rory Percy, MD FACP. This critical care time does not reflect procedure time, or teaching time or supervisory time of PA/NP/Med student/Med Resident etc but could involve care discussion time. Rest per NP/medical resident whose note is outlined above and that I agree with   Mcarthur Rossetti. Tyson Alias, MD, FACP Pgr: [phone] [facility] 02/08/2017 9:41 AM  ]

## 2017-02-08 NOTE — Progress Notes (Signed)
STROKE TEAM PROGRESS NOTE   SUBJECTIVE (INTERVAL HISTORY) No family members present. Remains intubated this morning. Repeat head CT yesterday showed no significant change in extent of edema around his very large hemorrhage. He is following some commands today including thumb, gaze, and toe movements.    OBJECTIVE Temp:  [99.2 F (37.3 C)-101.5 F (38.6 C)] 100.1 F (37.8 C) (10/10 1158) Pulse Rate:  [38-147] 105 (10/10 1430) Cardiac Rhythm: Atrial fibrillation (10/10 1200) Resp:  [6-34] 13 (10/10 1430) BP: (111-169)/(55-102) 148/93 (10/10 1430) SpO2:  [97 %-100 %] 98 % (10/10 1430) FiO2 (%):  [10 %-40 %] 10 % (10/10 1200) Weight:  [218 lb 0.6 oz (98.9 kg)] 218 lb 0.6 oz (98.9 kg) (10/10 0345)  CBC:   Recent Labs Lab 02/04/17 1001  02/07/17 1143 02/08/17 0339  WBC 16.2*  < > 12.1* 11.4*  NEUTROABS 14.3*  --   --   --   HGB 11.2*  < > 8.9* 8.7*  HCT 35.0*  < > 30.9* 31.1*  MCV 85.6  < > 94.2 95.4  PLT 321  < > 249 214  < > = values in this interval not displayed.  Basic Metabolic Panel:   Recent Labs Lab 02/06/17 0410  02/06/17 1824  02/07/17 1823  02/08/17 0339 02/08/17 1138  NA 153*  < > 160*  < > 158*  < > 157* 154*  K 3.3*  --   --   < > 3.4*  --  3.4*  --   CL 122*  --   --   < > 125*  --  120*  --   CO2 26  --   --   < > 29  --  30  --   GLUCOSE 171*  --   --   < > 168*  --  227*  --   BUN 17  --   --   < > 16  --  17  --   CREATININE 0.69  --   --   < > 0.80  --  0.90  --   CALCIUM 8.8*  --   --   < > 8.9  --  8.6*  --   MG 2.3  --  2.3  --   --   --   --   --   PHOS 2.2*  --  2.8  --   --   --   --   --   < > = values in this interval not displayed.  Lipid Panel:     Component Value Date/Time   TRIG 55 02/04/2017 1001   HgbA1c:  Lab Results  Component Value Date   HGBA1C 5.7 (H) 02/04/2017   Urine Drug Screen:     Component Value Date/Time   LABOPIA POSITIVE (A) 01/29/2017 0259   COCAINSCRNUR NONE DETECTED 01/29/2017 0259   LABBENZ POSITIVE  (A) 01/29/2017 0259   AMPHETMU NONE DETECTED 01/29/2017 0259   THCU NONE DETECTED 01/29/2017 0259   LABBARB NONE DETECTED 01/29/2017 0259    Alcohol Level     Component Value Date/Time   ETH <10 02/04/2017 1001    IMAGING  Ct Head Wo Contrast 02/07/2017 1. Unchanged large right parietal hematoma, surrounding edema, and midline shift. 2. Unchanged small hemorrhagic contusion in the anterior left frontal lobe. 3. Unchanged intraventricular blood with mild right temporal horn Dilatation. 02/04/2017 Stable large 7.3 x 5.8 x 7.4 cm intraparenchymal hemorrhage in the right parietal lobe with intraventricular rupture.  Stable  persistent 17 mm of right to left midline shift.  Persistent mild dilatation of the temporal horn of the right lateral ventricle without significant hydrocephalus.  02/04/2017 Large intracranial hemorrhage centered in the right parietal lobe.  1.7 cm of midline shift with intraventricular blood.  Small amount of hemorrhage in the left frontal lobe and a small amount of subdural blood.   Dg Chest Port 1 View  02/08/2017 Stable chest radiograph findings. Persistent bibasilar chest densities. Chest densities may represent a combination of pleural fluid and atelectasis/ consolidation. Support apparatuses as described. Stable cardiomegaly. 02/04/2017 1. Near complete re-expansion of the right lung with some residual airspace disease at the right base.  2. The support apparatus is stable.  02/04/2017 1. New complete opacification of the right hemithorax with volume loss, which could reflect large central mucous plugging and collapse, and/or new pleural effusion.  2. Appropriate positioning of the endotracheal tube.  02/04/2017 Right upper lobe infiltrate and collapse.  Endotracheal tube tip 4 cm above the carina.  Nasogastric tube in the stomach.     PHYSICAL EXAM Vitals:   02/08/17 1330 02/08/17 1345 02/08/17 1400 02/08/17 1430  BP: (!) 158/88 (!) 149/88 (!) 147/83  (!) 148/93  Pulse: (!) 114 99 84 (!) 105  Resp: Temp:      TempSrc:      SpO2: 100% 98% 100% 98%  Weight:      Height:       General - Overweight, multiple bruises on RUE (R humerus fracture)  Mental Status -  Opens eyes to voice and remains open, on ventilator Perseveration of commands during exam  Cranial Nerves II - XII - II - Eyes open but not attending, flinch ot R visual threat III, IV, VI - Mid and right gaze preference, some not tracking to commands VII - Face symmetric ETT in place VIII - Follows some verbal commands  Motor Strength - RUE strength minimal but also limited by mental status, raises thumb and moves toes to command  Motor Tone - Muscle tone was normal, left hemiplegia   ASSESSMENT/PLAN Stuart Mendoza is a 61 y.o. male with history of a previous stroke, hypertension, alcohol abuse, diabetes, COPD, and atrial fibrillation on Xarelto with recent falls presenting with increased confusion with increased work of breathing. He did not receive IV t-PA due to ICH.  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 17 mm of right to left midline shift.   MRI head - not performed  MRA head - not performed  Carotid Doppler - not performed  2D Echo - not performed  LDL - not performed  HgbA1c - not performed  VTE prophylaxis - SCDs Diet NPO time specified  aspirin 81 mg daily and Xarelto (rivaroxaban) daily prior to admission, now on No antithrombotic  Ongoing aggressive stroke risk factor management  Therapy recommendations: pending  Disposition: Pending  Hypertension  Unstable - blood pressure still somewhat high - labetalol prn goal <160 SBP now 4 days out  Long-term BP goal normotensive  Hyperlipidemia  Home meds: No lipid lowering medications prior to admission  Diabetes  mellitus  Hemoglobin A1c - 5.7  Controlled  Sliding scale insulin - prn  Cytotoxic Cerebral Edema  3% saline stopped for overshoot to 162 02/06/2017  Free water q4hr  Na - goal 150-155  Other Stroke Risk Factors  Advanced age  Cigarette smoker - will be advised to stop smoking  ETOH use, will be advised to quit  Obesity, Body mass index is 31.28 kg/m., recommend weight loss, diet and exercise as appropriate   Hx stroke/TIA  Atrial fibrillation no anticoagulation  Other Active Problems  Sepsis - Zosyn started 02/04/17  Tube feedings  Intubated on ventilator   Right humerus fracture  Mental status is slightly better than yesterday. His condition is better than anticipated given such large volume of hematoma but remains at risk for numerous complications. Extent and timing of future neurological improvement is unclear. Sodium down to goal range today with free water and holding 3% saline.  Hospital day # 4    To contact Stroke Continuity provider, please refer to [email or web address]. After hours, contact General Neurology

## 2017-02-09 DIAGNOSIS — I10 Essential (primary) hypertension: Secondary | ICD-10-CM

## 2017-02-09 LAB — CBC
HCT: 32.6 % — ABNORMAL LOW (ref 39.0–52.0)
HEMOGLOBIN: 9.2 g/dL — AB (ref 13.0–17.0)
MCH: 26.4 pg (ref 26.0–34.0)
MCHC: 28.2 g/dL — ABNORMAL LOW (ref 30.0–36.0)
MCV: 93.7 fL (ref 78.0–100.0)
Platelets: 204 10*3/uL (ref 150–400)
RBC: 3.48 MIL/uL — AB (ref 4.22–5.81)
RDW: 16.8 % — ABNORMAL HIGH (ref 11.5–15.5)
WBC: 14.5 10*3/uL — AB (ref 4.0–10.5)

## 2017-02-09 LAB — BASIC METABOLIC PANEL
ANION GAP: 5 (ref 5–15)
BUN: 16 mg/dL (ref 6–20)
CALCIUM: 8.5 mg/dL — AB (ref 8.9–10.3)
CO2: 31 mmol/L (ref 22–32)
Chloride: 113 mmol/L — ABNORMAL HIGH (ref 101–111)
Creatinine, Ser: 0.78 mg/dL (ref 0.61–1.24)
Glucose, Bld: 218 mg/dL — ABNORMAL HIGH (ref 65–99)
Potassium: 3.3 mmol/L — ABNORMAL LOW (ref 3.5–5.1)
SODIUM: 149 mmol/L — AB (ref 135–145)

## 2017-02-09 LAB — CULTURE, BLOOD (ROUTINE X 2)
CULTURE: NO GROWTH
Special Requests: ADEQUATE

## 2017-02-09 LAB — GLUCOSE, CAPILLARY
GLUCOSE-CAPILLARY: 215 mg/dL — AB (ref 65–99)
GLUCOSE-CAPILLARY: 186 mg/dL — AB (ref 65–99)
GLUCOSE-CAPILLARY: 263 mg/dL — AB (ref 65–99)
GLUCOSE-CAPILLARY: 182 mg/dL — AB (ref 65–99)
Glucose-Capillary: 176 mg/dL — ABNORMAL HIGH (ref 65–99)
Glucose-Capillary: 162 mg/dL — ABNORMAL HIGH (ref 65–99)

## 2017-02-09 LAB — SODIUM
SODIUM: 149 mmol/L — AB (ref 135–145)
Sodium: 152 mmol/L — ABNORMAL HIGH (ref 135–145)

## 2017-02-09 MED ORDER — HEPARIN SODIUM (PORCINE) 5000 UNIT/ML IJ SOLN
5000.0000 [IU] | Freq: Three times a day (TID) | INTRAMUSCULAR | Status: DC
  Administered 2017-02-09 – 2017-02-26 (×51): 5000 [IU] via SUBCUTANEOUS
  Filled 2017-02-09 (×51): qty 1

## 2017-02-09 MED ORDER — ETOMIDATE 2 MG/ML IV SOLN
40.0000 mg | Freq: Once | INTRAVENOUS | Status: AC
  Administered 2017-02-10: 20 mg via INTRAVENOUS
  Filled 2017-02-09: qty 20

## 2017-02-09 MED ORDER — PROPOFOL 500 MG/50ML IV EMUL
5.0000 ug/kg/min | Freq: Once | INTRAVENOUS | Status: AC
  Administered 2017-02-10: 20 ug/kg/min via INTRAVENOUS
  Filled 2017-02-09: qty 50

## 2017-02-09 MED ORDER — FENTANYL CITRATE (PF) 100 MCG/2ML IJ SOLN
200.0000 ug | Freq: Once | INTRAMUSCULAR | Status: AC
  Administered 2017-02-10: 200 ug via INTRAVENOUS
  Filled 2017-02-09: qty 4

## 2017-02-09 MED ORDER — MIDAZOLAM HCL 2 MG/2ML IJ SOLN
4.0000 mg | Freq: Once | INTRAMUSCULAR | Status: AC
  Administered 2017-02-10: 4 mg via INTRAVENOUS
  Filled 2017-02-09: qty 4

## 2017-02-09 MED ORDER — POTASSIUM CHLORIDE 20 MEQ/15ML (10%) PO SOLN
20.0000 meq | ORAL | Status: AC
  Administered 2017-02-09 (×3): 20 meq
  Filled 2017-02-09 (×3): qty 15

## 2017-02-09 MED ORDER — VECURONIUM BROMIDE 10 MG IV SOLR
10.0000 mg | Freq: Once | INTRAVENOUS | Status: AC
  Administered 2017-02-10: 10 mg via INTRAVENOUS
  Filled 2017-02-09: qty 10

## 2017-02-09 MED ORDER — DILTIAZEM 12 MG/ML ORAL SUSPENSION
30.0000 mg | Freq: Four times a day (QID) | ORAL | Status: DC
  Administered 2017-02-09 – 2017-02-15 (×24): 30 mg
  Filled 2017-02-09 (×26): qty 3

## 2017-02-09 NOTE — Progress Notes (Signed)
PULMONARY / CRITICAL CARE MEDICINE   Name: Oluwatimilehin Balfour MRN: [id number] DOB: 05-Mar-1956    ADMISSION DATE:  02/04/2017  REFERRING MD:  Dr. Sheria Lang, ER  CHIEF COMPLAINT:  Altered mental status  HISTORY OF PRESENT ILLNESS:   61 yo male smoker brought to ER with altered mental status.  Found to have larged Rt parietal ICH with IVH and 1.7 cm midline shift with hydrocephalus.  Has hx of a fib on xarelto.  Intubated for airway, and given Kcentra.  Had recent admission (9/29 to 10/04) for alcohol withdrawal.  PMHx COPD, DM, HTN.  SUBJECTIVE:  Remains with same neuro status, out of proportionately good for his scan Peep 11 but only set at 5  VITAL SIGNS: BP (!) 134/108   Pulse (!) 101   Temp 100.3 F (37.9 C)   Resp 14   Ht  (1.778 m)   Wt 99.1 kg (218 lb 7.6 oz)   SpO2 98%   BMI 31.35 kg/m   VENTILATOR SETTINGS: Vent Mode: PRVC FiO2 (%):  [10 %-40 %] 30 % Set Rate:  [14 bmp] 14 bmp Vt Set:  [620 mL] 620 mL PEEP:  [5 cmH20] 5 cmH20 Pressure Support:  [8 cmH20] 8 cmH20 Plateau Pressure:  [15 cmH20-17 cmH20] 17 cmH20  INTAKE / OUTPUT: I/O last 3 completed shifts: In: 3977.3 [I.V.:907.3; NG/GT:3020; IV Piggyback:50] Out: 4150 [Urine:4150]  PHYSICAL EXAMINATION:  General: awakens follows commands Neuro: rass -2, fc, calm most of the time HEENT: ett wnl PULM: slight coarse CV: s1 s2 RRR GI: soft, BS wnl, no r Extremities: edema 1 plus legs    LABS:  BMET  Recent Labs Lab 02/07/17 1823  02/08/17 0339  02/08/17 1756 02/08/17 2324 02/09/17 0429  NA 158*  < > 157*  < > 155* 152* 149*  K 3.4*  --  3.4*  --   --   --  3.3*  CL 125*  --  120*  --   --   --  113*  CO2 29  --  30  --   --   --  31  BUN 16  --  17  --   --   --  16  CREATININE 0.80  --  0.90  --   --   --  0.78  GLUCOSE 168*  --  227*  --   --   --  218*  < > = values in this interval not displayed.  Electrolytes  Recent Labs Lab 02/05/17 1820  02/06/17 0410 02/06/17 1824   02/07/17 1823 02/08/17 0339 02/09/17 0429  CALCIUM  --   < > 8.8*  --   < > 8.9 8.6* 8.5*  MG 2.3  --  2.3 2.3  --   --   --   --   PHOS 1.6*  --  2.2* 2.8  --   --   --   --   < > = values in this interval not displayed.  CBC  Recent Labs Lab 02/07/17 1143 02/08/17 0339 02/09/17 0429  WBC 12.1* 11.4* 14.5*  HGB 8.9* 8.7* 9.2*  HCT 30.9* 31.1* 32.6*  PLT 249 214 204    Coag's  Recent Labs Lab 02/05/17 0107 02/05/17 0641 02/06/17 0410  INR 1.23 1.12 1.23    Sepsis Markers  Recent Labs Lab 02/04/17 1021  LATICACIDVEN 1.94*    ABG  Recent Labs Lab 02/04/17 1045 02/04/17 1637 02/05/17 0405  PHART 7.502* 7.484* 7.479*  PCO2ART 36.4 32.6 33.8  PO2ART 236.0* 81.0* 127*    Liver Enzymes  Recent Labs Lab 02/04/17 1001  AST 21  ALT 18  ALKPHOS 99  BILITOT 2.4*  ALBUMIN 3.7    Cardiac Enzymes No results for input(s): TROPONINI, PROBNP in the last 168 hours.  Glucose  Recent Labs Lab 02/08/17 1126 02/08/17 1609 02/08/17 1940 02/08/17 2311 02/09/17 0314 02/09/17 0725  GLUCAP 208* 191* 200* 182* 215* 186*    Imaging No results found.   STUDIES:  CT head 10/6 >> Large ICH Rt parietal lobe.  1.7 cm of midline shift.  Small amount of hemorrhage in the Lt frontal lobe and a small amount of subdural blood. CXR 10/10> Stable appearance, atelectasis  CULTURES: Blood 10/6 >> 1/2 Coag neg staph Urine 10/6 >> negative Sputum 10/6 >>  ANTIBIOTICS: Vancomycin 10/6 >>10/8 Zosyn 10/6 >> 10/7 CTX 10/8>>>  SIGNIFICANT EVENTS: 10/6 Admit, neurology, neurosurgery consulted, kcentra, 3% NS  LINES/TUBES: ETT 10/06 >>   ASSESSMENT / PLAN:  NEURO ICH, IVH, vent dyschrony - WUA with fentnayl  CVS Hypertension. A fib with RVR - goal SBP < 140 per neurology -responded well to cardizem  -consider oral load 30 q6h  PULM Acute respiratory failure with compromised airway Aspiration pneumonia. Hx of COPD. Collapse rt lung, imrpoved -pcxr  in am , lung has remained reasonably up after collapse He lacks airway protection and failed weaning this am  Peep recorded 11 but set 5 and not appearing as autopeep Change vent Trach required in am   Hx of ETOH. - thiamine, folic acid, MVI  DM type II. - SSI  renal Hypokalemia. - K supplementation , bemt in am  - 3% off, na re assess, 149 acceptable - Free water 200 q4 hrs to correct Na - consider dc this today  Anemia of critical illness and chronic disease. - f/u CBC coags wnl fo rtrach  Aspiration PNA -CTX for total goal 8 days abx (10/14 stop date)  GI Feed to goal done, hold 9 am  ppi for sup  Ccm time 35 min   I updated daughter in full  Mcarthur Rossetti. Tyson Alias, MD, FACP Pgr: [phone] [facility] 02/09/2017 9:21 AM  ]

## 2017-02-09 NOTE — Progress Notes (Signed)
eLink Physician-Brief Progress Note Patient Name: Neithan Day DOB: 02/05/1956 MRN: [id number]   Date of Service  02/09/2017  HPI/Events of Note  Hypokalemia  eICU Interventions  Potassium replaced     Intervention Category Intermediate Interventions: Electrolyte abnormality - evaluation and management  DETERDING,ELIZABETH 02/09/2017, 6:02 AM

## 2017-02-09 NOTE — Progress Notes (Signed)
[facility] ADULT ICU REPLACEMENT PROTOCOL FOR AM LAB REPLACEMENT ONLY  The patient does not apply for the [facility] Adult ICU Electrolyte Replacment Protocol based on the criteria listed below:     Is urine output >/= 0.5 ml/kg/hr for the last 6 hours? No. Patient's UOP is 0 ml/kg/hr   If a panic level lab has been reported, has the CCM MD in charge been notified? Yes.  .   Physician:  Holland Commons, MD  Melrose Nakayama 02/09/2017 5:54 AM

## 2017-02-09 NOTE — Progress Notes (Signed)
Pt had 12 beat run VT at about 15:20. Back to AFib, rate 100. BP 149/79, sat 100. MD paged to notify. Will cont to monitor.

## 2017-02-09 NOTE — Progress Notes (Signed)
STROKE TEAM PROGRESS NOTE   SUBJECTIVE (INTERVAL HISTORY) No family members present. Remains intubated this morning. He is following some commands but still not adequate mental status for therapy. He is planned for tracheostomy tomorrow AM.   OBJECTIVE Temp:  [98.3 F (36.8 C)-100.5 F (38.1 C)] 100.5 F (38.1 C) (10/11 1212) Pulse Rate:  [38-128] 92 (10/11 1126) Cardiac Rhythm: Atrial fibrillation (10/11 0700) Resp:  [10-16] 15 (10/11 1126) BP: (127-178)/(66-108) 130/66 (10/11 1126) SpO2:  [98 %-100 %] 99 % (10/11 1126) FiO2 (%):  [30 %-40 %] 30 % (10/11 1126) Weight:  [218 lb 7.6 oz (99.1 kg)] 218 lb 7.6 oz (99.1 kg) (10/11 0445)  CBC:   Recent Labs Lab 02/04/17 1001  02/08/17 0339 02/09/17 0429  WBC 16.2*  < > 11.4* 14.5*  NEUTROABS 14.3*  --   --   --   HGB 11.2*  < > 8.7* 9.2*  HCT 35.0*  < > 31.1* 32.6*  MCV 85.6  < > 95.4 93.7  PLT 321  < > 214 204  < > = values in this interval not displayed.  Basic Metabolic Panel:   Recent Labs Lab 02/06/17 0410  02/06/17 1824  02/08/17 0339  02/09/17 0429 02/09/17 1322  NA 153*  < > 160*  < > 157*  < > 149* 149*  K 3.3*  --   --   < > 3.4*  --  3.3*  --   CL 122*  --   --   < > 120*  --  113*  --   CO2 26  --   --   < > 30  --  31  --   GLUCOSE 171*  --   --   < > 227*  --  218*  --   BUN 17  --   --   < > 17  --  16  --   CREATININE 0.69  --   --   < > 0.90  --  0.78  --   CALCIUM 8.8*  --   --   < > 8.6*  --  8.5*  --   MG 2.3  --  2.3  --   --   --   --   --   PHOS 2.2*  --  2.8  --   --   --   --   --   < > = values in this interval not displayed.  Lipid Panel:     Component Value Date/Time   TRIG 55 02/04/2017 1001   HgbA1c:  Lab Results  Component Value Date   HGBA1C 5.7 (H) 02/04/2017   Urine Drug Screen:     Component Value Date/Time   LABOPIA POSITIVE (A) 01/29/2017 0259   COCAINSCRNUR NONE DETECTED 01/29/2017 0259   LABBENZ POSITIVE (A) 01/29/2017 0259   AMPHETMU NONE DETECTED 01/29/2017 0259    THCU NONE DETECTED 01/29/2017 0259   LABBARB NONE DETECTED 01/29/2017 0259    Alcohol Level     Component Value Date/Time   ETH <10 02/04/2017 1001    IMAGING I have personally reviewed the radiological images below and agree with the radiology interpretations.  Ct Head Wo Contrast 02/07/2017 1. Unchanged large right parietal hematoma, surrounding edema, and midline shift. 2. Unchanged small hemorrhagic contusion in the anterior left frontal lobe. 3. Unchanged intraventricular blood with mild right temporal horn Dilatation.  02/04/2017 Stable large 7.3 x 5.8 x 7.4 cm intraparenchymal hemorrhage in the right parietal lobe  with intraventricular rupture.  Stable persistent 17 mm of right to left midline shift.  Persistent mild dilatation of the temporal horn of the right lateral ventricle without significant hydrocephalus.   02/04/2017 Large intracranial hemorrhage centered in the right parietal lobe.  1.7 cm of midline shift with intraventricular blood.  Small amount of hemorrhage in the left frontal lobe and a small amount of subdural blood.   TTE pending  CUS pending   PHYSICAL EXAM Vitals:   02/09/17 0726 02/09/17 0741 02/09/17 1126 02/09/17 1212  BP:  (!) 134/108 130/66   Pulse:  (!) 101 92   Resp:  14 15   Temp: 100.3 F (37.9 C)   (!) 100.5 F (38.1 C)  TempSrc:    Axillary  SpO2:  98% 99%   Weight:      Height:       General - Overweight, multiple bruises on RUE (R humerus fracture)  Mental Status -  Opens eyes to voice and remains open, on ventilator Follows limited commands on the right with right toe wiggling and showing thumb but did not show two fingers.  Cranial Nerves II - XII - II - Eyes open, flinches to bilateral visual threats III, IV, VI - PERRL, right gaze preference but able to cross midline, does not follow commands to track VII - Face symmetric ETT in place XII - tongue midline in mouth  Motor Strength - RUE strength proximal 0/5 due to  humerus farcture, distal minimal movement due to mental status, raises thumb and moves toes to command, No LUE movement, LLE withdraws to pain  Sensation, coordination and gait not tested.   ASSESSMENT/PLAN [name] is a 61 y.o. male with history of a previous stroke, hypertension, alcohol abuse, diabetes, COPD, and atrial fibrillation on Xarelto with recent falls presenting with increased confusion with increased work of breathing. He did not receive IV t-PA due to ICH.  ICH - large ICH right parietal, likely hypertension in the setting of anticoagulation  Resultant left hemi-plegia and right gaze preference  CT head - Large intracranial hemorrhage centered in the right parietal lobe. 17 mm of right to left midline shift.   Consider MRI and MRA to rule out aneurysm or AVM once more stable  Carotid Doppler - pending  2D Echo - pending  LDL - pending  HgbA1c - pending  VTE prophylaxis - heparin subq Diet NPO time specified  aspirin 81 mg daily and Xarelto (rivaroxaban) daily prior to admission, now on No antithrombotic.   Ongoing aggressive stroke risk factor management  Therapy recommendations: pending  Disposition: Pending  Afib  Was on Xarelto PTA  Currently on cardizem for rate control  Hold off AC due to ICH  Hypertension  BP goal <160 SBP   Stable   Long-term BP goal normotensive  Hyperlipidemia  Home meds: No lipid lowering medications prior to admission  LDL pending  Diabetes mellitus  Hemoglobin A1c - 5.7  Controlled  SSI  CBG monitoring  Cytotoxic Cerebral Edema  3% saline 02/06/2017 now on hold  Na 149  5 days out, will d/c hypertonic saline  Tobacco abuse  Current smoker  Smoking cessation counseling will be provided  Other Stroke Risk Factors  Advanced age  ETOH use, will be advised to limit alcohol use  Obesity, Body mass index is 31.35 kg/m., recommend weight loss, diet and exercise as appropriate   Hx  stroke/TIA  Other Active Problems  Sepsis - Zosyn started 02/04/17  Tube feedings  Intubated on ventilator - trach tomorrow  Right humerus fracture off splint  Hospital day # 5  This patient is critically ill due to large right ICH with cerebral edema, A. fib with RVR, chronic alcoholism and at significant risk of neurological worsening, death form cerebral edema, peripheral herniation, hematoma expansion, seizure, heart failure, alcohol withdraw. This patient's care requires constant monitoring of vital signs, hemodynamics, respiratory and cardiac monitoring, review of multiple databases, neurological assessment, discussion with family, other specialists and medical decision making of high complexity. I spent 35 minutes of neurocritical care time in the care of this patient.  Marvel Plan, MD PhD Stroke Neurology 02/09/2017 4:44 PM   To contact Stroke Continuity provider, please refer to [email or web address]. After hours, contact General Neurology

## 2017-02-09 NOTE — Progress Notes (Signed)
Per MD verbal order, do not wean patient today due to getting trach on 02/10/17 in the AM.

## 2017-02-10 ENCOUNTER — Inpatient Hospital Stay (HOSPITAL_COMMUNITY): Payer: Medicare Other

## 2017-02-10 DIAGNOSIS — J9601 Acute respiratory failure with hypoxia: Secondary | ICD-10-CM

## 2017-02-10 DIAGNOSIS — F1020 Alcohol dependence, uncomplicated: Secondary | ICD-10-CM

## 2017-02-10 LAB — BASIC METABOLIC PANEL
ANION GAP: 6 (ref 5–15)
BUN: 18 mg/dL (ref 6–20)
CHLORIDE: 112 mmol/L — AB (ref 101–111)
CO2: 30 mmol/L (ref 22–32)
Calcium: 8.5 mg/dL — ABNORMAL LOW (ref 8.9–10.3)
Creatinine, Ser: 0.73 mg/dL (ref 0.61–1.24)
GFR calc non Af Amer: >60 mL/min (ref >60)
GLUCOSE: 170 mg/dL — AB (ref 65–99)
POTASSIUM: 3.7 mmol/L (ref 3.5–5.1)
Sodium: 148 mmol/L — ABNORMAL HIGH (ref 135–145)

## 2017-02-10 LAB — GLUCOSE, CAPILLARY
GLUCOSE-CAPILLARY: 205 mg/dL — AB (ref 65–99)
Glucose-Capillary: 168 mg/dL — ABNORMAL HIGH (ref 65–99)
Glucose-Capillary: 138 mg/dL — ABNORMAL HIGH (ref 65–99)
Glucose-Capillary: 149 mg/dL — ABNORMAL HIGH (ref 65–99)
Glucose-Capillary: 167 mg/dL — ABNORMAL HIGH (ref 65–99)

## 2017-02-10 LAB — ECHOCARDIOGRAM COMPLETE
HEIGHTINCHES: 70 in
WEIGHTICAEL: 3266.34 [oz_av]

## 2017-02-10 LAB — LIPID PANEL
CHOL/HDL RATIO: 4.4 ratio
Cholesterol: 106 mg/dL (ref 0–200)
HDL: 24 mg/dL — AB (ref >40)
LDL CALC: 66 mg/dL (ref 0–99)
TRIGLYCERIDES: 80 mg/dL (ref <150)
VLDL: 16 mg/dL (ref 0–40)

## 2017-02-10 LAB — CBC
HEMATOCRIT: 32.6 % — AB (ref 39.0–52.0)
HEMOGLOBIN: 9.3 g/dL — AB (ref 13.0–17.0)
MCH: 26.3 pg (ref 26.0–34.0)
MCHC: 28.5 g/dL — ABNORMAL LOW (ref 30.0–36.0)
MCV: 92.1 fL (ref 78.0–100.0)
Platelets: 225 10*3/uL (ref 150–400)
RBC: 3.54 MIL/uL — AB (ref 4.22–5.81)
RDW: 16.6 % — ABNORMAL HIGH (ref 11.5–15.5)
WBC: 13.1 10*3/uL — ABNORMAL HIGH (ref 4.0–10.5)

## 2017-02-10 LAB — HEMOGLOBIN A1C
HEMOGLOBIN A1C: 5.9 % — AB (ref 4.8–5.6)
Mean Plasma Glucose: 122.63 mg/dL

## 2017-02-10 MED ORDER — CHLORHEXIDINE GLUCONATE CLOTH 2 % EX PADS
6.0000 | MEDICATED_PAD | Freq: Every day | CUTANEOUS | Status: DC
  Administered 2017-02-10 – 2017-02-13 (×4): 6 via TOPICAL

## 2017-02-10 MED ORDER — SODIUM CHLORIDE 0.9% FLUSH: 10.0000 mL | INTRAVENOUS | Status: DC | PRN

## 2017-02-10 MED ORDER — POTASSIUM CHLORIDE 20 MEQ/15ML (10%) PO SOLN
40.0000 meq | Freq: Once | ORAL | Status: AC
  Administered 2017-02-10: 40 meq via ORAL
  Filled 2017-02-10: qty 30

## 2017-02-10 MED ORDER — METOPROLOL TARTRATE 25 MG/10 ML ORAL SUSPENSION
25.0000 mg | Freq: Two times a day (BID) | ORAL | Status: DC
  Administered 2017-02-10 – 2017-02-11 (×3): 25 mg
  Filled 2017-02-10 (×3): qty 10

## 2017-02-10 MED ORDER — SODIUM CHLORIDE 0.9% FLUSH
10.0000 mL | Freq: Two times a day (BID) | INTRAVENOUS | Status: DC
  Administered 2017-02-10 – 2017-02-12 (×4): 10 mL
  Administered 2017-02-12: 30 mL
  Administered 2017-02-13 – 2017-02-14 (×3): 10 mL

## 2017-02-10 NOTE — Progress Notes (Signed)
VASCULAR LAB PRELIMINARY  PRELIMINARY  PRELIMINARY  PRELIMINARY  Carotid duplex completed.    Preliminary report:  Right - 1% to 39% ICA stenosis. Vertebral artery flow is antegrade. Left - 40% to 59% ICA stenosis. Unable to insonate the vertebral artery due to tracheostomy stabilizer  Leolia Vinzant, RVS 02/10/2017, 1:36 PM

## 2017-02-10 NOTE — Progress Notes (Signed)
PULMONARY / CRITICAL CARE MEDICINE   Name: Stuart Mendoza MRN: [id number] DOB: 10-06-1955    ADMISSION DATE:  02/04/2017  REFERRING MD:  Dr. Sheria Lang, ER  CHIEF COMPLAINT:  Altered mental status  HISTORY OF PRESENT ILLNESS:   61 yo male smoker brought to ER with altered mental status.  Found to have larged Rt parietal ICH with IVH and 1.7 cm midline shift with hydrocephalus.  Has hx of a fib on xarelto.  Intubated for airway, and given Kcentra.  Had recent admission (9/29 to 10/04) for alcohol withdrawal.  PMHx COPD, DM, HTN.  SUBJECTIVE:  Mild V t self resolving day prior Remains on vent Follows commands on rt  VITAL SIGNS: BP 137/81   Pulse (!) 105   Temp (!) 100.9 F (38.3 C) (Axillary)   Resp 14   Ht  (1.778 m)   Wt 92.6 kg (204 lb 2.3 oz)   SpO2 100%   BMI 29.29 kg/m   VENTILATOR SETTINGS: Vent Mode: PRVC FiO2 (%):  [30 %] 30 % Set Rate:  [14 bmp] 14 bmp Vt Set:  [620 mL] 620 mL PEEP:  [5 cmH20] 5 cmH20 Plateau Pressure:  [14 cmH20-15 cmH20] 14 cmH20  INTAKE / OUTPUT: I/O last 3 completed shifts: In: 2522.3 [I.V.:422.3; NG/GT:2100] Out: 3675 [Urine:3675]  PHYSICAL EXAMINATION:  General: awakens, and follow commands Neuro: rt side movement , fc, no other changes HEENT: jvd wnl , ett PULM: CTA good entry apical CV: s1 s2 RR slight tachy 109 GI: soft, obese, nt, bd, o r Extremities: edema mild     LABS:  BMET  Recent Labs Lab 02/08/17 0339  02/09/17 0429 02/09/17 1322 02/10/17 0504  NA 157*  < > 149* 149* 148*  K 3.4*  --  3.3*  --  3.7  CL 120*  --  113*  --  112*  CO2 30  --  31  --  30  BUN 17  --  16  --  18  CREATININE 0.90  --  0.78  --  0.73  GLUCOSE 227*  --  218*  --  170*  < > = values in this interval not displayed.  Electrolytes  Recent Labs Lab 02/05/17 1820  02/06/17 0410 02/06/17 1824  02/08/17 0339 02/09/17 0429 02/10/17 0504  CALCIUM  --   < > 8.8*  --   < > 8.6* 8.5* 8.5*  MG 2.3  --  2.3 2.3  --   --    --   --   PHOS 1.6*  --  2.2* 2.8  --   --   --   --   < > = values in this interval not displayed.  CBC  Recent Labs Lab 02/08/17 0339 02/09/17 0429 02/10/17 0504  WBC 11.4* 14.5* 13.1*  HGB 8.7* 9.2* 9.3*  HCT 31.1* 32.6* 32.6*  PLT 214 204 225    Coag's  Recent Labs Lab 02/05/17 0107 02/05/17 0641 02/06/17 0410  INR 1.23 1.12 1.23    Sepsis Markers  Recent Labs Lab 02/04/17 1021  LATICACIDVEN 1.94*    ABG  Recent Labs Lab 02/04/17 1045 02/04/17 1637 02/05/17 0405  PHART 7.502* 7.484* 7.479*  PCO2ART 36.4 32.6 33.8  PO2ART 236.0* 81.0* 127*    Liver Enzymes  Recent Labs Lab 02/04/17 1001  AST 21  ALT 18  ALKPHOS 99  BILITOT 2.4*  ALBUMIN 3.7    Cardiac Enzymes No results for input(s): TROPONINI, PROBNP in the last 168 hours.  Glucose  Recent Labs Lab 02/09/17 1215 02/09/17 1627 02/09/17 1929 02/09/17 2314 02/10/17 0309 02/10/17 0754  GLUCAP 263* 176* 182* 162* 168* 138*    Imaging Dg Chest Port 1 View  Result Date: 02/10/2017 CLINICAL DATA:  Respiratory difficulty EXAM: PORTABLE CHEST 1 VIEW COMPARISON:  02/08/2017 FINDINGS: Endotracheal and NG tubes stable. Left subclavian central venous catheter stable. Moderate cardiomegaly stable. Diffuse edema improved. Bibasilar haziness may represent small effusions and this is stable. Plates and screws on left ribs unchanged. No pneumothorax. IMPRESSION: Improving edema.  Stable support apparatus. Electronically Signed   By: Jolaine Click M.D.   On: 02/10/2017 06:50     STUDIES:  CT head 10/6 >> Large ICH Rt parietal lobe.  1.7 cm of midline shift.  Small amount of hemorrhage in the Lt frontal lobe and a small amount of subdural blood. CT head 10/9>>>unchanged  CULTURES: Blood 10/6 >> 1/2 Coag neg staph Urine 10/6 >> negative Sputum 10/6 >>  ANTIBIOTICS: Vancomycin 10/6 >>10/8 Zosyn 10/6 >> 10/7 CTX 10/8>>>end date in place 10/14  SIGNIFICANT EVENTS: 10/6 Admit, neurology,  neurosurgery consulted, kcentra, 3% NS 10/12 remains with reasonable neurostatus  LINES/TUBES: ETT 10/06 >>   ASSESSMENT / PLAN:  NEURO ICH, IVH, vent dyschrony - WUA -HOB elevation -correct na  CVS Hypertension. A fib with RVR Someectopy 10/11 - goal SBP < 140 per neurology, consider further normalization this far out -dilt oral 30 q6h, would have increased more, but with some ectopy would prefer add BB  PULM Acute respiratory failure with compromised airway Aspiration pneumonia. Hx of COPD. Collapse rt lung, imrpoved -pcxr I reviewed shows mild ATX Will compete course abx Consider trach today Wean cpap 5 ps 5 as able, upright  Hx of ETOH. - thiamine, folic acid, MVI  DM type II. - SSI  renal Hypokalemia. Induced hypernatremia - dc free water -allow na in range -correct k with some ectopy in past day  Anemia of critical illness and chronic disease. -limit phlebotomy as able coags reviewed, okay for trach  Aspiration PNA -CTX for total goal  (10/14 stop date)  GI Feeding held ppi for sup  Ccm time 35 min   Mcarthur Rossetti. Tyson Alias, MD, FACP Pgr: [phone] [place] [facility] 02/10/2017 8:36 AM

## 2017-02-10 NOTE — Progress Notes (Signed)
STROKE TEAM PROGRESS NOTE   SUBJECTIVE (INTERVAL HISTORY) No family members present. Pt had trach this am, tolerating well. No neuro changes, still follows limited commands, open eyes on voice. BP and HR stable. Na 148.    OBJECTIVE Temp:  [98.2 F (36.8 C)-100.9 F (38.3 C)] 100.9 F (38.3 C) (10/12 1154) Pulse Rate:  [25-117] 113 (10/12 1600) Cardiac Rhythm: Atrial fibrillation (10/12 0800) Resp:  [12-20] 16 (10/12 1600) BP: (83-187)/(60-103) 142/80 (10/12 1600) SpO2:  [97 %-100 %] 100 % (10/12 1600) FiO2 (%):  [30 %-100 %] 40 % (10/12 1556) Weight:  [204 lb 2.3 oz (92.6 kg)] 204 lb 2.3 oz (92.6 kg) (10/12 0500)  CBC:   Recent Labs Lab 02/04/17 1001  02/09/17 0429 02/10/17 0504  WBC 16.2*  < > 14.5* 13.1*  NEUTROABS 14.3*  --   --   --   HGB 11.2*  < > 9.2* 9.3*  HCT 35.0*  < > 32.6* 32.6*  MCV 85.6  < > 93.7 92.1  PLT 321  < > 204 225  < > = values in this interval not displayed.  Basic Metabolic Panel:   Recent Labs Lab 02/06/17 0410  02/06/17 1824  02/09/17 0429 02/09/17 1322 02/10/17 0504  NA 153*  < > 160*  < > 149* 149* 148*  K 3.3*  --   --   < > 3.3*  --  3.7  CL 122*  --   --   < > 113*  --  112*  CO2 26  --   --   < > 31  --  30  GLUCOSE 171*  --   --   < > 218*  --  170*  BUN 17  --   --   < > 16  --  18  CREATININE 0.69  --   --   < > 0.78  --  0.73  CALCIUM 8.8*  --   --   < > 8.5*  --  8.5*  MG 2.3  --  2.3  --   --   --   --   PHOS 2.2*  --  2.8  --   --   --   --   < > = values in this interval not displayed.  Lipid Panel:     Component Value Date/Time   CHOL 106 02/10/2017 0504   TRIG 80 02/10/2017 0504   HDL 24 (L) 02/10/2017 0504   CHOLHDL 4.4 02/10/2017 0504   VLDL 16 02/10/2017 0504   LDLCALC 66 02/10/2017 0504   HgbA1c:  Lab Results  Component Value Date   HGBA1C 5.9 (H) 02/10/2017   Urine Drug Screen:     Component Value Date/Time   LABOPIA POSITIVE (A) 01/29/2017 0259   COCAINSCRNUR NONE DETECTED 01/29/2017 0259   LABBENZ POSITIVE (A) 01/29/2017 0259   AMPHETMU NONE DETECTED 01/29/2017 0259   THCU NONE DETECTED 01/29/2017 0259   LABBARB NONE DETECTED 01/29/2017 0259    Alcohol Level     Component Value Date/Time   ETH <10 02/04/2017 1001    IMAGING I have personally reviewed the radiological images below and agree with the radiology interpretations.  Ct Head Wo Contrast 02/07/2017 1. Unchanged large right parietal hematoma, surrounding edema, and midline shift. 2. Unchanged small hemorrhagic contusion in the anterior left frontal lobe. 3. Unchanged intraventricular blood with mild right temporal horn Dilatation.  02/04/2017 Stable large 7.3 x 5.8 x 7.4 cm intraparenchymal hemorrhage in the right parietal lobe  with intraventricular rupture.  Stable persistent 17 mm of right to left midline shift.  Persistent mild dilatation of the temporal horn of the right lateral ventricle without significant hydrocephalus.   02/04/2017 Large intracranial hemorrhage centered in the right parietal lobe.  1.7 cm of midline shift with intraventricular blood.  Small amount of hemorrhage in the left frontal lobe and a small amount of subdural blood.   TTE - Left ventricle: The cavity size was normal. There was moderate   concentric hypertrophy. Systolic function was normal. The   estimated ejection fraction was in the range of 55% to 60%. Wall   motion was normal; there were no regional wall motion   abnormalities. - Mitral valve: There was poorly evaluated regurgitation, probably   mild. - Left atrium: The atrium was moderately to severely dilated. - Right ventricle: The cavity size was mildly dilated. - Right atrium: The atrium was mildly to moderately dilated.  CUS - Right - 1% to 39% ICA stenosis. Vertebral artery flow is antegrade. Left - 40% to 59% ICA stenosis. Unable to insonate the vertebral artery due to tracheostomy stabilizer   PHYSICAL EXAM Vitals:   02/10/17 1406 02/10/17 1500 02/10/17  1556 02/10/17 1600  BP: 139/82 135/84 120/83 (!) 142/80  Pulse: 95 (!) 112 (!) 110 (!) 113  Resp: Temp:      TempSrc:      SpO2: 100% 100% 100% 100%  Weight:      Height:       General - Overweight, multiple bruises on RUE (R humerus fracture)  Mental Status -  Opens eyes to voice and remains open, on trach with ventilator Follows limited commands on the right with right toe wiggling and showing thumb but did not show two fingers.  Cranial Nerves II - XII - II - Eyes open, flinches to bilateral visual threats III, IV, VI - PERRL, eye middle position, does not follow commands to track VII - left facial droop  XII - tongue midline in mouth  Motor Strength - RUE strength proximal 0/5 due to humerus farcture, right UE and LE distal movement with raising thumb and moves toes to command, No LUE movement, LLE withdraws to pain  Sensation, coordination and gait not tested.   ASSESSMENT/PLAN Stuart Mendoza is a 61 y.o. male with history of a previous stroke, hypertension, alcohol abuse, diabetes, COPD, and atrial fibrillation on Xarelto with recent falls presenting with increased confusion with increased work of breathing. He did not receive IV t-PA due to ICH.  ICH - large ICH right parietal, likely hypertension in the setting of anticoagulation  Resultant left hemi-plegia and right gaze preference  CT head - Large intracranial hemorrhage centered in the right parietal lobe. 17 mm of right to left midline shift.   Consider MRI and MRA to rule out aneurysm or AVM once more stable  Carotid Doppler - left 40-59% stenosis  2D Echo - LA dilated, EF 55-60%  LDL - 66  HgbA1c - 5.9  VTE prophylaxis - heparin subq Diet NPO time specified  aspirin 81 mg daily and Xarelto (rivaroxaban) daily prior to admission, now on No antithrombotic.   Ongoing aggressive stroke risk factor management  Therapy recommendations: pending  Disposition: Pending  Afib  Was on  Xarelto PTA  Currently on cardizem for rate control  Hold off AC due to ICH  Respiratory failure  Trach today  On vent  CCM following  Hypertension  BP goal <160 SBP  Stable   Long-term BP goal normotensive  Diabetes mellitus  Hemoglobin A1c - 5.9  Controlled  SSI  CBG monitoring  Cytotoxic Cerebral Edema  3% saline 02/06/2017 now off  Na 149->148  Tobacco abuse  Current smoker  Smoking cessation counseling will be provided  Other Stroke Risk Factors  Advanced age  ETOH use, will be advised to limit alcohol use - on FA/MVI/B1  Hx stroke/TIA  Other Active Problems  Sepsis - Zosyn -> rocephin  Tube feeding   Right humerus fracture off splint  Hospital day # 6  This patient is critically ill due to large right ICH with cerebral edema, A. fib with RVR, chronic alcoholism and at significant risk of neurological worsening, death form cerebral edema, peripheral herniation, hematoma expansion, seizure, heart failure, alcohol withdraw. This patient's care requires constant monitoring of vital signs, hemodynamics, respiratory and cardiac monitoring, review of multiple databases, neurological assessment, discussion with family, other specialists and medical decision making of high complexity. I spent 35 minutes of neurocritical care time in the care of this patient.  Marvel Plan, MD PhD Stroke Neurology 02/10/2017 4:15 PM   To contact Stroke Continuity provider, please refer to [email or web address]. After hours, contact General Neurology

## 2017-02-10 NOTE — Progress Notes (Signed)
Cortrak Tube Team Note:  Consult received to place a Cortrak feeding tube.   A 10 F Cortrak tube was placed in the left nare and secured with a nasal bridle at 89 cm. Per the Cortrak monitor reading the tube tip is post-pyloric.   No x-ray is required. RN may begin using tube.   If the tube becomes dislodged please keep the tube and contact the Cortrak team at [email or web address] ([email or web address]) for replacement.  If after hours and replacement cannot be delayed, place a NG tube and confirm placement with an abdominal x-ray.    Romelle Starcher MS, RD, LDN [phone] Pager  [phone] Weekend/On-Call Pager

## 2017-02-10 NOTE — Procedures (Signed)
Name:  Dayshaun Whobrey MRN:  [id number] DOB:  1955/06/30  OPERATIVE NOTE  Procedure:  Percutaneous tracheostomy.  Indications:  Ventilator-dependent respiratory failure.  Consent:  Procedure, alternatives, risks and benefits discussed with medical POA.  Questions answered.  Consent obtained.  Anesthesia:  Prop, vedc, versed, fent, atomidate x 1  Procedure summary:  Appropriate equipment was assembled.  The patient was identified as Stuart Mendoza and safety timeout was performed. The patient was placed in supine position with a towel roll behind shoulder blades and neck extended.  Sterile technique was used. The patient's neck and upper chest were prepped using chlorhexidine / alcohol scrub and the field was draped in usual sterile fashion with full body drape. After the adequate sedation / anesthesia was achieved, attention was directed at the midline trachea, where the cricothyroid membrane was palpated. Approximately two fingerbreadths above the sternal notch, a verticle incision was created with a scalpel after local infiltration with 0.2% Lidocaine. Then, using Seldinger technique and a percutaneous tracheostomy set, the trachea was entered with a 14 gauge needle with an overlying sheath. This was all confirmed under direct visualization of a fiberoptic flexible bronchoscope. Entrance into the trachea was identified through the third tracheal ring interspace. Following this, a guidewire was inserted. The needle was removed, leaving the sheath and the guidewire intact. Next, the sheath was removed and a small dilator was inserted. The tracheal rings were then dilated. A #6 Shiley was then opened. The balloon was checked. It was placed over a tracheal dilator, which was then advanced over the guidewire and through the previously dilated tract. The Shiley tracheostomy tube was noted to pass in the trachea with little resistance. The guidewire and dilator tubes were removed from the trachea. An  inner cannula was placed through the tracheostomy tube. The tracheostomy was then secured at the anterior neck with 4 monofilament sutures. The oral endotracheal tube was removed and the ventilator was attached to the newly placed tracheostomy tube. Adequate tidal volumes were noted. The cuff was inflated and no evidence of air leak was noted. No evidence of bleeding was noted. At this point, the procedure was concluded. Post-procedure chest x-ray was ordered.  Complications:  No immediate complications were noted.  Hemodynamic parameters and oxygenation remained stable throughout the procedure.  Estimated blood loss:  Less then 1 mL.  Nelda Bucks., MD Pulmonary and Critical Care Medicine [facility] Pager: [phone]  02/10/2017, 9:52 AM  Should follow up in [facility] trach clinic [phone]

## 2017-02-10 NOTE — Progress Notes (Signed)
Echocardiogram 2D Echocardiogram has been performed.  Stuart Mendoza 02/10/2017, 2:34 PM

## 2017-02-10 NOTE — Care Management Note (Signed)
Case Management Note Original note, by:  Elliot Cousin, RN 02/06/2017 3:40pm  Patient Details  Name: Stuart Mendoza MRN: [id number] Date of Birth: [birth year]  Subjective/Objective:    Large ICH, HTN, DM          Action/Plan: Discharge Planning: Chart reviewed. Pt is from [facility]. Pt's contact Dtr, [name] # [phone] or Brother, [name]. Will continue to follow SNF vs Palliative. CSW following for SNF.    Expected Discharge Date:                 Expected Discharge Plan:  Skilled Nursing Facility  In-House Referral:  Clinical Social Work  Discharge planning Services  CM Consult  Post Acute Care Choice:  NA Choice offered to:  NA   Status of Service:  In process, will continue to follow  If discussed at Long Length of Stay Meetings, dates discussed:    Additional Comments:  [name], RN, BSN Pt s/p tracheostomy insertion this AM.  Will continue to follow progress.     [name], RN 02/10/2017, 2:03 PM

## 2017-02-10 NOTE — Procedures (Signed)
Bedside Tracheostomy Insertion Procedure Note   Patient Details:   Name: Stuart Mendoza DOB: 07/19/1955 MRN: [id number]  Procedure: Tracheostomy  Pre Procedure Assessment: ET Tube Size: 7.5 ET Tube secured at lip (cm): 26 Bite block in place: No Breath Sounds: Clear, Diminished and Rhonch  Post Procedure Assessment: BP (!) 143/81   Pulse 97   Temp (!) 100.9 F (38.3 C) (Axillary)   Resp 14   Ht  (1.778 m)   Wt 204 lb 2.3 oz (92.6 kg)   SpO2 98%   BMI 29.29 kg/m  O2 sats: stable throughout Complications: No apparent complications Patient did tolerate procedure well Tracheostomy Brand:Shiley Tracheostomy Style:Cuffed Tracheostomy Size: 6 Tracheostomy Secured WUJ:[id number], Velcro Tracheostomy Placement Confirmation:Trach cuff visualized and in place    [employer] 02/10/2017, 10:02 AM

## 2017-02-10 NOTE — Procedures (Signed)
PCCM Video Bronchoscopy Procedure Note  The patient was informed of the risks (including but not limited to bleeding, infection, respiratory failure, lung injury, tooth/oral injury) and benefits of the procedure and gave consent, see chart.  Indication: visualization for tracheostomy  Post Procedure Diagnosis: acute respiratory failure with hypoxemia  Location: [place] [facility]  Condition pre procedure: critically ill on vent  Medications for procedure: vecuronium, fentanyl, versed per ICU infusions and injected for the procedure  Procedure description: The bronchoscope was introduced through the endotracheal tube and passed to the bilateral lungs to the level of the subsegmental bronchi throughout the tracheobronchial tree.  Airway exam revealed normal bronchial anatomy, no airway lesions.  The scope was used to facilitate visualization for the procedure.  Procedures performed: none  Specimens sent: none  Condition post procedure: critically ill on vent  EBL: none from bronchoscopy  Complications: none immediate  Heber Renovo, MD [place] Pager: [phone] Cell: [phone] After 3pm or if no response, call [phone]

## 2017-02-11 DIAGNOSIS — J9601 Acute respiratory failure with hypoxia: Secondary | ICD-10-CM

## 2017-02-11 LAB — GLUCOSE, CAPILLARY
GLUCOSE-CAPILLARY: 213 mg/dL — AB (ref 65–99)
GLUCOSE-CAPILLARY: 183 mg/dL — AB (ref 65–99)
GLUCOSE-CAPILLARY: 199 mg/dL — AB (ref 65–99)
Glucose-Capillary: 189 mg/dL — ABNORMAL HIGH (ref 65–99)
Glucose-Capillary: 222 mg/dL — ABNORMAL HIGH (ref 65–99)
Glucose-Capillary: 204 mg/dL — ABNORMAL HIGH (ref 65–99)

## 2017-02-11 LAB — BASIC METABOLIC PANEL
Anion gap: 6 (ref 5–15)
BUN: 17 mg/dL (ref 6–20)
CHLORIDE: 112 mmol/L — AB (ref 101–111)
CO2: 29 mmol/L (ref 22–32)
CREATININE: 0.78 mg/dL (ref 0.61–1.24)
Calcium: 8.5 mg/dL — ABNORMAL LOW (ref 8.9–10.3)
GFR calc Af Amer: >60 mL/min (ref >60)
GFR calc non Af Amer: >60 mL/min (ref >60)
Glucose, Bld: 184 mg/dL — ABNORMAL HIGH (ref 65–99)
Potassium: 3.7 mmol/L (ref 3.5–5.1)
SODIUM: 147 mmol/L — AB (ref 135–145)

## 2017-02-11 MED ORDER — AMLODIPINE 1 MG/ML ORAL SUSPENSION: 10.0000 mg | Freq: Every day | ORAL | Status: DC

## 2017-02-11 MED ORDER — BISACODYL 10 MG RE SUPP
10.0000 mg | Freq: Every day | RECTAL | Status: DC | PRN
  Administered 2017-02-11: 10 mg via RECTAL
  Filled 2017-02-11: qty 1

## 2017-02-11 MED ORDER — LISINOPRIL 10 MG PO TABS
10.0000 mg | ORAL_TABLET | Freq: Every day | ORAL | Status: DC
  Administered 2017-02-11: 10 mg
  Filled 2017-02-11 (×2): qty 1

## 2017-02-11 MED ORDER — DOCUSATE SODIUM 50 MG/5ML PO LIQD
100.0000 mg | Freq: Every day | ORAL | Status: DC
  Administered 2017-02-11 – 2017-03-02 (×8): 100 mg
  Filled 2017-02-11 (×12): qty 10

## 2017-02-11 MED ORDER — METOPROLOL TARTRATE 25 MG/10 ML ORAL SUSPENSION
25.0000 mg | Freq: Once | ORAL | Status: AC
  Administered 2017-02-11: 25 mg via ORAL
  Filled 2017-02-11: qty 10

## 2017-02-11 MED ORDER — METOPROLOL TARTRATE 25 MG/10 ML ORAL SUSPENSION
50.0000 mg | Freq: Two times a day (BID) | ORAL | Status: DC
  Administered 2017-02-11 – 2017-03-02 (×37): 50 mg
  Filled 2017-02-11 (×38): qty 20

## 2017-02-11 NOTE — Progress Notes (Signed)
Placed pt back on ventilator for the night to rest. RT will continue to monitor pt.

## 2017-02-11 NOTE — Progress Notes (Signed)
Notified Dr. Roda Shutters of elevated BP. Pt has been given prn BP med and still greater than ordered parameters. Orders changed- increased scheduled PO BP medication. Will give now and cont to monitor.

## 2017-02-11 NOTE — Progress Notes (Signed)
PULMONARY / CRITICAL CARE MEDICINE   Name: Xylon Croom MRN: [id number] DOB: 08-May-1955    ADMISSION DATE:  02/04/2017  REFERRING MD:  Dr. Sheria Lang, ER  CHIEF COMPLAINT:  Altered mental status  HISTORY OF PRESENT ILLNESS:   61 yo male smoker brought to ER with altered mental status.  Found to have large Rt parietal ICH with IVH and 1.7 cm midline shift with hydrocephalus.  Has hx of a fib on xarelto.  Intubated for airway, and given Kcentra.  Had recent admission (9/29 to 10/04) for alcohol withdrawal.  PMHx COPD, DM, HTN.  SUBJECTIVE:  S/p trach placement 10/12 Note cuff leak, poor return volumes  VITAL SIGNS: BP (!) 151/87 (BP Location: Left Arm)   Pulse (!) 105   Temp (!) 101.3 F (38.5 C) (Oral)   Resp 16   Ht  (1.778 m)   Wt 98.2 kg (216 lb 7.9 oz)   SpO2 99%   BMI 31.06 kg/m   VENTILATOR SETTINGS: Vent Mode: PRVC FiO2 (%):  [40 %-100 %] 40 % Set Rate:  [14 bmp-20 bmp] 14 bmp Vt Set:  [620 mL] 620 mL PEEP:  [5 cmH20] 5 cmH20 Plateau Pressure:  [13 cmH20-15 cmH20] 15 cmH20  INTAKE / OUTPUT: I/O last 3 completed shifts: In: 2197.1 [I.V.:287.1; NG/GT:1810; IV Piggyback:100] Out: 3250 [Urine:3250]  PHYSICAL EXAMINATION:  General: obtunded on fentanyl 75, will not wake to voice Neuro: per RN has followed commands on the right, will not do so for me this morning HEENT: trach in good position, cuff balloon appears to be holding air, large cuff leak through his mouth and nose PULM: clear bilaterally, no whe3eze CV: regular, tachycardia GI: soft, obese, positive bowel sounds Extremities: edema mild     LABS:  BMET  Recent Labs Lab 02/09/17 0429 02/09/17 1322 02/10/17 0504 02/11/17 0650  NA 149* 149* 148* 147*  K 3.3*  --  3.7 3.7  CL 113*  --  112* 112*  CO2 31  --  30 29  BUN 16  --  18 17  CREATININE 0.78  --  0.73 0.78  GLUCOSE 218*  --  170* 184*    Electrolytes  Recent Labs Lab 02/05/17 1820  02/06/17 0410 02/06/17 1824   02/09/17 0429 02/10/17 0504 02/11/17 0650  CALCIUM  --   < > 8.8*  --   < > 8.5* 8.5* 8.5*  MG 2.3  --  2.3 2.3  --   --   --   --   PHOS 1.6*  --  2.2* 2.8  --   --   --   --   < > = values in this interval not displayed.  CBC  Recent Labs Lab 02/08/17 0339 02/09/17 0429 02/10/17 0504  WBC 11.4* 14.5* 13.1*  HGB 8.7* 9.2* 9.3*  HCT 31.1* 32.6* 32.6*  PLT 214 204 225    Coag's  Recent Labs Lab 02/05/17 0107 02/05/17 0641 02/06/17 0410  INR 1.23 1.12 1.23    Sepsis Markers  Recent Labs Lab 02/04/17 1021  LATICACIDVEN 1.94*    ABG  Recent Labs Lab 02/04/17 1045 02/04/17 1637 02/05/17 0405  PHART 7.502* 7.484* 7.479*  PCO2ART 36.4 32.6 33.8  PO2ART 236.0* 81.0* 127*    Liver Enzymes  Recent Labs Lab 02/04/17 1001  AST 21  ALT 18  ALKPHOS 99  BILITOT 2.4*  ALBUMIN 3.7    Cardiac Enzymes No results for input(s): TROPONINI, PROBNP in the last 168 hours.  Glucose  Recent  Labs Lab 02/10/17 0754 02/10/17 1152 02/10/17 1621 02/10/17 1942 02/11/17 0351 02/11/17 0744  GLUCAP 138* 149* 205* 167* 213* 189*    Imaging Dg Chest Port 1 View  Result Date: 02/10/2017 CLINICAL DATA:  Acute respiratory acidosis. EXAM: PORTABLE CHEST 1 VIEW COMPARISON:  Radiograph of same day. FINDINGS: Stable cardiomegaly. There is been interval removal of endotracheal and nasogastric tubes. Interval placement of tracheostomy tube which is in grossly good position. Left subclavian catheter is noted with distal tip in expected position of the SVC. No pneumothorax is noted. No pleural effusion is noted. Minimal bibasilar subsegmental atelectasis is noted. Old left clavicular fracture is noted. IMPRESSION: Interval placement of tracheostomy tube which is in grossly good position. Minimal bibasilar subsegmental atelectasis is noted. Electronically Signed   By: Lupita Raider, M.D.   On: 02/10/2017 10:17     STUDIES:  CT head 10/6 >> Large ICH Rt parietal lobe.  1.7 cm  of midline shift.  Small amount of hemorrhage in the Lt frontal lobe and a small amount of subdural blood. CT head 10/9>>>unchanged  CULTURES: Blood 10/6 >> 1/2 Coag neg staph, likely MRSE based on BC ID Urine 10/6 >> negative Sputum 10/6 >>  ANTIBIOTICS: Vancomycin 10/6 >>10/8 Zosyn 10/6 >> 10/7 CTX 10/8>>>end date in place 10/14  SIGNIFICANT EVENTS: 10/6 Admit, neurology, neurosurgery consulted, kcentra, 3% NS 10/12 remains with reasonable neurostatus  LINES/TUBES: ETT 10/06 >> 10/12 Trach #6 Shiley (DF) 10/12 >>   ASSESSMENT / PLAN:  NEURO ICH, IVH, vent dyschrony Continue daily wakeup assessments Attempt to minimize fentanyl now that he is no longer intubated Allow sodium to continue to correct  CVS Hypertension. A fib with RVR Some ectopy 10/11, none currently Continue diltiazem scheduled, metoprolol scheduled Add home lisinopril (half dose) 10/13, goal SBP less than 140  PULM Acute respiratory failure with compromised airway Status post tracheostomy, note cuff leak. Cuff balloon appears to be holding air. Question whether he may require an XLT if he continues to require ventilation.Hopefully can work to trach collar Aspiration pneumonia. Hx of COPD. Collapse rt lung, imrpoved Complete course ceftriaxone, stop date and place Work towards ATC ad lib. 110/13  Hx of ETOH. Thiamine, full acid, multivitamin  DM type II. Sliding scale insulin per protocol  renal Hypokalemia. Induced hypernatremia Free water held, allow sodium to slowly correct replace electrolytes as indicated Follow BMP, urine output  Anemia of critical illness and chronic disease. Follow CBC  Aspiration PNA Complete course ceftriaxone (10/14 stop date)  GI Tube feeding PPI  Independent CC time 32 minutes  Levy Pupa, MD, PhD 02/11/2017, 9:13 AM [facility] [phone] or if no answer [phone]

## 2017-02-11 NOTE — Progress Notes (Signed)
Per MD verbal request RT placed patient on ATC 8L 40%, patient is tolerating ATC. No apparent distress or complications. RT will continue to monitor.

## 2017-02-11 NOTE — Progress Notes (Signed)
STROKE TEAM PROGRESS NOTE   SUBJECTIVE (INTERVAL HISTORY) No family members present. Pt had trach yesterday, tolerating well, now on trach collar. Had fever overnight, and BP high, received PRN meds. Lethargic today but with prompting he still follows limited commands, open eyes on voice, moving thumb and wiggle toes. .    OBJECTIVE Temp:  [98.5 F (36.9 C)-101.3 F (38.5 C)] 99.2 F (37.3 C) (10/13 1200) Pulse Rate:  [81-122] 109 (10/13 1511) Cardiac Rhythm: Atrial fibrillation (10/13 0800) Resp:  [9-30] 18 (10/13 1511) BP: (120-189)/(74-103) 173/100 (10/13 1511) SpO2:  [97 %-100 %] 99 % (10/13 1511) FiO2 (%):  [40 %] 40 % (10/13 1511) Weight:  [216 lb 7.9 oz (98.2 kg)] 216 lb 7.9 oz (98.2 kg) (10/13 0500)  CBC:   Recent Labs Lab 02/09/17 0429 02/10/17 0504  WBC 14.5* 13.1*  HGB 9.2* 9.3*  HCT 32.6* 32.6*  MCV 93.7 92.1  PLT 204 225    Basic Metabolic Panel:   Recent Labs Lab 02/06/17 0410  02/06/17 1824  02/10/17 0504 02/11/17 0650  NA 153*  < > 160*  < > 148* 147*  K 3.3*  --   --   < > 3.7 3.7  CL 122*  --   --   < > 112* 112*  CO2 26  --   --   < > 30 29  GLUCOSE 171*  --   --   < > 170* 184*  BUN 17  --   --   < > 18 17  CREATININE 0.69  --   --   < > 0.73 0.78  CALCIUM 8.8*  --   --   < > 8.5* 8.5*  MG 2.3  --  2.3  --   --   --   PHOS 2.2*  --  2.8  --   --   --   < > = values in this interval not displayed.  Lipid Panel:     Component Value Date/Time   CHOL 106 02/10/2017 0504   TRIG 80 02/10/2017 0504   HDL 24 (L) 02/10/2017 0504   CHOLHDL 4.4 02/10/2017 0504   VLDL 16 02/10/2017 0504   LDLCALC 66 02/10/2017 0504   HgbA1c:  Lab Results  Component Value Date   HGBA1C 5.9 (H) 02/10/2017   Urine Drug Screen:     Component Value Date/Time   LABOPIA POSITIVE (A) 01/29/2017 0259   COCAINSCRNUR NONE DETECTED 01/29/2017 0259   LABBENZ POSITIVE (A) 01/29/2017 0259   AMPHETMU NONE DETECTED 01/29/2017 0259   THCU NONE DETECTED 01/29/2017 0259    LABBARB NONE DETECTED 01/29/2017 0259    Alcohol Level     Component Value Date/Time   ETH <10 02/04/2017 1001    IMAGING I have personally reviewed the radiological images below and agree with the radiology interpretations.  Ct Head Wo Contrast 02/07/2017 1. Unchanged large right parietal hematoma, surrounding edema, and midline shift. 2. Unchanged small hemorrhagic contusion in the anterior left frontal lobe. 3. Unchanged intraventricular blood with mild right temporal horn Dilatation.  02/04/2017 Stable large 7.3 x 5.8 x 7.4 cm intraparenchymal hemorrhage in the right parietal lobe with intraventricular rupture.  Stable persistent 17 mm of right to left midline shift.  Persistent mild dilatation of the temporal horn of the right lateral ventricle without significant hydrocephalus.   02/04/2017 Large intracranial hemorrhage centered in the right parietal lobe.  1.7 cm of midline shift with intraventricular blood.  Small amount of hemorrhage in the  left frontal lobe and a small amount of subdural blood.   TTE - Left ventricle: The cavity size was normal. There was moderate   concentric hypertrophy. Systolic function was normal. The   estimated ejection fraction was in the range of 55% to 60%. Wall   motion was normal; there were no regional wall motion   abnormalities. - Mitral valve: There was poorly evaluated regurgitation, probably   mild. - Left atrium: The atrium was moderately to severely dilated. - Right ventricle: The cavity size was mildly dilated. - Right atrium: The atrium was mildly to moderately dilated.  CUS - Right - 1% to 39% ICA stenosis. Vertebral artery flow is antegrade. Left - 40% to 59% ICA stenosis. Unable to insonate the vertebral artery due to tracheostomy stabilizer   PHYSICAL EXAM Vitals:   02/11/17 1200 02/11/17 1300 02/11/17 1359 02/11/17 1511  BP: (!) 175/99 (!) 166/93  (!) 173/100  Pulse: (!) 108 (!) 109  (!) 109  Resp: (!) 24   18   Temp: 99.2 F (37.3 C)     TempSrc: Oral     SpO2: 100% 99% 99% 99%  Weight:      Height:       General - Overweight, multiple bruises on RUE (R humerus fracture)  Mental Status -  Opens eyes to voice and remains open, on trach collar Follows limited commands on the right with right toe wiggling and showing thumb but did not show two fingers or follow other commands.  Cranial Nerves II - XII - II - Eyes open, flinches to bilateral visual threats III, IV, VI - PERRL, eye middle position, does not follow commands to track VII - left facial droop  XII - tongue midline in mouth  Motor Strength - RUE strength proximal 0/5 due to humerus farcture, right UE and LE distal movement with raising thumb and moves toes to command, No LUE movement, LLE withdraws to pain  Sensation, coordination and gait not tested.   ASSESSMENT/PLAN Mr. Graeson Nouri is a 61 y.o. male with history of a previous stroke, hypertension, alcohol abuse, diabetes, COPD, and atrial fibrillation on Xarelto with recent falls presenting with increased confusion with increased work of breathing. He did not receive IV t-PA due to ICH.  ICH - large ICH right parietal, likely hypertension in the setting of anticoagulation  Resultant left hemi-plegia and right gaze preference  CT head - Large intracranial hemorrhage centered in the right parietal lobe. 17 mm of right to left midline shift.   Consider MRI and MRA to rule out aneurysm or AVM once hematoma resolves and medically more stable   Carotid Doppler - left 40-59% stenosis  2D Echo - LA dilated, EF 55-60%  LDL - 66  HgbA1c - 5.9  VTE prophylaxis - heparin subq Diet NPO time specified  aspirin 81 mg daily and Xarelto (rivaroxaban) daily prior to admission, now on No antithrombotic.   Ongoing aggressive stroke risk factor management  Therapy recommendations: pending  Disposition: Pending  Afib  Was on Xarelto PTA  Currently on cardizem for rate  control  Hold off AC due to ICH  May consider resume DOAC with eliquis once hematoma resolves and medically stable  Respiratory failure  Trach 02/10/17 and tolerating well  On trach collar  CCM following  Fever   Tmax 101.3  BCx - contamination  UCx - insignificant growth  On Rocephin  CCM following  Hypertension  BP goal <160 SBP   On the high side  Started low dose lisinopril  Increased metoprolol dose   Long-term BP goal normotensive  Diabetes mellitus  Hemoglobin A1c - 5.9  Controlled  SSI  CBG monitoring  Cytotoxic Cerebral Edema  3% saline 02/06/2017 now off  Na 149->148->147  Tobacco abuse  Current smoker  Smoking cessation counseling will be provided  Other Stroke Risk Factors  Advanced age  ETOH use, will be advised to limit alcohol use - on FA/MVI/B1  Hx stroke/TIA  Other Active Problems  Sepsis - Zosyn -> rocephin  Tube feeding   Right humerus fracture off splint  Hospital day # 7  This patient is critically ill due to large right ICH with cerebral edema, A. fib with RVR, chronic alcoholism and at significant risk of neurological worsening, death form cerebral edema, peripheral herniation, hematoma expansion, seizure, heart failure, alcohol withdraw. This patient's care requires constant monitoring of vital signs, hemodynamics, respiratory and cardiac monitoring, review of multiple databases, neurological assessment, discussion with family, other specialists and medical decision making of high complexity. I spent 35 minutes of neurocritical care time in the care of this patient.  Neurology will follow up peripherally. Please call with questions. Pt will follow up with stroke clinic at [facility] in about 2 months. Thanks for the consult.   Marvel Plan, MD PhD Stroke Neurology 02/11/2017 3:30 PM   To contact Stroke Continuity provider, please refer to [email or web address]. After hours, contact General Neurology

## 2017-02-12 LAB — BASIC METABOLIC PANEL
Anion gap: 6 (ref 5–15)
BUN: 15 mg/dL (ref 6–20)
CHLORIDE: 111 mmol/L (ref 101–111)
CO2: 29 mmol/L (ref 22–32)
CREATININE: 0.69 mg/dL (ref 0.61–1.24)
Calcium: 8.4 mg/dL — ABNORMAL LOW (ref 8.9–10.3)
GFR calc non Af Amer: >60 mL/min (ref >60)
Glucose, Bld: 225 mg/dL — ABNORMAL HIGH (ref 65–99)
POTASSIUM: 3.4 mmol/L — AB (ref 3.5–5.1)
SODIUM: 146 mmol/L — AB (ref 135–145)

## 2017-02-12 LAB — VAS US CAROTID
LCCAPDIAS: 15 cm/s
LCCAPSYS: 78 cm/s
LEFT ECA DIAS: -22 cm/s
LICADSYS: -68 cm/s
Left CCA dist dias: -19 cm/s
Left CCA dist sys: -68 cm/s
Left ICA dist dias: -15 cm/s
Left ICA prox dias: -12 cm/s
Left ICA prox sys: -70 cm/s
RCCADSYS: -67 cm/s
RIGHT CCA MID DIAS: -21 cm/s
RIGHT ECA DIAS: -16 cm/s
RIGHT VERTEBRAL DIAS: -14 cm/s
Right CCA prox dias: 13 cm/s
Right CCA prox sys: 76 cm/s

## 2017-02-12 LAB — GLUCOSE, CAPILLARY
GLUCOSE-CAPILLARY: 189 mg/dL — AB (ref 65–99)
GLUCOSE-CAPILLARY: 199 mg/dL — AB (ref 65–99)
GLUCOSE-CAPILLARY: 217 mg/dL — AB (ref 65–99)
GLUCOSE-CAPILLARY: 196 mg/dL — AB (ref 65–99)
GLUCOSE-CAPILLARY: 194 mg/dL — AB (ref 65–99)
GLUCOSE-CAPILLARY: 232 mg/dL — AB (ref 65–99)

## 2017-02-12 MED ORDER — LISINOPRIL 20 MG PO TABS
20.0000 mg | ORAL_TABLET | Freq: Every day | ORAL | Status: DC
  Administered 2017-02-12 – 2017-02-23 (×11): 20 mg
  Filled 2017-02-12 (×11): qty 1

## 2017-02-12 MED ORDER — FENTANYL CITRATE (PF) 100 MCG/2ML IJ SOLN
25.0000 ug | INTRAMUSCULAR | Status: DC | PRN
  Administered 2017-02-13 – 2017-02-18 (×6): 50 ug via INTRAVENOUS
  Filled 2017-02-12 (×9): qty 2

## 2017-02-12 MED ORDER — INSULIN GLARGINE 100 UNIT/ML ~~LOC~~ SOLN
15.0000 [IU] | Freq: Every day | SUBCUTANEOUS | Status: DC
  Administered 2017-02-12 – 2017-02-13 (×2): 15 [IU] via SUBCUTANEOUS
  Filled 2017-02-12 (×2): qty 0.15

## 2017-02-12 MED ORDER — LISINOPRIL 20 MG PO TABS
20.0000 mg | ORAL_TABLET | Freq: Every day | ORAL | Status: DC
  Filled 2017-02-12: qty 1

## 2017-02-12 NOTE — Progress Notes (Signed)
Rt NOTE-increased rr in the 33's. Placed back on PS/Cpap. Patient continues to have cuff leak, receiving volumes, discussed with RN. Continue to monitor.

## 2017-02-12 NOTE — Progress Notes (Signed)
PULMONARY / CRITICAL CARE MEDICINE   Name: Stuart Mendoza MRN: [id number] DOB: August 08, 1955    ADMISSION DATE:  02/04/2017  REFERRING MD:  Dr. Sheria Lang, ER  CHIEF COMPLAINT:  Altered mental status  HISTORY OF PRESENT ILLNESS:   61 yo male smoker brought to ER with altered mental status.  Found to have large Rt parietal ICH with IVH and 1.7 cm midline shift with hydrocephalus.  Has hx of a fib on xarelto.  Intubated for airway, and given Kcentra.  Had recent admission (9/29 to 10/04) for alcohol withdrawal.  PMHx COPD, DM, HTN.  SUBJECTIVE:  Tolerated ATC All day on 10/13 Still has a slight cuff leak when ventilated Hypertension,lisinopril started, metoprolol increased  VITAL SIGNS: BP (!) 152/89 (BP Location: Left Arm)   Pulse (!) 109   Temp (!) 100.8 F (38.2 C) (Oral)   Resp (!) 28   Ht  (1.778 m)   Wt 94.6 kg (208 lb 8.9 oz)   SpO2 100%   BMI 29.92 kg/m   VENTILATOR SETTINGS: Vent Mode: PRVC FiO2 (%):  [40 %] 40 % Set Rate:  [14 bmp] 14 bmp Vt Set:  [620 mL] 620 mL PEEP:  [5 cmH20] 5 cmH20 Plateau Pressure:  [12 cmH20-19 cmH20] 19 cmH20  INTAKE / OUTPUT: I/O last 3 completed shifts: In: 2485 [I.V.:185; NG/GT:2250; IV Piggyback:50] Out: 4365 [Urine:4365]  PHYSICAL EXAMINATION:  General: ill-appearing man, on ATC Neuro:tried to open eyes for me to voice has wiggled toes to command HEENT: trach in good position, clean dry, strong cough PULM: coarse bilaterally CV: rregular, tachycardic mild hypertension GI: obese, soft, positive bowel sounds Extremities: trace bilateral lower ext edema     LABS:  BMET  Recent Labs Lab 02/10/17 0504 02/11/17 0650 02/12/17 0653  NA 148* 147* 146*  K 3.7 3.7 3.4*  CL 112* 112* 111  CO2 BUN CREATININE 0.73 0.78 0.69  GLUCOSE 170* 184* 225*    Electrolytes  Recent Labs Lab 02/05/17 1820  02/06/17 0410 02/06/17 1824  02/10/17 0504 02/11/17 0650 02/12/17 0653  CALCIUM  --   < > 8.8*   --   < > 8.5* 8.5* 8.4*  MG 2.3  --  2.3 2.3  --   --   --   --   PHOS 1.6*  --  2.2* 2.8  --   --   --   --   < > = values in this interval not displayed.  CBC  Recent Labs Lab 02/08/17 0339 02/09/17 0429 02/10/17 0504  WBC 11.4* 14.5* 13.1*  HGB 8.7* 9.2* 9.3*  HCT 31.1* 32.6* 32.6*  PLT 214 204 225    Coag's  Recent Labs Lab 02/06/17 0410  INR 1.23    Sepsis Markers No results for input(s): LATICACIDVEN, PROCALCITON, O2SATVEN in the last 168 hours.  ABG No results for input(s): PHART, PCO2ART, PO2ART in the last 168 hours.  Liver Enzymes No results for input(s): AST, ALT, ALKPHOS, BILITOT, ALBUMIN in the last 168 hours.  Cardiac Enzymes No results for input(s): TROPONINI, PROBNP in the last 168 hours.  Glucose  Recent Labs Lab 02/11/17 1226 02/11/17 1611 02/11/17 1927 02/11/17 2322 02/12/17 0309 02/12/17 0813  GLUCAP 222* 183* 199* 204* 189* 199*    Imaging No results found.   STUDIES:  CT head 10/6 >> Large ICH Rt parietal lobe.  1.7 cm of midline shift.  Small amount of hemorrhage in the Lt frontal lobe and a  small amount of subdural blood. CT head 10/9>>>unchanged  CULTURES: Blood 10/6 >> 1/2 Coag neg staph, likely MRSE based on [facility] ID Urine 10/6 >> negative Sputum 10/6 >>  ANTIBIOTICS: Vancomycin 10/6 >>10/8 Zosyn 10/6 >> 10/7 CTX 10/8>>> 10/14  SIGNIFICANT EVENTS: 10/6 Admit, neurology, neurosurgery consulted, kcentra, 3% NS 10/12 remains with reasonable neurostatus  LINES/TUBES: ETT 10/06 >> 10/12 Trach #6 Shiley (DF) 10/12 >>   ASSESSMENT / PLAN:  NEURO ICH, IVH, vent dyschrony Continue daily wakeup assessments Stop fentanyl drip 10/14, use low-dose fentanyl pushes as needed Allow sodium to continue to correct PT/OT  CVS Hypertension. A fib with RVR Some ectopy 10/11, none currently Continue diltiazem scheduled, metoprolol scheduled. Metoprolol was increased on 10/13 Increase lisinopril to 20 mg daily on  10/14  PULM Acute respiratory failure with compromised airway Status post tracheostomy, note cuff leak when ventilated. Cuff balloon appears to be holding air. Question whether he may require an XLT if he continues to require ventilation. Likely a non-issue if he can continue trach collar Aspiration pneumonia. Hx of COPD. Collapse rt lung, imrpoved Ceftriaxone scheduled to and 10/14 Continue trach collar, goal 247  Hx of ETOH. Thiamine, folic acid, multivitamin  DM type II. Continue sliding scale insulin Add Lantus 10/14  renal Hypokalemia. Induced hypernatremia, resolving Free water held, allowing sodium to slowly correct Follow BMP, urine output Replace electrolytes as indicated  Anemia of critical illness and chronic disease. Follow CBC intermittently  Aspiration PNA Completes course of ceftriaxone 10/14 Plan to follow clinically off antibiotics when completed  GI PPI Tube feeding  Independent CC time 31 minutes  Levy Pupa, MD, PhD 02/12/2017, 9:12 AM [facility] [phone] or if no answer [phone]

## 2017-02-13 ENCOUNTER — Inpatient Hospital Stay (HOSPITAL_COMMUNITY): Payer: Medicare Other

## 2017-02-13 LAB — MAGNESIUM: Magnesium: 2.2 mg/dL (ref 1.7–2.4)

## 2017-02-13 LAB — POCT I-STAT 3, ART BLOOD GAS (G3+)
ACID-BASE EXCESS: 6 mmol/L — AB (ref 0.0–2.0)
Bicarbonate: 29.4 mmol/L — ABNORMAL HIGH (ref 20.0–28.0)
O2 SAT: 99 %
PH ART: 7.52 — AB (ref 7.350–7.450)
PO2 ART: 123 mmHg — AB (ref 83.0–108.0)
Patient temperature: 100.6
TCO2: 30 mmol/L (ref 22–32)
pCO2 arterial: 36.3 mmHg (ref 32.0–48.0)

## 2017-02-13 LAB — CBC WITH DIFFERENTIAL/PLATELET
BASOS ABS: 0 10*3/uL (ref 0.0–0.1)
Basophils Relative: 0 %
Eosinophils Absolute: 0 10*3/uL (ref 0.0–0.7)
Eosinophils Relative: 0 %
HEMATOCRIT: 31.4 % — AB (ref 39.0–52.0)
Hemoglobin: 9.2 g/dL — ABNORMAL LOW (ref 13.0–17.0)
LYMPHS PCT: 7 %
Lymphs Abs: 0.9 10*3/uL (ref 0.7–4.0)
MCH: 26.3 pg (ref 26.0–34.0)
MCHC: 29.3 g/dL — ABNORMAL LOW (ref 30.0–36.0)
MCV: 89.7 fL (ref 78.0–100.0)
MONO ABS: 1.1 10*3/uL — AB (ref 0.1–1.0)
Monocytes Relative: 9 %
Neutro Abs: 10.8 10*3/uL — ABNORMAL HIGH (ref 1.7–7.7)
Neutrophils Relative %: 84 %
Platelets: 298 10*3/uL (ref 150–400)
RBC: 3.5 MIL/uL — AB (ref 4.22–5.81)
RDW: 16.3 % — ABNORMAL HIGH (ref 11.5–15.5)
WBC: 12.8 10*3/uL — ABNORMAL HIGH (ref 4.0–10.5)

## 2017-02-13 LAB — COMPREHENSIVE METABOLIC PANEL
ALBUMIN: 2.3 g/dL — AB (ref 3.5–5.0)
ALT: 70 U/L — ABNORMAL HIGH (ref 17–63)
ANION GAP: 6 (ref 5–15)
AST: 52 U/L — ABNORMAL HIGH (ref 15–41)
Alkaline Phosphatase: 181 U/L — ABNORMAL HIGH (ref 38–126)
BILIRUBIN TOTAL: 0.5 mg/dL (ref 0.3–1.2)
BUN: 18 mg/dL (ref 6–20)
CHLORIDE: 113 mmol/L — AB (ref 101–111)
CO2: 29 mmol/L (ref 22–32)
Calcium: 8.6 mg/dL — ABNORMAL LOW (ref 8.9–10.3)
Creatinine, Ser: 0.75 mg/dL (ref 0.61–1.24)
GFR calc Af Amer: >60 mL/min (ref >60)
GFR calc non Af Amer: >60 mL/min (ref >60)
GLUCOSE: 164 mg/dL — AB (ref 65–99)
POTASSIUM: 2.8 mmol/L — AB (ref 3.5–5.1)
SODIUM: 148 mmol/L — AB (ref 135–145)
TOTAL PROTEIN: 6 g/dL — AB (ref 6.5–8.1)

## 2017-02-13 LAB — GLUCOSE, CAPILLARY
GLUCOSE-CAPILLARY: 226 mg/dL — AB (ref 65–99)
GLUCOSE-CAPILLARY: 159 mg/dL — AB (ref 65–99)
Glucose-Capillary: 192 mg/dL — ABNORMAL HIGH (ref 65–99)
Glucose-Capillary: 186 mg/dL — ABNORMAL HIGH (ref 65–99)
Glucose-Capillary: 257 mg/dL — ABNORMAL HIGH (ref 65–99)
Glucose-Capillary: 176 mg/dL — ABNORMAL HIGH (ref 65–99)
Glucose-Capillary: 218 mg/dL — ABNORMAL HIGH (ref 65–99)

## 2017-02-13 LAB — PHOSPHORUS: PHOSPHORUS: 2.8 mg/dL (ref 2.5–4.6)

## 2017-02-13 MED ORDER — INSULIN GLARGINE 100 UNIT/ML ~~LOC~~ SOLN
5.0000 [IU] | Freq: Once | SUBCUTANEOUS | Status: AC
  Administered 2017-02-13: 5 [IU] via SUBCUTANEOUS
  Filled 2017-02-13: qty 0.05

## 2017-02-13 MED ORDER — POTASSIUM CHLORIDE 20 MEQ/15ML (10%) PO SOLN
40.0000 meq | Freq: Once | ORAL | Status: AC
  Administered 2017-02-13: 40 meq
  Filled 2017-02-13: qty 30

## 2017-02-13 MED ORDER — ORAL CARE MOUTH RINSE
15.0000 mL | OROMUCOSAL | Status: DC
  Administered 2017-02-13 – 2017-02-26 (×61): 15 mL via OROMUCOSAL

## 2017-02-13 MED ORDER — POTASSIUM CHLORIDE 10 MEQ/100ML IV SOLN
10.0000 meq | INTRAVENOUS | Status: AC
  Administered 2017-02-13 (×4): 10 meq via INTRAVENOUS
  Filled 2017-02-13 (×4): qty 100

## 2017-02-13 MED ORDER — INSULIN GLARGINE 100 UNIT/ML ~~LOC~~ SOLN
20.0000 [IU] | Freq: Every day | SUBCUTANEOUS | Status: DC
  Administered 2017-02-14: 20 [IU] via SUBCUTANEOUS
  Filled 2017-02-13 (×2): qty 0.2

## 2017-02-13 NOTE — Progress Notes (Signed)
Nutrition Follow-up  DOCUMENTATION CODES:   Obesity unspecified  INTERVENTION:   Vital 1.5 @goal  rate of 20m/hr Prostat 360mdaily   Regimen provides 2260kcal/day, 112g/day protein, 110077may free water   Adjust TF based on blood sugar trends  NUTRITION DIAGNOSIS:   Inadequate oral intake related to inability to eat as evidenced by NPO status. Ongoing.   GOAL:   Provide needs based on ASPEN/SCCM guidelines Met.   MONITOR:   Vent status, Labs, Weight trends, TF tolerance  ASSESSMENT:   [age] male smoker brought to ER with altered mental status.  Found to have larged Rt parietal ICH with IVH and 1.7 cm midline shift with hydrocephalus.  Has hx of a fib on xarelto.  Intubated for airway, and given Kcentra.  Had recent admission (9/29 to 10/04) for alcohol withdrawal.  PMHx COPD, DM, HTN.  Pt discussed during ICU rounds and with RN.  10/12 trach and Cortrak tube placed Lantus added 10/14  Patient on ventilator support at night and some during the day. MV: 12.9 L/min Temp (24hrs), Avg:100 F (37.8 C), Min:99.2 F (37.3 C), Max:101.1 F (38.4 C)  Medications reviewed and include: colace, folvite, lantus (inrc 20 u daily), MVI, thiamine   Labs reviewed: Na 146 (H), K+ 3.4 (L) CBG's: [id number]: Vital 1.5 @ 60 ml/hr, 30 ml Prostat daily  Diet Order:  Diet NPO time specified  Skin:   (non pressure wound buttocks)  Last BM:  10/15 medium  Height:   Ht Readings from Last 1 Encounters:  02/07/17 5' 10"  (1.778 m)    Weight:   Wt Readings from Last 1 Encounters:  02/13/17 200 lb 6.4 oz (90.9 kg)    Ideal Body Weight:  75.4 kg  BMI:  Body mass index is 28.75 kg/m.  Estimated Nutritional Needs:   Kcal:  2105kcal/day   Protein:  104-123g/day   Fluid:  >2L/day   EDUCATION NEEDS:   Education needs no appropriate at this time  [place]uck[place] [phone] Hours Pager

## 2017-02-13 NOTE — Progress Notes (Signed)
PULMONARY / CRITICAL CARE MEDICINE   Name: Stuart Mendoza MRN: [id number] DOB: 1955/06/06    ADMISSION DATE:  02/04/2017  REFERRING MD:  Dr. Sheria Lang, ER  CHIEF COMPLAINT:  Altered mental status  HISTORY OF PRESENT ILLNESS:   61yo M with Afib (on xarelto), COPD, HTN, DM, and Etoh abuse, presented to ER on 10/6 with AMS and found to have large right parietal ICH with IVH and 1.7 cm midline shift with hydrocephalus. Intubated for airway, and given Kcentra.  Found to have RUL HCAP pneumonia. Had recent admission (9/29 to 10/04) for alcohol withdrawal and right humerus fracture (12/2016).   SUBJECTIVE:  S/p Tracheostomy 10/12 Tolerated ATC All day on 10/13, Went back on vent 10/14 PM due to dyspnea.  Still has a slight cuff leak when ventilated This AM patient back on TC trial, has RR 35-40 and thick purulent secretions from Trach. Had Temp 100.6 early this AM.  VITAL SIGNS: BP (!) 171/84   Pulse (!) 102   Temp 99.2 F (37.3 C)   Resp (!) 22   Ht  (1.778 m)   Wt 90.9 kg (200 lb 6.4 oz)   SpO2 97%   BMI 28.75 kg/m   VENTILATOR SETTINGS: Vent Mode: PRVC FiO2 (%):  [30 %-40 %] 40 % Set Rate:  [14 bmp] 14 bmp Vt Set:  [620 mL] 620 mL PEEP:  [5 cmH20] 5 cmH20 Pressure Support:  [10 cmH20] 10 cmH20 Plateau Pressure:  [11 cmH20-15 cmH20] 14 cmH20  INTAKE / OUTPUT: I/O last 3 completed shifts: In: 2307.8 [I.V.:67.8; NG/GT:2190; IV Piggyback:50] Out: 3130 [Urine:3130]  PHYSICAL EXAMINATION: General: ill-appearing man, trach on trach collar, minimally responsive Neuro: eyes closed and does not open to voice or touch. No response to sternal rub on my exam and not obeying my commands. But for RN he had minimal toe movement and right hand grasp to command but eyes still not opened. PERRL HEENT: trach in good position, copious purulent thick secretions present surrounding trach.  PULM: coarse bilaterally, Tachypnea with RR 35-40 CV: tachycardic to 100's with regular rhythm.   GI: obese, soft, positive bowel sounds Extremities: RUE swelling; no LE edema.    LABS:  BMET  Recent Labs Lab 02/10/17 0504 02/11/17 0650 02/12/17 0653  NA 148* 147* 146*  K 3.7 3.7 3.4*  CL 112* 112* 111  CO2 BUN CREATININE 0.73 0.78 0.69  GLUCOSE 170* 184* 225*    Electrolytes  Recent Labs Lab 02/06/17 1824  02/10/17 0504 02/11/17 0650 02/12/17 0653  CALCIUM  --   < > 8.5* 8.5* 8.4*  MG 2.3  --   --   --   --   PHOS 2.8  --   --   --   --   < > = values in this interval not displayed.  CBC  Recent Labs Lab 02/08/17 0339 02/09/17 0429 02/10/17 0504  WBC 11.4* 14.5* 13.1*  HGB 8.7* 9.2* 9.3*  HCT 31.1* 32.6* 32.6*  PLT 214 204 225    Coag's No results for input(s): APTT, INR in the last 168 hours.  Sepsis Markers No results for input(s): LATICACIDVEN, PROCALCITON, O2SATVEN in the last 168 hours.  ABG No results for input(s): PHART, PCO2ART, PO2ART in the last 168 hours.  Liver Enzymes No results for input(s): AST, ALT, ALKPHOS, BILITOT, ALBUMIN in the last 168 hours.  Cardiac Enzymes No results for input(s): TROPONINI, PROBNP in the last 168 hours.  Glucose  Recent Labs Lab 02/12/17 1554 02/12/17 1946 02/12/17 2319 02/13/17 0339 02/13/17 0751 02/13/17 1119  GLUCAP 196* 194* 232* 226* 186* 257*    Imaging No results found.   STUDIES:  CT head 10/6 >> Large ICH Rt parietal lobe.  1.7 cm of midline shift.  Small amount of hemorrhage in the Lt frontal lobe and a small amount of subdural blood. CT head 10/9>>>unchanged  CULTURES: Blood 10/6 >> 2/4 Coag neg staph, likely MRSE based on [facility] ID Urine 10/6 >> less than 10k colonies insignificant growth Sputum 10/6 >> culture order was cancelled  ANTIBIOTICS: Vancomycin 10/6 >>10/8 Zosyn 10/6 >> 10/7 CTX 10/8>>> 10/14  SIGNIFICANT EVENTS: 10/6 Admit, neurology, neurosurgery consulted, kcentra, 3% NS 10/12 remains with reasonable neurostatus 10/15: minimally  responsive which sounds worse than prior baseline last week based on EMR notes.   LINES/TUBES: ETT 10/06 >> 10/12 Trach #6 Shiley (DF) 10/12 >>  L [place] TLC 10/6 >>  ASSESSMENT / PLAN: 61yo M with Afib (on xarelto), COPD, HTN, DM, and Etoh abuse, admit 10/6 with large right parietal ICH with IVH and 1.7 cm midline shift with hydrocephalus. Acute Hypoxic Respiratory failure with HCAP pneumonia. Had recent admission (9/29 to 10/04) for alcohol withdrawal and right humerus fracture (12/2016).   NEURO 1. ICH, IVH - 1.7cm midline shift on CT Head 10/6, unchanged on 10/9 - PT/OT - has NG tube; will likely need a PEG  2. Hx ETOH abuse:  - continue Thiamine, folic acid, multivitamin  CARDIOVASCULAR 1. Hx Hypertension: - continue metoprolol, lisinopril  2. Hx Afib: - off therapeutic anticoag now given ICH - continue dilt  PO q6hrs in addition to Metop.  - electrolyte repletion PRN  PULM 1. Acute Hypoxic respiratory failure; HCAP pneumonia: - s/p perc trach 10/12; Now doing trach collar trials  - completed 9 day antibiotic course already  - on my exam today patient with RR 35-40 and increasing thick purulent secretions and fever. Concern may have a new pneumonia.  - obtain new sputum culture. If fevers again will obtain blood and urine cultures as well - obtain new CXR (last was 10/12) and new ABG (last on 10/7) - take off trach collar trial at this point given significant tachypnea. Place back on supported vent settings - RN and RT report intermittent cuff leak despite no blown cuff on the trach. Possible that he is not getting a good seal due to the copious tracheal secretions, versus me may need a distal XLT.   2. Hx COPD: - continue albuterol nebs   INFECTIOUS DISEASE 1. Fever; Question new Pneumonia: - see plan above.   ENDOCRINE: 1. DM type II: remains hyperglycemic - Continue sliding scale insulin - increase Lantus from 15u to 20u daily  RENAL: 1. Hypokalemia; Induced  hypernatremia (resolving): - no labs ordered for this AM for unclear reasons; will check electrolytes now.  - Free water held, allowing sodium to slowly correct - Monitor UOP; avoid nephrotoxic agents  HEME: 1. Anemia of critical illness and chronic disease: - had been stable; no labs this AM; check CBC now   MUSCULOSKELETAL: 1. Right Humerus fracture: - diagnosed 01/27/17 during a prior admission; occurred prior to admission when patient fell when walking his dog.  - had been maintained in a sling with plan for ortho followup. Now is not moving his right arm so sling is not needed at this time.  - right arm is more swollen than left arm but not clear if it was this way  on admission and cannot find clear documentation of arm exam from earlier this admission.   GI PPI Tube feeding  Independent CC time 60 minutes  Milana Obey, MD  02/13/2017, 1:54 PM [facility] [phone] or if no answer [phone]

## 2017-02-13 NOTE — Progress Notes (Signed)
Inpatient Diabetes Program Recommendations  AACE/ADA: New Consensus Statement on Inpatient Glycemic Control (2015)  Target Ranges:  Prepandial:   less than 140 mg/dL      Peak postprandial:   less than 180 mg/dL (1-2 hours)      Critically ill patients:  140 - 180 mg/dL   Lab Results  Component Value Date   GLUCAP 257 (H) 02/13/2017   HGBA1C 5.9 (H) 02/10/2017    Review of Glycemic Control Results for LIEM, COPENHAVER (MRN [id number]) as of 02/13/2017 13:41  Ref. Range 02/12/2017 19:46 02/12/2017 23:19 02/13/2017 03:39 02/13/2017 07:51 02/13/2017 11:19  Glucose-Capillary Latest Ref Range: 65 - 99 mg/dL 409 (H) 811 (H) 914 (H) 186 (H) 257 (H)   Inpatient Diabetes Program Recommendations:    Please consider: -Novolog 3 units q 4 hrs. Tube feed coverage (hold if feeding held or stopped)  Thank you, Darel Hong E. Zhamir Pirro, RN, MSN, CDE  Diabetes Coordinator Inpatient Glycemic Control Team Team Pager [phone] (8am-5pm) 02/13/2017 1:42 PM

## 2017-02-14 ENCOUNTER — Inpatient Hospital Stay (HOSPITAL_COMMUNITY): Payer: Medicare Other

## 2017-02-14 DIAGNOSIS — Z930 Tracheostomy status: Secondary | ICD-10-CM

## 2017-02-14 DIAGNOSIS — J9503 Malfunction of tracheostomy stoma: Secondary | ICD-10-CM

## 2017-02-14 LAB — CBC WITH DIFFERENTIAL/PLATELET
Basophils Absolute: 0 10*3/uL (ref 0.0–0.1)
Basophils Relative: 0 %
EOS ABS: 0 10*3/uL (ref 0.0–0.7)
EOS PCT: 0 %
HCT: 25.2 % — ABNORMAL LOW (ref 39.0–52.0)
HCT: 33.2 % — ABNORMAL LOW (ref 39.0–52.0)
HEMOGLOBIN: 10 g/dL — AB (ref 13.0–17.0)
Hemoglobin: 8.4 g/dL — ABNORMAL LOW (ref 13.0–17.0)
LYMPHS ABS: 0.9 10*3/uL (ref 0.7–4.0)
LYMPHS PCT: 5 %
MCH: 30.9 pg (ref 26.0–34.0)
MCH: 27.2 pg (ref 26.0–34.0)
MCHC: 33.3 g/dL (ref 30.0–36.0)
MCHC: 30.1 g/dL (ref 30.0–36.0)
MCV: 92.6 fL (ref 78.0–100.0)
MCV: 90.5 fL (ref 78.0–100.0)
MONOS PCT: 8 %
Monocytes Absolute: 1.3 10*3/uL — ABNORMAL HIGH (ref 0.1–1.0)
NEUTROS PCT: 87 %
Neutro Abs: 14.5 10*3/uL — ABNORMAL HIGH (ref 1.7–7.7)
PLATELETS: 328 10*3/uL (ref 150–400)
Platelets: 318 10*3/uL (ref 150–400)
RBC: 2.72 MIL/uL — AB (ref 4.22–5.81)
RBC: 3.67 MIL/uL — ABNORMAL LOW (ref 4.22–5.81)
RDW: 23.4 % — AB (ref 11.5–15.5)
RDW: 16.5 % — ABNORMAL HIGH (ref 11.5–15.5)
WBC: 16.6 10*3/uL — ABNORMAL HIGH (ref 4.0–10.5)

## 2017-02-14 LAB — URINALYSIS, ROUTINE W REFLEX MICROSCOPIC
Bacteria, UA: NONE SEEN
Bilirubin Urine: NEGATIVE
HGB URINE DIPSTICK: NEGATIVE
Ketones, ur: NEGATIVE mg/dL
Leukocytes, UA: NEGATIVE
Nitrite: NEGATIVE
PH: 5 (ref 5.0–8.0)
PROTEIN: NEGATIVE mg/dL
SPECIFIC GRAVITY, URINE: 1.022 (ref 1.005–1.030)
Squamous Epithelial / LPF: NONE SEEN

## 2017-02-14 LAB — BASIC METABOLIC PANEL
ANION GAP: 8 (ref 5–15)
Anion gap: 6 (ref 5–15)
BUN: <5 mg/dL — ABNORMAL LOW (ref 6–20)
BUN: 30 mg/dL — AB (ref 6–20)
CALCIUM: 9.2 mg/dL (ref 8.9–10.3)
CHLORIDE: 116 mmol/L — AB (ref 101–111)
CO2: 24 mmol/L (ref 22–32)
CO2: 28 mmol/L (ref 22–32)
CREATININE: 0.8 mg/dL (ref 0.61–1.24)
Calcium: 8.9 mg/dL (ref 8.9–10.3)
Chloride: 104 mmol/L (ref 101–111)
Creatinine, Ser: 0.56 mg/dL — ABNORMAL LOW (ref 0.61–1.24)
GFR calc Af Amer: >60 mL/min (ref >60)
GFR calc Af Amer: >60 mL/min (ref >60)
GFR calc non Af Amer: >60 mL/min (ref >60)
GLUCOSE: 90 mg/dL (ref 65–99)
Glucose, Bld: 228 mg/dL — ABNORMAL HIGH (ref 65–99)
POTASSIUM: 3.4 mmol/L — AB (ref 3.5–5.1)
Potassium: 3.7 mmol/L (ref 3.5–5.1)
SODIUM: 136 mmol/L (ref 135–145)
Sodium: 150 mmol/L — ABNORMAL HIGH (ref 135–145)

## 2017-02-14 LAB — GLUCOSE, CAPILLARY
GLUCOSE-CAPILLARY: 174 mg/dL — AB (ref 65–99)
GLUCOSE-CAPILLARY: 203 mg/dL — AB (ref 65–99)
GLUCOSE-CAPILLARY: 244 mg/dL — AB (ref 65–99)
GLUCOSE-CAPILLARY: 203 mg/dL — AB (ref 65–99)
Glucose-Capillary: 217 mg/dL — ABNORMAL HIGH (ref 65–99)
Glucose-Capillary: 213 mg/dL — ABNORMAL HIGH (ref 65–99)

## 2017-02-14 LAB — PROCALCITONIN: Procalcitonin: <0.1 ng/mL

## 2017-02-14 LAB — BLOOD GAS, ARTERIAL
ACID-BASE EXCESS: 4.1 mmol/L — AB (ref 0.0–2.0)
Bicarbonate: 26.9 mmol/L (ref 20.0–28.0)
DRAWN BY: 365291
FIO2: 40
MECHVT: 620 mL
O2 SAT: 99 %
PATIENT TEMPERATURE: 98.6
PEEP/CPAP: 5 cmH2O
PH ART: 7.534 — AB (ref 7.350–7.450)
PO2 ART: 124 mmHg — AB (ref 83.0–108.0)
RATE: 19 resp/min
pCO2 arterial: 32 mmHg (ref 32.0–48.0)

## 2017-02-14 LAB — PHOSPHORUS
Phosphorus: 4.7 mg/dL — ABNORMAL HIGH (ref 2.5–4.6)
Phosphorus: 3 mg/dL (ref 2.5–4.6)

## 2017-02-14 LAB — LACTIC ACID, PLASMA: Lactic Acid, Venous: 1.5 mmol/L (ref 0.5–1.9)

## 2017-02-14 LAB — MAGNESIUM
MAGNESIUM: 2.4 mg/dL (ref 1.7–2.4)
Magnesium: 1.7 mg/dL (ref 1.7–2.4)

## 2017-02-14 MED ORDER — POTASSIUM CHLORIDE 20 MEQ/15ML (10%) PO SOLN
40.0000 meq | Freq: Once | ORAL | Status: AC
  Administered 2017-02-14: 40 meq
  Filled 2017-02-14: qty 30

## 2017-02-14 MED ORDER — FREE WATER
30.0000 mL | Status: DC
  Administered 2017-02-14 – 2017-02-15 (×3): 30 mL

## 2017-02-14 MED ORDER — PIPERACILLIN-TAZOBACTAM 3.375 G IVPB
3.3750 g | Freq: Three times a day (TID) | INTRAVENOUS | Status: AC
  Administered 2017-02-14 – 2017-02-21 (×24): 3.375 g via INTRAVENOUS
  Filled 2017-02-14 (×25): qty 50

## 2017-02-14 NOTE — Progress Notes (Signed)
Pharmacy Antibiotic Note  Stuart Mendoza is a 61 y.o. male admitted on 02/04/2017 with pneumonia.  Pharmacy has been consulted for Zosyn dosing.  Plan: Zosyn 3.375g IV q8h EI Follow c/s, clinical progression, renal function, length of therapy  Height:  (177.8 cm) Weight: 201 lb 1 oz (91.2 kg) IBW/kg (Calculated) : 73  Temp (24hrs), Avg:99.4 F (37.4 C), Min:98.2 F (36.8 C), Max:100.8 F (38.2 C)   Recent Labs Lab 02/07/17 1143  02/08/17 0339 02/09/17 0429 02/10/17 0504 02/11/17 0650 02/12/17 0653 02/13/17 1419  WBC 12.1*  --  11.4* 14.5* 13.1*  --   --  12.8*  CREATININE  --   < > 0.90 0.78 0.73 0.78 0.69 0.75  < > = values in this interval not displayed.  Estimated Creatinine Clearance: 108.7 mL/min (by C-G formula based on SCr of 0.75 mg/dL).    Allergies  Allergen Reactions  . Metformin And Related     Zosyn 10/16>> CTX 10/8 >>10/14 Vanc 10/6 >> 10/8 Zosyn 10/6 >> 10/8  BCx - 10/15 TA - rare GNR 10/6 BCx - 1/2 MRSE 10/6 UCx - Insignificant growth  10/6 MRSA PCR - Negative    Thank you for allowing pharmacy to be a part of this patient's care.  Erryn Dickison D. Israa Caban, PharmD, BCPS Clinical Pharmacist Pager: [phone] Clinical Phone for 02/14/2017 until 3:30pm: [phone] If after 3:30pm, please call main pharmacy at [phone] 02/14/2017 8:29 AM

## 2017-02-14 NOTE — Progress Notes (Signed)
Tracheostomy tube change: Informed verbal consent was obtained after explaining the risks (including bleeding and infection), benefits and alternatives of the procedure. Verbal timeout was performed prior to the procedure. The old  # 6 cuffed  trach was carefully removed. the tracheostomy site appeared: unremarkable. A new #  Cuffed xlt size 6 distal  trach was easily placed in the tracheostomy stoma and secured with velcro trach ties. The tracheostomy was patent, good color change observed via EZ-CAP, and the patient was easily able to voice with finger occlusion and tolerated the procedure well with no immediate complications.       No complications   Simonne Martinet ACNP-BC [facility]/Critical Care Pager # [phone] OR # [phone] if no answer

## 2017-02-14 NOTE — Progress Notes (Signed)
PULMONARY / CRITICAL CARE MEDICINE   Name: Stuart Mendoza MRN: [id number] DOB: [birth year]    ADMISSION DATE:  02/04/2017  REFERRING MD:  Dr. Sheria Lang, ER  CHIEF COMPLAINT:  Altered mental status  HISTORY OF PRESENT ILLNESS:   61yo M with Afib (on xarelto), COPD, HTN, DM, and Etoh abuse, presented to ER on 10/6 with AMS and found to have large right parietal ICH with IVH and 1.7 cm midline shift with hydrocephalus. Intubated for airway, and given Kcentra.  Found to have RUL HCAP pneumonia. Had recent admission (9/29 to 10/04) for alcohol withdrawal and right humerus fracture (12/2016).   SUBJECTIVE:  10/12: s/p Tracheostomy 10/13: Tolerated ATC All day 10/14: Went back on vent 10/14 PM due to dyspnea.  10/15: on TC trial has RR 35-40 and thick purulent secretions from Trach. 10/16: febrile, continues to have thick purulent secretions via trach. Has intermittent cuff leak from trach for which trach was exchanged.   VITAL SIGNS: BP 126/72   Pulse (!) 122   Temp 99.5 F (37.5 C) (Axillary)   Resp (!) 27   Ht  (1.778 m)   Wt 91.2 kg (201 lb 1 oz)   SpO2 100%   BMI 28.85 kg/m   VENTILATOR SETTINGS: Vent Mode: PRVC FiO2 (%):  [40 %] 40 % Set Rate:  [14 bmp] 14 bmp Vt Set:  [620 mL] 620 mL PEEP:  [5 cmH20] 5 cmH20 Plateau Pressure:  [11 cmH20-14 cmH20] 12 cmH20  INTAKE / OUTPUT: I/O last 3 completed shifts: In: 2570 [I.V.:10; NG/GT:2160; IV Piggyback:400] Out: 2100 [Urine:2100]  PHYSICAL EXAMINATION: General: ill-appearing man, trach on vent, minimally responsive Neuro: eyes closed and does not open to voice or touch. Grimaces in response to suctioning of nose or mouth. not obeying my commands. PERRL HEENT: trach in good position, copious purulent thick secretions present surrounding trach.  PULM: coarse bilaterally, RR 20 CV: tachycardic to 100's with regular rhythm.  GI: obese, soft, positive bowel sounds Extremities: RUE swelling; no LE edema.     LABS:  BMET  Recent Labs Lab 02/12/17 0653 02/13/17 1419 02/14/17 1000  NA 146* 148* 136  K 3.4* 2.8* 3.7  CL 111 113* 104  CO2 BUN 15 18 <5*  CREATININE 0.69 0.75 0.56*  GLUCOSE 225* 164* 90    Electrolytes  Recent Labs Lab 02/12/17 0653 02/13/17 1419 02/14/17 1000  CALCIUM 8.4* 8.6* 9.2  MG  --  2.2 1.7  PHOS  --  2.8 4.7*    CBC  Recent Labs Lab 02/10/17 0504 02/13/17 1419 02/14/17 1000  WBC 13.1* 12.8* QUESTIONABLE RESULTS, RECOMMEND RECOLLECT TO VERIFY  HGB 9.3* 9.2* 8.4*  HCT 32.6* 31.4* 25.2*  PLT 225 298 328    Coag's No results for input(s): APTT, INR in the last 168 hours.  Sepsis Markers No results for input(s): LATICACIDVEN, PROCALCITON, O2SATVEN in the last 168 hours.  ABG  Recent Labs Lab 02/13/17 1518 02/14/17 0910  PHART 7.520* 7.534*  PCO2ART 36.3 32.0  PO2ART 123.0* 124*    Liver Enzymes  Recent Labs Lab 02/13/17 1419  AST 52*  ALT 70*  ALKPHOS 181*  BILITOT 0.5  ALBUMIN 2.3*    Cardiac Enzymes No results for input(s): TROPONINI, PROBNP in the last 168 hours.  Glucose  Recent Labs Lab 02/13/17 1930 02/13/17 2315 02/14/17 0321 02/14/17 0738 02/14/17 1156 02/14/17 1558  GLUCAP 176* 218* 174* 203* 244* 217*    Imaging No results found.   STUDIES:  CT head 10/6 >> Large ICH Rt parietal lobe.  1.7 cm of midline shift.  Small amount of hemorrhage in the Lt frontal lobe and a small amount of subdural blood. CT head 10/9>>>unchanged  CULTURES: Blood 10/6 >> 2/4 Coag neg staph, likely MRSE based on [facility] ID Urine 10/6 >> less than 10k colonies insignificant growth Sputum 10/6 >> culture order was cancelled  ANTIBIOTICS: Vancomycin 10/6 >>10/8 Zosyn 10/6 >> 10/7 CTX 10/8>>> 10/14  SIGNIFICANT EVENTS: 10/6 Admit, neurology, neurosurgery consulted, kcentra, 3% NS 10/12 remains with reasonable neurostatus 10/15: minimally responsive which sounds worse than prior baseline last week based on  EMR notes.  10/16: fever, pan-cultured, cuff leak so trach exchanged for #6 distal XLT  LINES/TUBES: ETT 10/06 >> 10/12 Trach #6 Shiley (DF) 10/12 >> 10/16 Trach #6 Distal XLT 10/16 >> L [place] TLC 10/6 >>  ASSESSMENT / PLAN: 61yo M with Afib (on xarelto), COPD, HTN, DM, and Etoh abuse, admit 10/6 with large right parietal ICH with IVH and 1.7 cm midline shift with hydrocephalus. Acute Hypoxic Respiratory failure with HCAP pneumonia. Had recent admission (9/29 to 10/04) for alcohol withdrawal and right humerus fracture (12/2016).   NEURO 1. ICH, IVH - 1.7cm midline shift on CT Head 10/6, unchanged on 10/9 - PT/OT - has NG tube; will likely need a PEG  2. Hx ETOH abuse:  - continue Thiamine, folic acid, multivitamin  CARDIOVASCULAR 1. Hx Hypertension: - continue metoprolol, lisinopril  2. Hx Afib: - off therapeutic anticoag now given ICH - continue dilt  PO q6hrs in addition to Metop.  - electrolyte repletion PRN  PULM 1. Acute Hypoxic respiratory failure; HCAP pneumonia: - s/p perc trach 10/12; was doing TC trials until developed tachypnea and fever on 10/15. Febrile again on 10/16.   - completed 9 day antibiotic course already. Now febrile again and purulent secretions from trach. Pan-cultured and started Zosyn.  - sputum culture (10/15): few GPC's in pairs, rare GNR's.  - check lactate and procalcitonin. - take off trach collar trial at this point given significant tachypnea. Place back on supported vent settings - exchanged trach to #6 distal XLT trach today due to cuff leak.  - CXR on my review shows no infiltrate; may be more of a tracheobronchitis he is developing.   2. Hx COPD: - continue albuterol nebs   INFECTIOUS DISEASE 1. Fever; Question new Pneumonia vs Tracheobronchitis: - see plan above.   ENDOCRINE: 1. DM type II: remains hyperglycemic - Continue sliding scale insulin - continue Lantus 20u daily  RENAL: 1. Hypokalemia: - labs this AM were hemolyzed;  repeat labs ordered at 11am but not performed yet. Will signs out to [facility] to follow-up.  - Free water held, allowing sodium to slowly correct - Monitor UOP; avoid nephrotoxic agents  HEME: 1. Anemia of critical illness and chronic disease: - had been stable   MUSCULOSKELETAL: 1. Right Humerus fracture: - diagnosed 01/27/17 during a prior admission; occurred prior to admission when patient fell when walking his dog.  - had been maintained in a sling with plan for ortho followup. Now is not moving his right arm so sling is not needed at this time.  - right arm is more swollen than left arm but not clear if it was this way on admission and cannot find clear documentation of arm exam from earlier this admission.   GI PPI Tube feeding  Independent CC time 40 minutes  Milana Obey, MD  02/14/2017, 4:25 PM [facility] [facility] [phone] or if no answer [phone]

## 2017-02-15 ENCOUNTER — Inpatient Hospital Stay (HOSPITAL_COMMUNITY): Payer: Medicare Other

## 2017-02-15 LAB — CBC WITH DIFFERENTIAL/PLATELET
BASOS ABS: 0 10*3/uL (ref 0.0–0.1)
Basophils Relative: 0 %
EOS ABS: 0 10*3/uL (ref 0.0–0.7)
EOS PCT: 0 %
HEMATOCRIT: 31.6 % — AB (ref 39.0–52.0)
HEMOGLOBIN: 9.2 g/dL — AB (ref 13.0–17.0)
LYMPHS PCT: 9 %
Lymphs Abs: 1.1 10*3/uL (ref 0.7–4.0)
MCH: 26.4 pg (ref 26.0–34.0)
MCHC: 29.1 g/dL — AB (ref 30.0–36.0)
MCV: 90.8 fL (ref 78.0–100.0)
MONO ABS: 1 10*3/uL (ref 0.1–1.0)
Monocytes Relative: 8 %
NEUTROS PCT: 83 %
Neutro Abs: 10 10*3/uL — ABNORMAL HIGH (ref 1.7–7.7)
Platelets: 327 10*3/uL (ref 150–400)
RBC: 3.48 MIL/uL — ABNORMAL LOW (ref 4.22–5.81)
RDW: 17.1 % — AB (ref 11.5–15.5)
WBC: 12.1 10*3/uL — AB (ref 4.0–10.5)

## 2017-02-15 LAB — SODIUM
Sodium: 154 mmol/L — ABNORMAL HIGH (ref 135–145)
Sodium: 149 mmol/L — ABNORMAL HIGH (ref 135–145)

## 2017-02-15 LAB — GLUCOSE, CAPILLARY
GLUCOSE-CAPILLARY: 192 mg/dL — AB (ref 65–99)
GLUCOSE-CAPILLARY: 221 mg/dL — AB (ref 65–99)
Glucose-Capillary: 164 mg/dL — ABNORMAL HIGH (ref 65–99)
Glucose-Capillary: 216 mg/dL — ABNORMAL HIGH (ref 65–99)
Glucose-Capillary: 230 mg/dL — ABNORMAL HIGH (ref 65–99)
Glucose-Capillary: 109 mg/dL — ABNORMAL HIGH (ref 65–99)

## 2017-02-15 LAB — BLOOD GAS, ARTERIAL
ACID-BASE EXCESS: 4.2 mmol/L — AB (ref 0.0–2.0)
BICARBONATE: 27.4 mmol/L (ref 20.0–28.0)
DRAWN BY: 44135
FIO2: 40
LHR: 14 {breaths}/min
MECHVT: 620 mL
O2 Saturation: 98.5 %
PATIENT TEMPERATURE: 99.5
PCO2 ART: 36.3 mmHg (ref 32.0–48.0)
PEEP/CPAP: 5 cmH2O
PH ART: 7.493 — AB (ref 7.350–7.450)
PO2 ART: 122 mmHg — AB (ref 83.0–108.0)

## 2017-02-15 LAB — BASIC METABOLIC PANEL
ANION GAP: 7 (ref 5–15)
BUN: 32 mg/dL — ABNORMAL HIGH (ref 6–20)
CO2: 26 mmol/L (ref 22–32)
CREATININE: 0.95 mg/dL (ref 0.61–1.24)
Calcium: 8.9 mg/dL (ref 8.9–10.3)
Chloride: 119 mmol/L — ABNORMAL HIGH (ref 101–111)
GFR calc non Af Amer: >60 mL/min (ref >60)
Glucose, Bld: 170 mg/dL — ABNORMAL HIGH (ref 65–99)
Potassium: 3.8 mmol/L (ref 3.5–5.1)
SODIUM: 152 mmol/L — AB (ref 135–145)

## 2017-02-15 LAB — MAGNESIUM: Magnesium: 2.6 mg/dL — ABNORMAL HIGH (ref 1.7–2.4)

## 2017-02-15 LAB — PROCALCITONIN: Procalcitonin: <0.1 ng/mL

## 2017-02-15 LAB — PHOSPHORUS: PHOSPHORUS: 3.4 mg/dL (ref 2.5–4.6)

## 2017-02-15 MED ORDER — IOPAMIDOL (ISOVUE-300) INJECTION 61%
INTRAVENOUS | Status: AC
  Filled 2017-02-15: qty 30

## 2017-02-15 MED ORDER — DILTIAZEM 12 MG/ML ORAL SUSPENSION
60.0000 mg | Freq: Four times a day (QID) | ORAL | Status: DC
  Administered 2017-02-15 – 2017-02-16 (×5): 60 mg
  Filled 2017-02-15 (×7): qty 6

## 2017-02-15 MED ORDER — ACETYLCYSTEINE 20 % IN SOLN
2.0000 mL | Freq: Two times a day (BID) | RESPIRATORY_TRACT | Status: DC
  Administered 2017-02-15: 2 mL via RESPIRATORY_TRACT
  Filled 2017-02-15 (×5): qty 4

## 2017-02-15 MED ORDER — IOPAMIDOL (ISOVUE-300) INJECTION 61%: 30.0000 mL | Freq: Once | INTRAVENOUS | Status: DC | PRN

## 2017-02-15 MED ORDER — FREE WATER: 50.0000 mL | Status: DC

## 2017-02-15 MED ORDER — INSULIN GLARGINE 100 UNIT/ML ~~LOC~~ SOLN
25.0000 [IU] | Freq: Every day | SUBCUTANEOUS | Status: DC
  Administered 2017-02-15: 25 [IU] via SUBCUTANEOUS
  Filled 2017-02-15 (×2): qty 0.25

## 2017-02-15 MED ORDER — IPRATROPIUM BROMIDE 0.02 % IN SOLN
0.5000 mg | Freq: Four times a day (QID) | RESPIRATORY_TRACT | Status: DC
  Administered 2017-02-15 – 2017-02-16 (×5): 0.5 mg via RESPIRATORY_TRACT
  Filled 2017-02-15 (×5): qty 2.5

## 2017-02-15 MED ORDER — GUAIFENESIN 100 MG/5ML PO SOLN
10.0000 mL | ORAL | Status: DC
  Administered 2017-02-15 – 2017-02-27 (×69): 200 mg
  Filled 2017-02-15: qty 20
  Filled 2017-02-15 (×2): qty 10
  Filled 2017-02-15: qty 20
  Filled 2017-02-15 (×6): qty 10
  Filled 2017-02-15: qty 20
  Filled 2017-02-15 (×7): qty 10
  Filled 2017-02-15: qty 20
  Filled 2017-02-15 (×4): qty 10
  Filled 2017-02-15: qty 20
  Filled 2017-02-15 (×3): qty 10
  Filled 2017-02-15: qty 20
  Filled 2017-02-15: qty 10
  Filled 2017-02-15: qty 20
  Filled 2017-02-15 (×11): qty 10
  Filled 2017-02-15: qty 20
  Filled 2017-02-15 (×12): qty 10
  Filled 2017-02-15: qty 20
  Filled 2017-02-15 (×3): qty 10
  Filled 2017-02-15: qty 20
  Filled 2017-02-15 (×4): qty 10

## 2017-02-15 MED ORDER — FREE WATER
200.0000 mL | Status: DC
  Administered 2017-02-15 – 2017-02-16 (×7): 200 mL

## 2017-02-15 MED ORDER — LEVALBUTEROL HCL 0.63 MG/3ML IN NEBU
0.6300 mg | INHALATION_SOLUTION | Freq: Four times a day (QID) | RESPIRATORY_TRACT | Status: DC
  Administered 2017-02-15 – 2017-02-16 (×5): 0.63 mg via RESPIRATORY_TRACT
  Filled 2017-02-15 (×5): qty 3

## 2017-02-15 MED ORDER — DEXTROSE 5 % IV SOLN
INTRAVENOUS | Status: DC
  Administered 2017-02-15: 18:00:00 via INTRAVENOUS

## 2017-02-15 MED ORDER — FREE WATER
100.0000 mL | Status: DC
  Administered 2017-02-15 (×2): 100 mL

## 2017-02-15 MED ORDER — IOPAMIDOL (ISOVUE-300) INJECTION 61%
INTRAVENOUS | Status: AC
  Administered 2017-02-15: 100 mL
  Filled 2017-02-15: qty 100

## 2017-02-15 NOTE — Progress Notes (Signed)
Sodium worsening on repeat labs. Increase Free water from 100cc q4hrs to 200cc q2hrs. Start D5W @ 50cc/hr. Recheck Na at 10pm.

## 2017-02-15 NOTE — Consult Note (Signed)
[facility] Surgery Consult Note  Stuart Mendoza 12-27-1955  [id number].    Requesting MD: Merrie Roof Chief Complaint/Reason for Consult: PEG  HPI:  Stuart Mendoza is a 61yo male PMH afib (xarelto), COPD, HTN, DM, and ETOH abuse who was admitted to [facility] 10/6 with AMS and found to have large right parietal ICH with IVH and 1.7 cm midline shift with hydrocephalus. Intubated immediately for airway, and given Kcentra. Since admission he has developed HCAP for which he completed a 9 day antibiotic course starting 10/8. He underwent tracheostomy on 10/12. Unfortunately the patient began developing fevers again associated with tachypnea and purulent secretions from trach therefore he was pan-cultured and started on Zosyn 10/16 for suspected pneumonia vs tracheobronchitis. Due to patient requiring prolonged intubation surgery has been consulted for PEG tube. He is currently tolerating Vital 1.5 at 22m/hr via Cortrak.   ROS: unable to assess due to patient's mental status ROS   History reviewed. No pertinent family history.  Past Medical History:  Diagnosis Date  . Atrial fibrillation ([place]   . COPD (chronic obstructive pulmonary disease) ([place]   . Diabetes mellitus without complication ([place]   . Hypertension   . Hypokalemia   . Hyponatremia   . Stroke ([place] 02/04/2017   [facility]    Past Surgical History:  Procedure Laterality Date  . APPENDECTOMY    . TONSILLECTOMY      Social History:  reports that he has been smoking.  He has never used smokeless tobacco. He reports that he drinks alcohol. He reports that he does not use drugs.  Allergies:  Allergies  Allergen Reactions  . Metformin And Related     Medications Prior to Admission  Medication Sig Dispense Refill  . albuterol (PROVENTIL HFA;VENTOLIN HFA) 108 (90 Base) MCG/ACT inhaler Inhale 2 puffs into the lungs every 4 (four) hours as needed for wheezing.    .Marland KitchenamLODipine (NORVASC) 10 MG tablet Take 10 mg by mouth  daily.  3  . aspirin EC 81 MG tablet Take 81 mg by mouth daily.    . chlorhexidine (PERIDEX) 0.12 % solution 15 mLs by Mouth Rinse route 2 (two) times daily. 120 mL 0  . fluticasone (FLONASE) 50 MCG/ACT nasal spray Place 2 sprays into both nostrils daily.  5  . folic acid (FOLVITE) 1 MG tablet Take 1 tablet (1 mg total) by mouth daily.    .Marland KitchenHYDROcodone-acetaminophen (NORCO) 10-325 MG tablet Take 1 tablet by mouth 4 (four) times daily. 10 tablet 0  . JANUVIA 100 MG tablet Take 100 mg by mouth daily.  3  . labetalol (NORMODYNE) 100 MG tablet Take 1 tablet (100 mg total) by mouth 2 (two) times daily.    .Marland Kitchenlisinopril (PRINIVIL,ZESTRIL) 20 MG tablet Take 20 mg by mouth daily.  3  . Multiple Vitamin (MULTIVITAMIN WITH MINERALS) TABS tablet Take 1 tablet by mouth daily.    . nicotine (NICODERM CQ - DOSED IN MG/24 HOURS) 14 mg/24hr patch Place 1 patch (14 mg total) onto the skin daily. 28 patch 0  . omeprazole (PRILOSEC) 20 MG capsule Take 20 mg by mouth daily.   3  . SPIRIVA HANDIHALER 18 MCG inhalation capsule Place 18 mcg into inhaler and inhale daily.  3  . XARELTO 20 MG TABS tablet Take 20 mg by mouth daily.  3  . [DISCONTINUED] polyvinyl alcohol (LIQUIFILM TEARS) 1.4 % ophthalmic solution Place 1 drop into both eyes as needed for dry eyes. (Patient not taking: Reported on 02/04/2017) 15 mL  0    Prior to Admission medications   Medication Sig Start Date End Date Taking? Authorizing Provider  albuterol (PROVENTIL HFA;VENTOLIN HFA) 108 (90 Base) MCG/ACT inhaler Inhale 2 puffs into the lungs every 4 (four) hours as needed for wheezing. 03/22/13  Yes [provider]  amLODipine (NORVASC) 10 MG tablet Take 10 mg by mouth daily. 12/22/16  Yes [provider]  aspirin EC 81 MG tablet Take 81 mg by mouth daily.   Yes [provider]  chlorhexidine (PERIDEX) 0.12 % solution 15 mLs by Mouth Rinse route 2 (two) times daily. 02/02/17  Yes Rama, Venetia Maxon, MD  fluticasone (FLONASE)  50 MCG/ACT nasal spray Place 2 sprays into both nostrils daily. 01/17/17  Yes [provider]  folic acid (FOLVITE) 1 MG tablet Take 1 tablet (1 mg total) by mouth daily. 02/03/17  Yes Rama, Venetia Maxon, MD  HYDROcodone-acetaminophen (NORCO) 10-325 MG tablet Take 1 tablet by mouth 4 (four) times daily. 02/02/17  Yes Rama, Venetia Maxon, MD  JANUVIA 100 MG tablet Take 100 mg by mouth daily. 12/23/16  Yes [provider]  labetalol (NORMODYNE) 100 MG tablet Take 1 tablet (100 mg total) by mouth 2 (two) times daily. 02/02/17  Yes Rama, Venetia Maxon, MD  lisinopril (PRINIVIL,ZESTRIL) 20 MG tablet Take 20 mg by mouth daily. 12/22/16  Yes [provider]  Multiple Vitamin (MULTIVITAMIN WITH MINERALS) TABS tablet Take 1 tablet by mouth daily. 02/03/17  Yes Rama, Venetia Maxon, MD  nicotine (NICODERM CQ - DOSED IN MG/24 HOURS) 14 mg/24hr patch Place 1 patch (14 mg total) onto the skin daily. 02/03/17  Yes Rama, Venetia Maxon, MD  omeprazole (PRILOSEC) 20 MG capsule Take 20 mg by mouth daily.  12/22/16  Yes [provider]  SPIRIVA HANDIHALER 18 MCG inhalation capsule Place 18 mcg into inhaler and inhale daily. 12/23/16  Yes [provider]  XARELTO 20 MG TABS tablet Take 20 mg by mouth daily. 12/26/16  Yes [provider]    Blood pressure 138/81, pulse (!) 111, temperature 98.8 F (37.1 C), temperature source Axillary, resp. rate (!) 24, height 5' 10"  (1.778 m), weight 199 lb 11.8 oz (90.6 kg), SpO2 100 %. Physical Exam: General: ill-appearing, WD/WN white male who is laying in bed in NAD HEENT: head is normocephalic, atraumatic.  Sclera are noninjected.  Pupils equal and round.  Mouth is pink and moist. Dentition fair. Cortrak left nare, Trach on vent with thick tan secretions Heart: irregularly irregular. Tachycardic.  No obvious murmurs, gallops, or rubs noted.  Palpable pedal pulses bilaterally Lungs: few rhonchi bilaterally, no wheezes or rales noted.  Respiratory  effort nonlabored on vent Abd: well healed midline and multiple lap incisions, soft, NT/ND, +BS, no masses, hernias, or organomegaly.  MS: all 4 extremities are symmetrical with no cyanosis, clubbing, or edema. Skin: warm and dry with no masses, lesions, or rashes Psych: unable to assess Neuro: does not open eyes or respond to verbal ques  Results for orders placed or performed during the hospital encounter of 02/04/17 (from the past 48 hour(s))  Comprehensive metabolic panel     Status: Abnormal   Collection Time: 02/13/17  2:19 PM  Result Value Ref Range   Sodium 148 (H) 135 - 145 mmol/L   Potassium 2.8 (L) 3.5 - 5.1 mmol/L    Comment: DELTA CHECK NOTED   Chloride 113 (H) 101 - 111 mmol/L   CO2 29 22 - 32 mmol/L   Glucose, Bld 164 (H) 65 -  99 mg/dL   BUN 18 6 - 20 mg/dL   Creatinine, Ser 0.75 0.61 - 1.24 mg/dL   Calcium 8.6 (L) 8.9 - 10.3 mg/dL   Total Protein 6.0 (L) 6.5 - 8.1 g/dL   Albumin 2.3 (L) 3.5 - 5.0 g/dL   AST 52 (H) 15 - 41 U/L   ALT 70 (H) 17 - 63 U/L   Alkaline Phosphatase 181 (H) 38 - 126 U/L   Total Bilirubin 0.5 0.3 - 1.2 mg/dL   GFR calc non Af Amer >60 >60 mL/min   GFR calc Af Amer >60 >60 mL/min    Comment: (NOTE) The eGFR has been calculated using the CKD EPI equation. This calculation has not been validated in all clinical situations. eGFR's persistently <60 mL/min signify possible Chronic Kidney Disease.    Anion gap 6 5 - 15  CBC with Differential/Platelet     Status: Abnormal   Collection Time: 02/13/17  2:19 PM  Result Value Ref Range   WBC 12.8 (H) 4.0 - 10.5 K/uL   RBC 3.50 (L) 4.22 - 5.81 MIL/uL   Hemoglobin 9.2 (L) 13.0 - 17.0 g/dL   HCT 31.4 (L) 39.0 - 52.0 %   MCV 89.7 78.0 - 100.0 fL   MCH 26.3 26.0 - 34.0 pg   MCHC 29.3 (L) 30.0 - 36.0 g/dL   RDW 16.3 (H) 11.5 - 15.5 %   Platelets 298 150 - 400 K/uL   Neutrophils Relative % 84 %   Neutro Abs 10.8 (H) 1.7 - 7.7 K/uL   Lymphocytes Relative 7 %   Lymphs Abs 0.9 0.7 - 4.0 K/uL    Monocytes Relative 9 %   Monocytes Absolute 1.1 (H) 0.1 - 1.0 K/uL   Eosinophils Relative 0 %   Eosinophils Absolute 0.0 0.0 - 0.7 K/uL   Basophils Relative 0 %   Basophils Absolute 0.0 0.0 - 0.1 K/uL  Magnesium     Status: None   Collection Time: 02/13/17  2:19 PM  Result Value Ref Range   Magnesium 2.2 1.7 - 2.4 mg/dL  Phosphorus     Status: None   Collection Time: 02/13/17  2:19 PM  Result Value Ref Range   Phosphorus 2.8 2.5 - 4.6 mg/dL  Culture, respiratory (NON-Expectorated)     Status: None (Preliminary result)   Collection Time: 02/13/17  2:35 PM  Result Value Ref Range   Specimen Description TRACHEAL ASPIRATE    Special Requests Normal    Gram Stain      MODERATE WBC PRESENT,BOTH PMN AND MONONUCLEAR FEW GRAM POSITIVE COCCI IN PAIRS RARE GRAM NEGATIVE RODS    Culture CULTURE REINCUBATED FOR BETTER GROWTH    Report Status PENDING   Glucose, capillary     Status: Abnormal   Collection Time: 02/13/17  3:11 PM  Result Value Ref Range   Glucose-Capillary 159 (H) 65 - 99 mg/dL   Comment 1 Notify RN    Comment 2 Document in Chart   I-STAT 3, arterial blood gas (G3+)     Status: Abnormal   Collection Time: 02/13/17  3:18 PM  Result Value Ref Range   pH, Arterial 7.520 (H) 7.350 - 7.450   pCO2 arterial 36.3 32.0 - 48.0 mmHg   pO2, Arterial 123.0 (H) 83.0 - 108.0 mmHg   Bicarbonate 29.4 (H) 20.0 - 28.0 mmol/L   TCO2 30 22 - 32 mmol/L   O2 Saturation 99.0 %   Acid-Base Excess 6.0 (H) 0.0 - 2.0 mmol/L   Patient temperature  100.6 F    Collection site RADIAL, ALLEN'S TEST ACCEPTABLE    Drawn by Operator    Sample type ARTERIAL   Glucose, capillary     Status: Abnormal   Collection Time: 02/13/17  7:30 PM  Result Value Ref Range   Glucose-Capillary 176 (H) 65 - 99 mg/dL  Glucose, capillary     Status: Abnormal   Collection Time: 02/13/17 11:15 PM  Result Value Ref Range   Glucose-Capillary 218 (H) 65 - 99 mg/dL  Glucose, capillary     Status: Abnormal   Collection  Time: 02/14/17  3:21 AM  Result Value Ref Range   Glucose-Capillary 174 (H) 65 - 99 mg/dL  Glucose, capillary     Status: Abnormal   Collection Time: 02/14/17  7:38 AM  Result Value Ref Range   Glucose-Capillary 203 (H) 65 - 99 mg/dL   Comment 1 Notify RN    Comment 2 Document in Chart   Blood gas, arterial     Status: Abnormal   Collection Time: 02/14/17  9:10 AM  Result Value Ref Range   FIO2 40.00    Delivery systems VENTILATOR    Mode PRESSURE REGULATED VOLUME CONTROL    VT 620 mL   LHR 19 resp/min   Peep/cpap 5.0 cm H20   pH, Arterial 7.534 (H) 7.350 - 7.450   pCO2 arterial 32.0 32.0 - 48.0 mmHg   pO2, Arterial 124 (H) 83.0 - 108.0 mmHg   Bicarbonate 26.9 20.0 - 28.0 mmol/L   Acid-Base Excess 4.1 (H) 0.0 - 2.0 mmol/L   O2 Saturation 99.0 %   Patient temperature 98.6    Collection site RIGHT RADIAL    Drawn by [phone]    Sample type ARTERIAL DRAW    Allens test (pass/fail) PASS PASS  Basic metabolic panel     Status: Abnormal   Collection Time: 02/14/17 10:00 AM  Result Value Ref Range   Sodium 136 135 - 145 mmol/L    Comment: QUESTIONABLE RESULTS, RECOMMEND RECOLLECT TO VERIFY NOTIFIED HUTTON,T RN @ 1134 02/14/17 LEONARD,A    Potassium 3.7 3.5 - 5.1 mmol/L    Comment: QUESTIONABLE RESULTS, RECOMMEND RECOLLECT TO VERIFY NOTIFIED HUTTON,T RN @ 1134 02/14/17 LEONARD,A    Chloride 104 101 - 111 mmol/L    Comment: QUESTIONABLE RESULTS, RECOMMEND RECOLLECT TO VERIFY NOTIFIED HUTTON,T RN @ 4193 02/14/17 LEONARD,A    CO2 24 22 - 32 mmol/L    Comment: QUESTIONABLE RESULTS, RECOMMEND RECOLLECT TO VERIFY NOTIFIED HUTTON,T RN @ 7902 02/14/17 LEONARD,A    Glucose, Bld 90 65 - 99 mg/dL    Comment: QUESTIONABLE RESULTS, RECOMMEND RECOLLECT TO VERIFY NOTIFIED HUTTON,T RN @ 4097 02/14/17 LEONARD,A    BUN <5 (L) 6 - 20 mg/dL    Comment: RESULT REPEATED AND VERIFIED QUESTIONABLE RESULTS, RECOMMEND RECOLLECT TO VERIFY NOTIFIED HUTTON,T RN @ 3532 02/14/17 LEONARD,A     Creatinine, Ser 0.56 (L) 0.61 - 1.24 mg/dL    Comment: QUESTIONABLE RESULTS, RECOMMEND RECOLLECT TO VERIFY NOTIFIED HUTTON,T RN @ 9924 02/14/17 LEONARD,A    Calcium 9.2 8.9 - 10.3 mg/dL    Comment: QUESTIONABLE RESULTS, RECOMMEND RECOLLECT TO VERIFY NOTIFIED HUTTON,T RN @ 2683 02/14/17 LEONARD,A    GFR calc non Af Amer >60 >60 mL/min   GFR calc Af Amer >60 >60 mL/min    Comment: (NOTE) The eGFR has been calculated using the CKD EPI equation. This calculation has not been validated in all clinical situations. eGFR's persistently <60 mL/min signify possible Chronic Kidney Disease. CORRECTED ON  10/16 AT 1133: PREVIOUSLY REPORTED AS >60    Anion gap 8 5 - 15    Comment: QUESTIONABLE RESULTS, RECOMMEND RECOLLECT TO VERIFY NOTIFIED HUTTON,T RN @ 5974 02/14/17 LEONARD,A   Magnesium     Status: None   Collection Time: 02/14/17 10:00 AM  Result Value Ref Range   Magnesium 1.7 1.7 - 2.4 mg/dL    Comment: QUESTIONABLE RESULTS, RECOMMEND RECOLLECT TO VERIFY NOTIFIED HUTTON,T RN @ 1638 02/14/17 LEONARD,A   Phosphorus     Status: Abnormal   Collection Time: 02/14/17 10:00 AM  Result Value Ref Range   Phosphorus 4.7 (H) 2.5 - 4.6 mg/dL    Comment: QUESTIONABLE RESULTS, RECOMMEND RECOLLECT TO VERIFY NOTIFIED HUTTON,T RN @ 4536 02/14/17 LEONARD,A   CBC with Differential/Platelet     Status: Abnormal   Collection Time: 02/14/17 10:00 AM  Result Value Ref Range   WBC  4.0 - 10.5 K/uL    QUESTIONABLE RESULTS, RECOMMEND RECOLLECT TO VERIFY    Comment: NOTIFIED HUTTON,T RN @ 4680 02/14/17 LEONARD,A   RBC 2.72 (L) 4.22 - 5.81 MIL/uL    Comment: QUESTIONABLE RESULTS, RECOMMEND RECOLLECT TO VERIFY NOTIFIED HUTTON,T RN @ 3212 02/14/17 LEONARD,A    Hemoglobin 8.4 (L) 13.0 - 17.0 g/dL    Comment: REPEATED TO VERIFY QUESTIONABLE RESULTS, RECOMMEND RECOLLECT TO VERIFY NOTIFIED HUTTON,T RN @ 2482 02/14/17 LEONARD,A    HCT 25.2 (L) 39.0 - 52.0 %    Comment: QUESTIONABLE RESULTS, RECOMMEND  RECOLLECT TO VERIFY NOTIFIED HUTTON,T RN @ 5003 02/14/17 LEONARD,A    MCV 92.6 78.0 - 100.0 fL    Comment: QUESTIONABLE RESULTS, RECOMMEND RECOLLECT TO VERIFY NOTIFIED HUTTON,T RN @ 7048 02/14/17 LEONARD,A    MCH 30.9 26.0 - 34.0 pg    Comment: QUESTIONABLE RESULTS, RECOMMEND RECOLLECT TO VERIFY NOTIFIED HUTTON,T RN @ 8891 02/14/17 LEONARD,A    MCHC 33.3 30.0 - 36.0 g/dL    Comment: QUESTIONABLE RESULTS, RECOMMEND RECOLLECT TO VERIFY NOTIFIED HUTTON,T RN @ 6945 02/14/17 LEONARD,A    RDW 23.4 (H) 11.5 - 15.5 %    Comment: REPEATED TO VERIFY QUESTIONABLE RESULTS, RECOMMEND RECOLLECT TO VERIFY NOTIFIED HUTTON,T RN @ 1134 02/14/17 LEONARD,A    Platelets 328 150 - 400 K/uL    Comment: QUESTIONABLE RESULTS, RECOMMEND RECOLLECT TO VERIFY NOTIFIED HUTTON,T RN @ 0388 02/14/17 LEONARD,A    Neutrophils Relative %  %    QUESTIONABLE RESULTS, RECOMMEND RECOLLECT TO VERIFY    Comment: NOTIFIED HUTTON,T RN @ 8280 02/14/17 LEONARD,A   Neutro Abs  1.7 - 7.7 K/uL    QUESTIONABLE RESULTS, RECOMMEND RECOLLECT TO VERIFY    Comment: NOTIFIED HUTTON,T RN @ 0349 02/14/17 LEONARD,A   Band Neutrophils  %    QUESTIONABLE RESULTS, RECOMMEND RECOLLECT TO VERIFY    Comment: NOTIFIED HUTTON,T RN @ 1791 02/14/17 LEONARD,A   Lymphocytes Relative  %    QUESTIONABLE RESULTS, RECOMMEND RECOLLECT TO VERIFY    Comment: NOTIFIED HUTTON,T RN @ 5056 02/14/17 LEONARD,A   Lymphs Abs  0.7 - 4.0 K/uL    QUESTIONABLE RESULTS, RECOMMEND RECOLLECT TO VERIFY    Comment: NOTIFIED HUTTON,T RN @ 9794 02/14/17 LEONARD,A   Monocytes Relative  %    QUESTIONABLE RESULTS, RECOMMEND RECOLLECT TO VERIFY    Comment: NOTIFIED HUTTON,T RN @ 8016 02/14/17 LEONARD,A   Monocytes Absolute  0.1 - 1.0 K/uL    QUESTIONABLE RESULTS, RECOMMEND RECOLLECT TO VERIFY    Comment: NOTIFIED HUTTON,T RN @ 5537 02/14/17 LEONARD,A   Eosinophils Relative  %    QUESTIONABLE RESULTS, RECOMMEND  RECOLLECT TO VERIFY    Comment: NOTIFIED HUTTON,T RN @ 1134  02/14/17 LEONARD,A   Eosinophils Absolute  0.0 - 0.7 K/uL    QUESTIONABLE RESULTS, RECOMMEND RECOLLECT TO VERIFY    Comment: NOTIFIED HUTTON,T RN @ 0017 02/14/17 LEONARD,A   Basophils Relative  %    QUESTIONABLE RESULTS, RECOMMEND RECOLLECT TO VERIFY    Comment: NOTIFIED HUTTON,T RN @ 4944 02/14/17 LEONARD,A   Basophils Absolute  0.0 - 0.1 K/uL    QUESTIONABLE RESULTS, RECOMMEND RECOLLECT TO VERIFY    Comment: NOTIFIED HUTTON,T RN @ 9675 02/14/17 LEONARD,A   WBC Morphology      QUESTIONABLE RESULTS, RECOMMEND RECOLLECT TO VERIFY    Comment: NOTIFIED HUTTON,T RN @ 9163 02/14/17 LEONARD,A   RBC Morphology      QUESTIONABLE RESULTS, RECOMMEND RECOLLECT TO VERIFY    Comment: NOTIFIED HUTTON,T RN @ 1134 02/14/17 LEONARD,A   Smear Review      QUESTIONABLE RESULTS, RECOMMEND RECOLLECT TO VERIFY    Comment: NOTIFIED HUTTON,T RN @ 1134 02/14/17 LEONARD,A   nRBC  0 /100 WBC    QUESTIONABLE RESULTS, RECOMMEND RECOLLECT TO VERIFY    Comment: NOTIFIED HUTTON,T RN @ 1134 02/14/17 LEONARD,A   Metamyelocytes Relative  %    QUESTIONABLE RESULTS, RECOMMEND RECOLLECT TO VERIFY    Comment: NOTIFIED HUTTON,T RN @ 1134 02/14/17 LEONARD,A   Myelocytes  %    QUESTIONABLE RESULTS, RECOMMEND RECOLLECT TO VERIFY    Comment: NOTIFIED HUTTON,T RN @ 1134 02/14/17 LEONARD,A   Promyelocytes Absolute  %    QUESTIONABLE RESULTS, RECOMMEND RECOLLECT TO VERIFY    Comment: NOTIFIED HUTTON,T RN @ 1134 02/14/17 LEONARD,A   Blasts  %    QUESTIONABLE RESULTS, RECOMMEND RECOLLECT TO VERIFY    Comment: NOTIFIED HUTTON,T RN @ 1134 02/14/17 LEONARD,A  Urinalysis, Routine w reflex microscopic     Status: Abnormal   Collection Time: 02/14/17 10:15 AM  Result Value Ref Range   Color, Urine YELLOW YELLOW   APPearance HAZY (A) CLEAR   Specific Gravity, Urine 1.022 1.005 - 1.030   pH 5.0 5.0 - 8.0   Glucose, UA >=500 (A) NEGATIVE mg/dL   Hgb urine dipstick NEGATIVE NEGATIVE   Bilirubin Urine NEGATIVE NEGATIVE   Ketones, ur  NEGATIVE NEGATIVE mg/dL   Protein, ur NEGATIVE NEGATIVE mg/dL   Nitrite NEGATIVE NEGATIVE   Leukocytes, UA NEGATIVE NEGATIVE   RBC / HPF 0-5 0 - 5 RBC/hpf   WBC, UA 0-5 0 - 5 WBC/hpf   Bacteria, UA NONE SEEN NONE SEEN   Squamous Epithelial / LPF NONE SEEN NONE SEEN  Glucose, capillary     Status: Abnormal   Collection Time: 02/14/17 11:56 AM  Result Value Ref Range   Glucose-Capillary 244 (H) 65 - 99 mg/dL  Glucose, capillary     Status: Abnormal   Collection Time: 02/14/17  3:58 PM  Result Value Ref Range   Glucose-Capillary 217 (H) 65 - 99 mg/dL  Lactic acid, plasma     Status: None   Collection Time: 02/14/17  4:17 PM  Result Value Ref Range   Lactic Acid, Venous 1.5 0.5 - 1.9 mmol/L  Basic metabolic panel     Status: Abnormal   Collection Time: 02/14/17  4:17 PM  Result Value Ref Range   Sodium 150 (H) 135 - 145 mmol/L    Comment: DELTA CHECK NOTED   Potassium 3.4 (L) 3.5 - 5.1 mmol/L   Chloride 116 (H) 101 - 111 mmol/L   CO2 28 22 - 32  mmol/L   Glucose, Bld 228 (H) 65 - 99 mg/dL   BUN 30 (H) 6 - 20 mg/dL   Creatinine, Ser 0.80 0.61 - 1.24 mg/dL   Calcium 8.9 8.9 - 10.3 mg/dL   GFR calc non Af Amer >60 >60 mL/min   GFR calc Af Amer >60 >60 mL/min    Comment: (NOTE) The eGFR has been calculated using the CKD EPI equation. This calculation has not been validated in all clinical situations. eGFR's persistently <60 mL/min signify possible Chronic Kidney Disease.    Anion gap 6 5 - 15  CBC with Differential/Platelet     Status: Abnormal   Collection Time: 02/14/17  4:17 PM  Result Value Ref Range   WBC 16.6 (H) 4.0 - 10.5 K/uL   RBC 3.67 (L) 4.22 - 5.81 MIL/uL   Hemoglobin 10.0 (L) 13.0 - 17.0 g/dL   HCT 33.2 (L) 39.0 - 52.0 %   MCV 90.5 78.0 - 100.0 fL   MCH 27.2 26.0 - 34.0 pg   MCHC 30.1 30.0 - 36.0 g/dL   RDW 16.5 (H) 11.5 - 15.5 %   Platelets 318 150 - 400 K/uL   Neutrophils Relative % 87 %   Neutro Abs 14.5 (H) 1.7 - 7.7 K/uL   Lymphocytes Relative 5 %    Lymphs Abs 0.9 0.7 - 4.0 K/uL   Monocytes Relative 8 %   Monocytes Absolute 1.3 (H) 0.1 - 1.0 K/uL   Eosinophils Relative 0 %   Eosinophils Absolute 0.0 0.0 - 0.7 K/uL   Basophils Relative 0 %   Basophils Absolute 0.0 0.0 - 0.1 K/uL  Magnesium     Status: None   Collection Time: 02/14/17  4:17 PM  Result Value Ref Range   Magnesium 2.4 1.7 - 2.4 mg/dL  Procalcitonin     Status: None   Collection Time: 02/14/17  4:17 PM  Result Value Ref Range   Procalcitonin <0.10 ng/mL    Comment:        Interpretation: PCT (Procalcitonin) <= 0.5 ng/mL: Systemic infection (sepsis) is not likely. Local bacterial infection is possible. (NOTE)         ICU PCT Algorithm               Non ICU PCT Algorithm    ----------------------------     ------------------------------         PCT < 0.25 ng/mL                 PCT < 0.1 ng/mL     Stopping of antibiotics            Stopping of antibiotics       strongly encouraged.               strongly encouraged.    ----------------------------     ------------------------------       PCT level decrease by               PCT < 0.25 ng/mL       >= 80% from peak PCT       OR PCT 0.25 - 0.5 ng/mL          Stopping of antibiotics                                             encouraged.     Stopping of  antibiotics           encouraged.    ----------------------------     ------------------------------       PCT level decrease by              PCT >= 0.25 ng/mL       < 80% from peak PCT        AND PCT >= 0.5 ng/mL            Continuin g antibiotics                                              encouraged.       Continuing antibiotics            encouraged.    ----------------------------     ------------------------------     PCT level increase compared          PCT > 0.5 ng/mL         with peak PCT AND          PCT >= 0.5 ng/mL             Escalation of antibiotics                                          strongly encouraged.      Escalation of antibiotics         strongly encouraged.   Phosphorus     Status: None   Collection Time: 02/14/17  4:17 PM  Result Value Ref Range   Phosphorus 3.0 2.5 - 4.6 mg/dL  Glucose, capillary     Status: Abnormal   Collection Time: 02/14/17  7:42 PM  Result Value Ref Range   Glucose-Capillary 203 (H) 65 - 99 mg/dL  Glucose, capillary     Status: Abnormal   Collection Time: 02/14/17 11:08 PM  Result Value Ref Range   Glucose-Capillary 213 (H) 65 - 99 mg/dL  Basic metabolic panel     Status: Abnormal   Collection Time: 02/15/17  2:58 AM  Result Value Ref Range   Sodium 152 (H) 135 - 145 mmol/L   Potassium 3.8 3.5 - 5.1 mmol/L   Chloride 119 (H) 101 - 111 mmol/L   CO2 26 22 - 32 mmol/L   Glucose, Bld 170 (H) 65 - 99 mg/dL   BUN 32 (H) 6 - 20 mg/dL   Creatinine, Ser 0.95 0.61 - 1.24 mg/dL   Calcium 8.9 8.9 - 10.3 mg/dL   GFR calc non Af Amer >60 >60 mL/min   GFR calc Af Amer >60 >60 mL/min    Comment: (NOTE) The eGFR has been calculated using the CKD EPI equation. This calculation has not been validated in all clinical situations. eGFR's persistently <60 mL/min signify possible Chronic Kidney Disease.    Anion gap 7 5 - 15  CBC with Differential/Platelet     Status: Abnormal   Collection Time: 02/15/17  2:58 AM  Result Value Ref Range   WBC 12.1 (H) 4.0 - 10.5 K/uL   RBC 3.48 (L) 4.22 - 5.81 MIL/uL   Hemoglobin 9.2 (L) 13.0 - 17.0 g/dL   HCT 31.6 (L) 39.0 - 52.0 %   MCV 90.8 78.0 - 100.0 fL   MCH 26.4 26.0 -  34.0 pg   MCHC 29.1 (L) 30.0 - 36.0 g/dL   RDW 17.1 (H) 11.5 - 15.5 %   Platelets 327 150 - 400 K/uL   Neutrophils Relative % 83 %   Lymphocytes Relative 9 %   Monocytes Relative 8 %   Eosinophils Relative 0 %   Basophils Relative 0 %   Neutro Abs 10.0 (H) 1.7 - 7.7 K/uL   Lymphs Abs 1.1 0.7 - 4.0 K/uL   Monocytes Absolute 1.0 0.1 - 1.0 K/uL   Eosinophils Absolute 0.0 0.0 - 0.7 K/uL   Basophils Absolute 0.0 0.0 - 0.1 K/uL   RBC Morphology POLYCHROMASIA PRESENT   Magnesium     Status:  Abnormal   Collection Time: 02/15/17  2:58 AM  Result Value Ref Range   Magnesium 2.6 (H) 1.7 - 2.4 mg/dL  Phosphorus     Status: None   Collection Time: 02/15/17  2:58 AM  Result Value Ref Range   Phosphorus 3.4 2.5 - 4.6 mg/dL  Procalcitonin     Status: None   Collection Time: 02/15/17  2:58 AM  Result Value Ref Range   Procalcitonin <0.10 ng/mL    Comment:        Interpretation: PCT (Procalcitonin) <= 0.5 ng/mL: Systemic infection (sepsis) is not likely. Local bacterial infection is possible. (NOTE)         ICU PCT Algorithm               Non ICU PCT Algorithm    ----------------------------     ------------------------------         PCT < 0.25 ng/mL                 PCT < 0.1 ng/mL     Stopping of antibiotics            Stopping of antibiotics       strongly encouraged.               strongly encouraged.    ----------------------------     ------------------------------       PCT level decrease by               PCT < 0.25 ng/mL       >= 80% from peak PCT       OR PCT 0.25 - 0.5 ng/mL          Stopping of antibiotics                                             encouraged.     Stopping of antibiotics           encouraged.    ----------------------------     ------------------------------       PCT level decrease by              PCT >= 0.25 ng/mL       < 80% from peak PCT        AND PCT >= 0.5 ng/mL            Continuin g antibiotics                                              encouraged.  Continuing antibiotics            encouraged.    ----------------------------     ------------------------------     PCT level increase compared          PCT > 0.5 ng/mL         with peak PCT AND          PCT >= 0.5 ng/mL             Escalation of antibiotics                                          strongly encouraged.      Escalation of antibiotics        strongly encouraged.   Glucose, capillary     Status: Abnormal   Collection Time: 02/15/17  3:05 AM  Result Value Ref  Range   Glucose-Capillary 164 (H) 65 - 99 mg/dL  Blood gas, arterial     Status: Abnormal   Collection Time: 02/15/17  4:10 AM  Result Value Ref Range   FIO2 40.00    Delivery systems VENTILATOR    Mode PRESSURE REGULATED VOLUME CONTROL    VT 620 mL   LHR 14 resp/min   Peep/cpap 5.0 cm H20   pH, Arterial 7.493 (H) 7.350 - 7.450   pCO2 arterial 36.3 32.0 - 48.0 mmHg   pO2, Arterial 122 (H) 83.0 - 108.0 mmHg   Bicarbonate 27.4 20.0 - 28.0 mmol/L   Acid-Base Excess 4.2 (H) 0.0 - 2.0 mmol/L   O2 Saturation 98.5 %   Patient temperature 99.5    Collection site RIGHT RADIAL    Drawn by [phone]    Sample type ARTERIAL DRAW    Allens test (pass/fail) PASS PASS  Glucose, capillary     Status: Abnormal   Collection Time: 02/15/17  7:51 AM  Result Value Ref Range   Glucose-Capillary 216 (H) 65 - 99 mg/dL   Comment 1 Notify RN    Comment 2 Document in Chart   Glucose, capillary     Status: Abnormal   Collection Time: 02/15/17 12:00 PM  Result Value Ref Range   Glucose-Capillary 230 (H) 65 - 99 mg/dL   Dg Chest Port 1 View  Result Date: 02/15/2017 CLINICAL DATA:  Hypoxia, respiratory failure. History of hypertension, stroke, COPD and atrial fibrillation. EXAM: PORTABLE CHEST 1 VIEW COMPARISON:  02/14/2017; 02/13/2017; 02/11/2027 FINDINGS: Grossly unchanged enlarged cardiac silhouette and mediastinal contours. Atherosclerotic plaque when the thoracic aorta. Stable positioning of support apparatus. Lungs remain hyperexpanded with flattening of the diaphragms. Left basilar heterogeneous / consolidative opacities are unchanged. No new focal airspace opacities. No pleural effusion or pneumothorax. Post sideplate fixation several left-sided ribs. IMPRESSION: 1.  Stable positioning of support apparatus.  No pneumothorax. 2. Similar findings of lung hyperexpansion and left basilar opacities, atelectasis versus infiltrate. Electronically Signed   By: Sandi Mariscal M.D.   On: 02/15/2017 07:47   Dg Chest  Port 1 View  Result Date: 02/14/2017 CLINICAL DATA:  Tracheostomy EXAM: PORTABLE CHEST 1 VIEW COMPARISON:  02/13/2017 FINDINGS: Cardiac shadow is enlarged but stable. Postsurgical changes in left chest wall are noted. A tracheostomy tube and feeding catheter are again noted and stable. Elevation of left hemidiaphragm with left basilar atelectasis is seen. No other focal abnormality is noted. IMPRESSION: Left basilar atelectasis new from the prior exam. Electronically Signed  By: Inez Catalina M.D.   On: 02/14/2017 16:39   Dg Chest Port 1 View  Result Date: 02/13/2017 CLINICAL DATA:  Tachypnea. EXAM: PORTABLE CHEST 1 VIEW COMPARISON:  02/10/2017 FINDINGS: Tracheostomy tube in good position. Feeding tube tip below the diaphragm. Left-sided central line has been removed since the prior exam. Chronic cardiomegaly. Pulmonary vascularity is normal. Lungs are now clear. No effusions. No acute bone abnormality. IMPRESSION: Resolution of atelectasis at the lung bases. The lungs are now clear. Chronic mild cardiomegaly. Electronically Signed   By: Lorriane Shire M.D.   On: 02/13/2017 15:12   Anti-infectives    Start     Dose/Rate Route Frequency Ordered Stop   02/14/17 0900  piperacillin-tazobactam (ZOSYN) IVPB 3.375 g     3.375 g 12.5 mL/hr over 240 Minutes Intravenous Every 8 hours 02/14/17 0830     02/06/17 1000  cefTRIAXone (ROCEPHIN) 1 g in dextrose 5 % 50 mL IVPB     1 g 100 mL/hr over 30 Minutes Intravenous Every 24 hours 02/06/17 0924 02/12/17 0946   02/05/17 0200  vancomycin (VANCOCIN) IVPB 1000 mg/200 mL premix  Status:  Discontinued     1,000 mg 200 mL/hr over 60 Minutes Intravenous Every 12 hours 02/04/17 1308 02/05/17 1135   02/04/17 2000  piperacillin-tazobactam (ZOSYN) IVPB 3.375 g  Status:  Discontinued     3.375 g 12.5 mL/hr over 240 Minutes Intravenous Every 8 hours 02/04/17 1310 02/06/17 0924   02/04/17 1400  piperacillin-tazobactam (ZOSYN) IVPB 3.375 g  Status:  Discontinued      3.375 g 12.5 mL/hr over 240 Minutes Intravenous Every 8 hours 02/04/17 1221 02/04/17 1310   02/04/17 1015  vancomycin (VANCOCIN) 2,000 mg in sodium chloride 0.9 % 500 mL IVPB     2,000 mg 250 mL/hr over 120 Minutes Intravenous  Once 02/04/17 1009 02/04/17 1227   02/04/17 1000  piperacillin-tazobactam (ZOSYN) IVPB 3.375 g     3.375 g 100 mL/hr over 30 Minutes Intravenous  Once 02/04/17 0957 02/04/17 1055   02/04/17 1000  vancomycin (VANCOCIN) IVPB 1000 mg/200 mL premix  Status:  Discontinued     1,000 mg 200 mL/hr over 60 Minutes Intravenous  Once 02/04/17 0957 02/04/17 1009        Assessment/Plan H/o ETOH abuse HTN Atrial fibrillation DM COPD Anemia of chronic disease Acute hypoxic respiratory failure HCAP pneumonia vs Tracheobronchitis - on zosyn, cultures pending  ICH, IVH Prolonged vent wean, prolonged NPO status  ID - Vancomycin 10/6 >>10/8, Zosyn 10/6 >> 10/7; 10/16 >>, CTX 10/8>>> 10/14 VTE - heparin FEN - NPO, TF via NG  Plan - Continue resuscitation and treatment for suspected HCAP vs tracheobronchitis. Once patient more stable will plan to proceed with PEG tube placement, possibly this Friday with Dr. Hulen Skains.  Per chart patient's prior abdominal surgical history includes an appendectomy. On exam he has a large well healed midline incision and multiple laparoscopic incisions. Will order CT abdomen/pelvis.  Wellington Hampshire, [facility] 02/15/2017, 2:16 PM Pager: [phone] Consults: [phone] Mon-Fri 7:00 am-4:30 pm Sat-Sun 7:00 am-11:30 am

## 2017-02-15 NOTE — Progress Notes (Signed)
PULMONARY / CRITICAL CARE MEDICINE   Name: Stuart Mendoza MRN: [id number] DOB: [birth year]    ADMISSION DATE:  02/04/2017  REFERRING MD:  Dr. Sheria Lang, ER  CHIEF COMPLAINT:  Altered mental status  HISTORY OF PRESENT ILLNESS:   61yo M with Afib (on xarelto), COPD, HTN, DM, and Etoh abuse, presented to ER on 10/6 with AMS and found to have large right parietal ICH with IVH and 1.7 cm midline shift with hydrocephalus. Intubated for airway, and given Kcentra.  Found to have RUL HCAP pneumonia. Had recent admission (9/29 to 10/04) for alcohol withdrawal and right humerus fracture (12/2016).   SUBJECTIVE:  10/12: s/p Tracheostomy 10/13: Tolerated ATC All day 10/14: Went back on vent 10/14 PM due to dyspnea.  10/15: on TC trial has RR 35-40 and thick purulent secretions from Trach. 10/16: febrile, continues to have thick purulent secretions via trach. Has intermittent cuff leak from trach for which trach was exchanged.  10/17: increased tachycardia, thick tan secretions, tmax 98.8, failed SBT secondary to rr in 40's, per RT- multiple mucous plugs requiring bag/lavage  VITAL SIGNS: BP 97/64   Pulse (!) 131   Temp 98.8 F (37.1 C) (Axillary)   Resp (!) 34   Ht 5\' 10"  (1.778 m)   Wt 199 lb 11.8 oz (90.6 kg)   SpO2 96%   BMI 28.66 kg/m   VENTILATOR SETTINGS: Vent Mode: PRVC FiO2 (%):  [40 %] 40 % Set Rate:  [14 bmp] 14 bmp Vt Set:  [620 mL] 620 mL PEEP:  [5 cmH20] 5 cmH20 Plateau Pressure:  [12 cmH20-36 cmH20] 36 cmH20  INTAKE / OUTPUT: I/O last 3 completed shifts: In: 2870 [I.V.:10; NG/GT:2310; IV Piggyback:550] Out: 2125 [Urine:2125]  PHYSICAL EXAMINATION: General: ill- appearing man lying in bed on MV HEENT: MM pink/moist, cortrak left nare, 6 xlt trach midline- thick tan secretions, pupils 3/=/reactive Neuro: does not open eyes, grimaces/ minimally withdrawals in extremities CV: IRIR PULM: even/non-labored on MV,  lungs bilaterally clear, diminished in left base GI: soft,  non-tender, bs active  Extremities: warm/dry, RUE swelling, no BLE edema  Skin: no rashes, healed abrasions to right knee  LABS:  BMET  Recent Labs Lab 02/14/17 1000 02/14/17 1617 02/15/17 0258  NA 136 150* 152*  K 3.7 3.4* 3.8  CL 104 116* 119*  CO2 24 28 26   BUN <5* 30* 32*  CREATININE 0.56* 0.80 0.95  GLUCOSE 90 228* 170*    Electrolytes  Recent Labs Lab 02/14/17 1000 02/14/17 1617 02/15/17 0258  CALCIUM 9.2 8.9 8.9  MG 1.7 2.4 2.6*  PHOS 4.7* 3.0 3.4    CBC  Recent Labs Lab 02/14/17 1000 02/14/17 1617 02/15/17 0258  WBC QUESTIONABLE RESULTS, RECOMMEND RECOLLECT TO VERIFY 16.6* 12.1*  HGB 8.4* 10.0* 9.2*  HCT 25.2* 33.2* 31.6*  PLT 328 318 327    Coag's No results for input(s): APTT, INR in the last 168 hours.  Sepsis Markers  Recent Labs Lab 02/14/17 1617  LATICACIDVEN 1.5  PROCALCITON <0.10    ABG  Recent Labs Lab 02/13/17 1518 02/14/17 0910 02/15/17 0410  PHART 7.520* 7.534* 7.493*  PCO2ART 36.3 32.0 36.3  PO2ART 123.0* 124* 122*    Liver Enzymes  Recent Labs Lab 02/13/17 1419  AST 52*  ALT 70*  ALKPHOS 181*  BILITOT 0.5  ALBUMIN 2.3*    Cardiac Enzymes No results for input(s): TROPONINI, PROBNP in the last 168 hours.  Glucose  Recent Labs Lab 02/14/17 1156 02/14/17 1558 02/14/17 1942 02/14/17 2308 02/15/17  0305 02/15/17 0751  GLUCAP 244* 217* 203* 213* 164* 216*    Imaging Dg Chest Port 1 View  Result Date: 02/15/2017 CLINICAL DATA:  Hypoxia, respiratory failure. History of hypertension, stroke, COPD and atrial fibrillation. EXAM: PORTABLE CHEST 1 VIEW COMPARISON:  02/14/2017; 02/13/2017; 02/11/2027 FINDINGS: Grossly unchanged enlarged cardiac silhouette and mediastinal contours. Atherosclerotic plaque when the thoracic aorta. Stable positioning of support apparatus. Lungs remain hyperexpanded with flattening of the diaphragms. Left basilar heterogeneous / consolidative opacities are unchanged. No new focal  airspace opacities. No pleural effusion or pneumothorax. Post sideplate fixation several left-sided ribs. IMPRESSION: 1.  Stable positioning of support apparatus.  No pneumothorax. 2. Similar findings of lung hyperexpansion and left basilar opacities, atelectasis versus infiltrate. Electronically Signed   By: Simonne Come M.D.   On: 02/15/2017 07:47   Dg Chest Port 1 View  Result Date: 02/14/2017 CLINICAL DATA:  Tracheostomy EXAM: PORTABLE CHEST 1 VIEW COMPARISON:  02/13/2017 FINDINGS: Cardiac shadow is enlarged but stable. Postsurgical changes in left chest wall are noted. A tracheostomy tube and feeding catheter are again noted and stable. Elevation of left hemidiaphragm with left basilar atelectasis is seen. No other focal abnormality is noted. IMPRESSION: Left basilar atelectasis new from the prior exam. Electronically Signed   By: Alcide Clever M.D.   On: 02/14/2017 16:39   STUDIES:  CT head 10/6 >> Large ICH Rt parietal lobe.  1.7 cm of midline shift.  Small amount of hemorrhage in the Lt frontal lobe and a small amount of subdural blood. CT head 10/9>>>unchanged  CULTURES: Blood 10/6 >> 2/4 Coag neg staph, likely MRSE based on [facility] ID Urine 10/6 >> less than 10k colonies insignificant growth Sputum 10/6 >> culture order was cancelled Tracheal asp 10/15 >> Blood 10/16 >>  ANTIBIOTICS: Vancomycin 10/6 >>10/8 Zosyn 10/6 >> 10/7; 10/16 >> CTX 10/8>>> 10/14  SIGNIFICANT EVENTS: 10/6 Admit, neurology, neurosurgery consulted, kcentra, 3% NS 10/12 remains with reasonable neurostatus 10/15: minimally responsive which sounds worse than prior baseline last week based on EMR notes.  10/16: fever, pan-cultured, cuff leak so trach exchanged for #6 distal XLT; restart zosyn  LINES/TUBES: ETT 10/06 >> 10/12 Trach #6 Shiley (DF) 10/12 >> 10/16 Trach #6 Distal XLT 10/16 >> L [place] TLC 10/6 >> 10/15  ASSESSMENT / PLAN: 61yo M with Afib (on xarelto), COPD, HTN, DM, and Etoh abuse, admit 10/6 with  large right parietal ICH with IVH and 1.7 cm midline shift with hydrocephalus. Acute Hypoxic Respiratory failure with HCAP pneumonia. Had recent admission (9/29 to 10/04) for alcohol withdrawal and right humerus fracture (12/2016).   NEURO 1. ICH, IVH - 1.7cm midline shift on CT Head 10/6, unchanged on 10/9 - PT/OT - has NG tube; will likely need a PEG  2. Hx ETOH abuse:  - continue Thiamine, folic acid, multivitamin  CARDIOVASCULAR 1. Hx Hypertension: - continue metoprolol, lisinopril  2. Hx Afib: - off therapeutic anticoag given ICH - increase dilt 60mg  PO q6hrs in addition to Metoprolol 50mg  BID - electrolyte repletion PRN  PULM 1. Acute Hypoxic respiratory failure; HCAP pneumonia vs tracheobronchitis - s/p perc trach 10/12; was doing TC trials until developed tachypnea and fever on 10/15-16 w/purulent secretions form trach. Febrile again on 10/16.   - completed 9 day antibiotic 10/8. Pan-cultured and started Zosyn 10/16.  - follow sputum culture (10/15): few GPC's in pairs, rare GNR's.  - reassuring lactate 1.5 and procalcitonin < 0.10 - continue full vent support and daily SBT- failed 10/17, goal is ATC  -  CXR 10/17- Left basilar atelectasis vs inflitrate, improved from 10/16 - start chest PTand add guaifenesin   2. Hx COPD: - continue albuterol nebs   INFECTIOUS DISEASE 1. Fever; Question new Pneumonia vs Tracheobronchitis: - see plan above.   ENDOCRINE: 1. DM type II: remains hyperglycemic - Continue sliding scale insulin- moderate - increase Lantus 25u daily  RENAL: 1. Hypokalemia: 2. Hypernatremia: - Trend Bmet/ mag/ phos - replace electrolytes as needed - restart free water flushes - Monitor UOP; avoid nephrotoxic agents  HEME: 1. Anemia of critical illness and chronic disease: - had been stable   MUSCULOSKELETAL: 1. Right Humerus fracture: - diagnosed 01/27/17 during a prior admission; occurred prior to admission when patient fell when walking his dog.   - had been maintained in a sling with plan for ortho followup. Now is not moving his right arm so sling is not needed at this time.  - monitor right arm swelling.   GI PPI Tube feeding at goal   Independent CC time 35 minutes  Posey BoyerBrooke Simpson, AGACNP-BC Deputy Pulmonary & Critical Care Pgr: [phone] or if no answer [phone] 02/15/2017, 8:28 AM

## 2017-02-15 NOTE — Progress Notes (Signed)
Transported PT to CT   Pt back in room

## 2017-02-15 NOTE — Progress Notes (Signed)
CPT in progress patient is tolerating it well.

## 2017-02-16 ENCOUNTER — Inpatient Hospital Stay (HOSPITAL_COMMUNITY): Payer: Medicare Other

## 2017-02-16 LAB — GLUCOSE, CAPILLARY
GLUCOSE-CAPILLARY: 234 mg/dL — AB (ref 65–99)
Glucose-Capillary: 197 mg/dL — ABNORMAL HIGH (ref 65–99)
Glucose-Capillary: 217 mg/dL — ABNORMAL HIGH (ref 65–99)
Glucose-Capillary: 174 mg/dL — ABNORMAL HIGH (ref 65–99)
Glucose-Capillary: 135 mg/dL — ABNORMAL HIGH (ref 65–99)

## 2017-02-16 LAB — CBC WITH DIFFERENTIAL/PLATELET
Basophils Absolute: 0 10*3/uL (ref 0.0–0.1)
Basophils Relative: 0 %
Eosinophils Absolute: 0.1 10*3/uL (ref 0.0–0.7)
Eosinophils Relative: 1 %
HEMATOCRIT: 30 % — AB (ref 39.0–52.0)
HEMOGLOBIN: 8.7 g/dL — AB (ref 13.0–17.0)
LYMPHS ABS: 1.1 10*3/uL (ref 0.7–4.0)
LYMPHS PCT: 10 %
MCH: 26.4 pg (ref 26.0–34.0)
MCHC: 29 g/dL — AB (ref 30.0–36.0)
MCV: 90.9 fL (ref 78.0–100.0)
MONOS PCT: 5 %
Monocytes Absolute: 0.6 10*3/uL (ref 0.1–1.0)
NEUTROS ABS: 9.5 10*3/uL — AB (ref 1.7–7.7)
NEUTROS PCT: 84 %
Platelets: 314 10*3/uL (ref 150–400)
RBC: 3.3 MIL/uL — ABNORMAL LOW (ref 4.22–5.81)
RDW: 16.6 % — ABNORMAL HIGH (ref 11.5–15.5)
WBC: 11.3 10*3/uL — ABNORMAL HIGH (ref 4.0–10.5)

## 2017-02-16 LAB — MAGNESIUM: Magnesium: 2.3 mg/dL (ref 1.7–2.4)

## 2017-02-16 LAB — CULTURE, RESPIRATORY

## 2017-02-16 LAB — BASIC METABOLIC PANEL
ANION GAP: 8 (ref 5–15)
BUN: 25 mg/dL — ABNORMAL HIGH (ref 6–20)
CHLORIDE: 113 mmol/L — AB (ref 101–111)
CO2: 25 mmol/L (ref 22–32)
CREATININE: 0.82 mg/dL (ref 0.61–1.24)
Calcium: 8.9 mg/dL (ref 8.9–10.3)
GFR calc non Af Amer: >60 mL/min (ref >60)
Glucose, Bld: 237 mg/dL — ABNORMAL HIGH (ref 65–99)
POTASSIUM: 3.1 mmol/L — AB (ref 3.5–5.1)
Sodium: 146 mmol/L — ABNORMAL HIGH (ref 135–145)

## 2017-02-16 LAB — PHOSPHORUS: Phosphorus: 3.7 mg/dL (ref 2.5–4.6)

## 2017-02-16 LAB — POCT I-STAT 3, ART BLOOD GAS (G3+)
Acid-Base Excess: 2 mmol/L (ref 0.0–2.0)
Bicarbonate: 25.3 mmol/L (ref 20.0–28.0)
O2 Saturation: 96 %
Patient temperature: 98.6
TCO2: 26 mmol/L (ref 22–32)
pCO2 arterial: 34.7 mmHg (ref 32.0–48.0)
pH, Arterial: 7.47 — ABNORMAL HIGH (ref 7.350–7.450)
pO2, Arterial: 79 mmHg — ABNORMAL LOW (ref 83.0–108.0)

## 2017-02-16 LAB — BLOOD GAS, ARTERIAL
ACID-BASE EXCESS: 2.7 mmol/L — AB (ref 0.0–2.0)
BICARBONATE: 25.9 mmol/L (ref 20.0–28.0)
Drawn by: 44166
FIO2: 40
LHR: 14 {breaths}/min
O2 Saturation: 98.6 %
PATIENT TEMPERATURE: 98.6
PCO2 ART: 34.7 mmHg (ref 32.0–48.0)
PEEP/CPAP: 5 cmH2O
PO2 ART: 119 mmHg — AB (ref 83.0–108.0)
VT: 620 mL
pH, Arterial: 7.486 — ABNORMAL HIGH (ref 7.350–7.450)

## 2017-02-16 LAB — CULTURE, RESPIRATORY W GRAM STAIN: Special Requests: NORMAL

## 2017-02-16 LAB — PROCALCITONIN: Procalcitonin: <0.1 ng/mL

## 2017-02-16 LAB — SODIUM: Sodium: 145 mmol/L (ref 135–145)

## 2017-02-16 MED ORDER — PRO-STAT SUGAR FREE PO LIQD
30.0000 mL | Freq: Two times a day (BID) | ORAL | Status: DC
  Administered 2017-02-16 – 2017-02-26 (×19): 30 mL
  Filled 2017-02-16 (×19): qty 30

## 2017-02-16 MED ORDER — POTASSIUM CHLORIDE 10 MEQ/100ML IV SOLN
10.0000 meq | INTRAVENOUS | Status: AC
  Administered 2017-02-16 (×3): 10 meq via INTRAVENOUS
  Filled 2017-02-16 (×3): qty 100

## 2017-02-16 MED ORDER — ACETYLCYSTEINE 20 % IN SOLN
2.0000 mL | Freq: Three times a day (TID) | RESPIRATORY_TRACT | Status: DC
  Administered 2017-02-16 – 2017-02-21 (×15): 2 mL via RESPIRATORY_TRACT
  Administered 2017-02-21: 17:00:00 via RESPIRATORY_TRACT
  Administered 2017-02-21 – 2017-02-25 (×13): 2 mL via RESPIRATORY_TRACT
  Administered 2017-02-26: 08:00:00 via RESPIRATORY_TRACT
  Filled 2017-02-16 (×30): qty 4

## 2017-02-16 MED ORDER — DILTIAZEM HCL 100 MG IV SOLR
5.0000 mg/h | INTRAVENOUS | Status: DC
  Administered 2017-02-16 – 2017-02-17 (×2): 5 mg/h via INTRAVENOUS
  Administered 2017-02-18: 10 mg/h via INTRAVENOUS
  Filled 2017-02-16 (×5): qty 100

## 2017-02-16 MED ORDER — GLUCERNA 1.2 CAL PO LIQD
1000.0000 mL | ORAL | Status: DC
  Administered 2017-02-16 – 2017-02-26 (×10): 1000 mL
  Filled 2017-02-16 (×22): qty 1000

## 2017-02-16 MED ORDER — IPRATROPIUM BROMIDE 0.02 % IN SOLN
0.5000 mg | RESPIRATORY_TRACT | Status: DC
  Administered 2017-02-16 – 2017-02-19 (×18): 0.5 mg via RESPIRATORY_TRACT
  Filled 2017-02-16 (×19): qty 2.5

## 2017-02-16 MED ORDER — INSULIN GLARGINE 100 UNIT/ML ~~LOC~~ SOLN
15.0000 [IU] | Freq: Two times a day (BID) | SUBCUTANEOUS | Status: DC
  Administered 2017-02-16: 15 [IU] via SUBCUTANEOUS
  Filled 2017-02-16: qty 0.15

## 2017-02-16 MED ORDER — DILTIAZEM LOAD VIA INFUSION
10.0000 mg | Freq: Once | INTRAVENOUS | Status: AC
  Administered 2017-02-16: 10 mg via INTRAVENOUS
  Filled 2017-02-16: qty 10

## 2017-02-16 MED ORDER — INSULIN GLARGINE 100 UNIT/ML ~~LOC~~ SOLN
10.0000 [IU] | Freq: Every day | SUBCUTANEOUS | Status: AC
  Administered 2017-02-16: 10 [IU] via SUBCUTANEOUS
  Filled 2017-02-16: qty 0.1

## 2017-02-16 MED ORDER — INSULIN GLARGINE 100 UNIT/ML ~~LOC~~ SOLN
25.0000 [IU] | Freq: Every day | SUBCUTANEOUS | Status: DC
  Administered 2017-02-17 – 2017-02-25 (×9): 25 [IU] via SUBCUTANEOUS
  Filled 2017-02-16 (×10): qty 0.25

## 2017-02-16 MED ORDER — POTASSIUM CHLORIDE 20 MEQ/15ML (10%) PO SOLN
40.0000 meq | Freq: Once | ORAL | Status: AC
Start: 2017-02-16 — End: 2017-02-16
  Administered 2017-02-16: 40 meq
  Filled 2017-02-16: qty 30

## 2017-02-16 MED ORDER — LEVALBUTEROL HCL 0.63 MG/3ML IN NEBU
INHALATION_SOLUTION | RESPIRATORY_TRACT | Status: AC
  Filled 2017-02-16: qty 3

## 2017-02-16 MED ORDER — LEVALBUTEROL HCL 0.63 MG/3ML IN NEBU
0.6300 mg | INHALATION_SOLUTION | RESPIRATORY_TRACT | Status: DC
  Administered 2017-02-16: 17:00:00 via RESPIRATORY_TRACT
  Administered 2017-02-16 – 2017-02-18 (×10): 0.63 mg via RESPIRATORY_TRACT
  Filled 2017-02-16 (×10): qty 3

## 2017-02-16 MED ORDER — FREE WATER
200.0000 mL | Status: DC
  Administered 2017-02-16 (×2): 200 mL

## 2017-02-16 MED ORDER — FREE WATER
200.0000 mL | Status: DC
  Administered 2017-02-16 – 2017-02-18 (×15): 200 mL

## 2017-02-16 NOTE — Progress Notes (Signed)
Called to patient's bedside for patient with increased work of breathing and accessory muscle use. He remains on vent. Pox 99%. Hemodynamically stable but now in Afib with RVR to 130's. RN says he had HR in 150's prior to my arrival. RT suctioned large mucous plug during my exam. Following removal of that plug, patient's RR returned to baseline and his WOB resolved. HR improved to 118. Lungs CTA b/l.  On review of MAR, it appears he only received mucumyst nebs once yesterday and missed his dose this AM. He continues on Xopenex/Atrovent nebs q6hrs. His Chest PT is also in as PRN. I only see one documented chest PT session from 10/17 at 8pm.   Plan: - CXR stat - ABG stat - change Chest PT to scheduled q4hrs - continue Atrovent/Xopenex; will change to q4hrs to coincide with chest PT - continue Mucomyst increase from BID to q8hrs  - for Afib with RVR (had been rate controlled this AM), will start dilt gtt with 10mg  IV bolus

## 2017-02-16 NOTE — Progress Notes (Signed)
PULMONARY / CRITICAL CARE MEDICINE   Name: Stuart Mendoza MRN: [id number] DOB: 07-05-1955    ADMISSION DATE:  02/04/2017  REFERRING MD:  Dr. Sheria Lang, ER  CHIEF COMPLAINT:  Altered mental status  HISTORY OF PRESENT ILLNESS:   61yo M with Afib (on xarelto), COPD, HTN, DM, and Etoh abuse, presented to ER on 10/6 with AMS and found to have large right parietal ICH with IVH and 1.7 cm midline shift with hydrocephalus. Intubated for airway, and given Kcentra.  Found to have RUL HCAP pneumonia. Had recent admission (9/29 to 10/04) for alcohol withdrawal and right humerus fracture (12/2016).   SUBJECTIVE:  61yo M with Afib (on xarelto), COPD, HTN, DM, and Etoh abuse, presented to ER on 10/6 with AMS and found to have large right parietal ICH with IVH and 1.7 cm midline shift with hydrocephalus. Intubated for airway, and given Kcentra.  Found to have RUL HCAP pneumonia. Had recent admission (9/29 to 10/04) for alcohol withdrawal and right humerus fracture (12/2016).   SUBJECTIVE:  10/12: s/p Tracheostomy 10/13: Tolerated ATC All day 10/14: Went back on vent 10/14 PM due to dyspnea.  10/15: on TC trial has RR 35-40 and thick purulent secretions from Trach. 10/16: febrile, continues to have thick purulent secretions via trach. Has intermittent cuff leak from trach for which trach was exchanged.  10/17: increased tachycardia, thick tan secretions, tmax 98.8, failed SBT secondary to rr in 40's, per RT- multiple mucous plugs requiring bag/lavage 10/18:  HR better controlled, tmax 102.1 but improving, Na improving with free water, following commands on right  VITAL SIGNS: BP 129/76   Pulse (!) 49   Temp 98.2 F (36.8 C) (Axillary)   Resp 17   Ht 5\' 10"  (1.778 m)   Wt 203 lb 7.8 oz (92.3 kg)   SpO2 99%   BMI 29.20 kg/m   VENTILATOR SETTINGS: Vent Mode: PRVC FiO2 (%):  [40 %] 40 % Set Rate:  [14 bmp] 14 bmp Vt Set:  [620 mL] 620 mL PEEP:  [5 cmH20] 5 cmH20 Plateau Pressure:  [14 cmH20-23 cmH20] 18 cmH20  INTAKE / OUTPUT: I/O last 3 completed shifts: In: 5586.7 [I.V.:646.7; NG/GT:4690; IV Piggyback:250] Out: 2325 [Urine:2325]  PHYSICAL EXAMINATION: General:  Chronically ill appearing male lying in bed in NAD HEENT: MM pink/moist, cortrak left nare, 6 xlt trach midline- copious thick tan secretions, pupils 3/=/ reactive Neuro: attempts to open eyes R> L,  purposeful movements on R, follows commands, left hemiplegia CV: IRIR  PULM: even/non-labored on MV, lungs bilaterally coarse faint exp wheeze GI: obese, soft, non-tender, bs active  Extremities: warm/dry, no BLE edema, some RUE swelling Skin: no rashes, scabbed abrasions to right knee   LABS:  BMET  Recent Labs Lab 02/14/17 1617 02/15/17 0258 02/15/17 1451 02/15/17 2204 02/16/17 0319  NA 150* 152* 154* 149* 146*  K 3.4* 3.8  --   --  3.1*  CL 116* 119*  --   --  113*  CO2 28 26  --   --  25  BUN 30* 32*  --   --  25*  CREATININE 0.80 0.95  --   --  0.82  GLUCOSE 228* 170*  --   --  237*    Electrolytes  Recent Labs Lab 02/14/17 1617 02/15/17 0258 02/16/17 0319  CALCIUM 8.9 8.9 8.9  MG 2.4 2.6* 2.3  PHOS 3.0 3.4 3.7    CBC  Recent Labs Lab 02/14/17 1617 02/15/17 0258 02/16/17 0319  WBC 16.6* 12.1* 11.3*  HGB 10.0* 9.2* 8.7*  HCT 33.2* 31.6* 30.0*  PLT 318 327 314    Coag's No results for input(s): APTT, INR in the last 168 hours.  Sepsis Markers  Recent Labs Lab 02/14/17 1617 02/15/17 0258 02/16/17 0319  LATICACIDVEN 1.5  --   --   PROCALCITON <0.10 <0.10 <0.10    ABG  Recent Labs Lab 02/14/17 0910  02/15/17 0410 02/16/17 0407  PHART 7.534* 7.493* 7.486*  PCO2ART 32.0 36.3 34.7  PO2ART 124* 122* 119*    Liver Enzymes  Recent Labs Lab 02/13/17 1419  AST 52*  ALT 70*  ALKPHOS 181*  BILITOT 0.5  ALBUMIN 2.3*    Cardiac Enzymes No results for input(s): TROPONINI, PROBNP in the last 168 hours.  Glucose  Recent Labs Lab 02/15/17 0751 02/15/17 1200 02/15/17 1519 02/15/17 1929 02/15/17 2305 02/16/17 0312  GLUCAP 216* 230* 192* 109* 221* 197*    Imaging No results found. STUDIES:  CT head 10/6 >> Large ICH Rt parietal lobe.  1.7 cm of midline shift.  Small amount of hemorrhage in the Lt frontal lobe and a small amount of subdural blood. CT head 10/9>>>unchanged CT abd/pelvis 10/17 >>  CULTURES: Blood 10/6 >> 2/4  Coag neg staph, likely MRSE based on [facility] ID Urine 10/6 >> less than 10k colonies insignificant growth Sputum 10/6 >> culture order was cancelled Tracheal asp 10/15 >> Blood 10/16 >>  ANTIBIOTICS: Vancomycin 10/6 >>10/8 Zosyn 10/6 >> 10/7; 10/16 >> CTX 10/8>>> 10/14  SIGNIFICANT EVENTS: 10/6 Admit, neurology, neurosurgery consulted, kcentra, 3% NS 10/12 remains with reasonable neurostatus 10/15: minimally responsive which sounds worse than prior baseline last week based on EMR notes.  10/16: fever, pan-cultured, cuff leak so trach exchanged for #6 distal XLT; restart zosyn  LINES/TUBES: ETT 10/06 >> 10/12 Trach #6 Shiley (DF) 10/12 >> 10/16 Trach #6 Distal XLT 10/16 >> L  TLC 10/6 >> 10/15  ASSESSMENT / PLAN: 61yo M with Afib (on xarelto), COPD, HTN, DM, and Etoh abuse, admit 10/6 with large right parietal ICH with IVH and 1.7 cm midline shift with hydrocephalus. Acute Hypoxic Respiratory failure with HCAP pneumonia. Had recent admission (9/29 to 10/04) for alcohol withdrawal and right humerus fracture (12/2016).   NEURO 1. ICH, IVH - 1.7cm midline shift on CT Head 10/6, unchanged on 10/9 - PT/OT - CCS will place PEG possibly 10/19  2. Hx ETOH abuse:  - continue Thiamine, folic acid, multivitamin  CARDIOVASCULAR 1. Hx Hypertension: - continue metoprolol, lisinopril  2. Hx Afib: - off therapeutic anticoag given ICH - continue dilt 60mg  PO q6hrs w/ Metoprolol 50mg  BID, HR better controlled 10/18; fever may be a contributing factor as well - electrolyte repletion PRN  PULM 1. Acute Hypoxic respiratory failure; HCAP pneumonia vs tracheobronchitis - s/p perc trach 10/12; was doing TC trials until developed tachypnea and fever on 10/15-16 w/purulent secretions form trach. Febrile again on 10/16.   - completed 9 day antibiotic 10/8. Pan-cultured and started Zosyn 10/16.  - follow sputum culture (10/15): few GPC's in pairs, rare GNR's.  - reassuring procalcitonin < 0.10  (10/18) - continue full vent support and daily SBT attempt goal is eventually ATC  - CXR in am - continue chest PT, guaifenesin, and mucomyst nebs for mucus plugs  2. Hx COPD: - continue albuterol nebs - changed to xopenex 10/17 to help with HR  INFECTIOUS DISEASE 1. Fever; Question new Pneumonia vs Tracheobronchitis: - see plan above.  - await cultures, narrow zosyn as able - tylenol prn fever  ENDOCRINE: 1. DM type II: remains hyperglycemic - Continue sliding scale insulin- moderate - increase Lantus 25u daily to 15u BID  RENAL: 1. Hypokalemia: 2. Hypernatremia:- improving - KCL replete 10/18 - decrease free water to q 4 - Trend Bmet/ mag/ phos - replace electrolytes as needed - Monitor UOP; avoid nephrotoxic agents  HEME: 1. Anemia of critical illness and  chronic disease: - had been stable   MUSCULOSKELETAL: 1. Right Humerus fracture: - diagnosed 01/27/17 during a prior admission; occurred prior to admission when patient fell when walking his dog.  - had been maintained in a sling with plan for ortho followup. Now is not moving his right arm so sling is not needed at this time.  - monitor right arm swelling.   GI PPI Tube feeding at goal  - CT abd/pelvis pending - CCS following for PEG placement, possibly 10/19  Global: per CSW notes 10/12, [facility].  Pt's contact Dtr, Margreta JourneyAngela Forton # [phone] or Brother, Corey SkainsJoe Falkner.  No family at bedside 10/17 or 10/18.  Independent CC time 35 minutes  Posey BoyerBrooke Codi Folkerts, AGACNP-BC [place] [facility] Pgr: [phone] or if no answer [phone] 02/16/2017, 7:40 AM

## 2017-02-16 NOTE — Progress Notes (Signed)
CM Referral noted for LTAC.  Pt has [employer] Medicare, which requires 21 days on the ventilator before they will consider LTAC.  Pt has only been in hospital 12 days as of 10/18.  Will continue follow for LTAC eligibility and make referrals when appropriate.    Quintella BatonJulie W. Marne Meline, RN, BSN  Trauma/Neuro ICU Case Manager [phone]

## 2017-02-16 NOTE — Progress Notes (Signed)
Nutrition Follow-up  DOCUMENTATION CODES:   Obesity unspecified  INTERVENTION:   D/C Vital 1.5  Glucerna 1.2 @ 65 ml/hr (1560 ml/day) 30 ml Prostat BID Provides: 2072 (97% of needs), 123 grams protein, and 1263 ml free water.    NUTRITION DIAGNOSIS:   Inadequate oral intake related to inability to eat as evidenced by NPO status. Ongoing.  GOAL:   Provide needs based on ASPEN/SCCM guidelines Met.   MONITOR:   Vent status, Labs, Weight trends, TF tolerance  ASSESSMENT:   61 yo male smoker brought to ER with altered mental status.  Found to have larged Rt parietal ICH with IVH and 1.7 cm midline shift with hydrocephalus.  Has hx of a fib on xarelto.  Intubated for airway, and given Kcentra.  Had recent admission (9/29 to 10/04) for alcohol withdrawal.  PMHx COPD, DM, HTN.  Pt discussed during ICU rounds and with RN.  10/12 trach and Cortrak placed Plan for PEG 10/19  Patient is currently intubated on ventilator support MV: 10.5 L/min Temp (24hrs), Avg:99.5 F (37.5 C), Min:98.2 F (36.8 C), Max:102.1 F (38.9 C)  Medications reviewed and include: colace, folvite, lantus (15 units BID), MVI, thiamine, 10 mEq KCl three runs today Free water 200 ml every 4 hours Labs reviewed: Na 152 (H), magnesium 2.6 (H) CBG's: [phone] Weight stable   TF:Vital 1.5 @goal rate of 60ml/hr Prostat 30ml daily  Regimen provides 2260kcal/day, 112g/day protein, 1100ml/day free water    Diet Order:  Diet NPO time specified  Skin:   (non pressure wound buttocks)  Last BM:  10/18 large  Height:   Ht Readings from Last 1 Encounters:  02/07/17 5' 10" (1.778 m)    Weight:   Wt Readings from Last 1 Encounters:  02/16/17 203 lb 7.8 oz (92.3 kg)    Ideal Body Weight:  75.4 kg  BMI:  Body mass index is 29.2 kg/m.  Estimated Nutritional Needs:   Kcal:  2123  Protein:  110-125 grams  Fluid:  >2L/day   EDUCATION NEEDS:   Education needs no appropriate at this  time  Heather Pitts RD, LDN, CNSC [phone] Pager [phone] After Hours Pager  

## 2017-02-16 NOTE — Progress Notes (Signed)
Pt work of breathing increased with HR and BP elevated. Suctioned patient but ventilator still alarming and pt work of breathing did not get better.  Rt and CCM called.  New orders received.  Will continue to monitor.

## 2017-02-17 ENCOUNTER — Inpatient Hospital Stay (HOSPITAL_COMMUNITY): Payer: Medicare Other

## 2017-02-17 ENCOUNTER — Encounter (HOSPITAL_COMMUNITY)
Admission: EM | Disposition: A | Discharge status: Hospice | Payer: Self-pay | Source: Home / Self Care | Attending: Internal Medicine

## 2017-02-17 DIAGNOSIS — J810 Acute pulmonary edema: Secondary | ICD-10-CM

## 2017-02-17 HISTORY — PX: ESOPHAGOGASTRODUODENOSCOPY: SHX5428

## 2017-02-17 HISTORY — PX: PEG PLACEMENT: SHX5437

## 2017-02-17 LAB — BASIC METABOLIC PANEL
ANION GAP: 9 (ref 5–15)
ANION GAP: 9 (ref 5–15)
BUN: 22 mg/dL — ABNORMAL HIGH (ref 6–20)
BUN: 19 mg/dL (ref 6–20)
CO2: 25 mmol/L (ref 22–32)
CO2: 22 mmol/L (ref 22–32)
Calcium: 9 mg/dL (ref 8.9–10.3)
Calcium: 9 mg/dL (ref 8.9–10.3)
Chloride: 111 mmol/L (ref 101–111)
Chloride: 111 mmol/L (ref 101–111)
Creatinine, Ser: 0.73 mg/dL (ref 0.61–1.24)
Creatinine, Ser: 0.77 mg/dL (ref 0.61–1.24)
GFR calc non Af Amer: >60 mL/min (ref >60)
GLUCOSE: 130 mg/dL — AB (ref 65–99)
GLUCOSE: 118 mg/dL — AB (ref 65–99)
POTASSIUM: 3.3 mmol/L — AB (ref 3.5–5.1)
POTASSIUM: 3.8 mmol/L (ref 3.5–5.1)
SODIUM: 142 mmol/L (ref 135–145)
Sodium: 145 mmol/L (ref 135–145)

## 2017-02-17 LAB — GLUCOSE, CAPILLARY
GLUCOSE-CAPILLARY: 118 mg/dL — AB (ref 65–99)
GLUCOSE-CAPILLARY: 119 mg/dL — AB (ref 65–99)
GLUCOSE-CAPILLARY: 155 mg/dL — AB (ref 65–99)
GLUCOSE-CAPILLARY: 167 mg/dL — AB (ref 65–99)
GLUCOSE-CAPILLARY: 187 mg/dL — AB (ref 65–99)
GLUCOSE-CAPILLARY: 99 mg/dL (ref 65–99)
Glucose-Capillary: 130 mg/dL — ABNORMAL HIGH (ref 65–99)

## 2017-02-17 LAB — CBC WITH DIFFERENTIAL/PLATELET
BASOS ABS: 0 10*3/uL (ref 0.0–0.1)
Basophils Relative: 0 %
Eosinophils Absolute: 0.1 10*3/uL (ref 0.0–0.7)
Eosinophils Relative: 1 %
HCT: 30 % — ABNORMAL LOW (ref 39.0–52.0)
HEMOGLOBIN: 8.9 g/dL — AB (ref 13.0–17.0)
LYMPHS PCT: 11 %
Lymphs Abs: 1.1 10*3/uL (ref 0.7–4.0)
MCH: 26.6 pg (ref 26.0–34.0)
MCHC: 29.7 g/dL — ABNORMAL LOW (ref 30.0–36.0)
MCV: 89.6 fL (ref 78.0–100.0)
MONO ABS: 0.6 10*3/uL (ref 0.1–1.0)
MONOS PCT: 5 %
NEUTROS ABS: 8.8 10*3/uL — AB (ref 1.7–7.7)
NEUTROS PCT: 83 %
Platelets: 363 10*3/uL (ref 150–400)
RBC: 3.35 MIL/uL — ABNORMAL LOW (ref 4.22–5.81)
RDW: 16.6 % — AB (ref 11.5–15.5)
WBC: 10.6 10*3/uL — ABNORMAL HIGH (ref 4.0–10.5)

## 2017-02-17 LAB — PHOSPHORUS: PHOSPHORUS: 3.6 mg/dL (ref 2.5–4.6)

## 2017-02-17 LAB — MAGNESIUM: Magnesium: 2.1 mg/dL (ref 1.7–2.4)

## 2017-02-17 SURGERY — EGD (ESOPHAGOGASTRODUODENOSCOPY)
Anesthesia: Moderate Sedation

## 2017-02-17 MED ORDER — FENTANYL CITRATE (PF) 100 MCG/2ML IJ SOLN
100.0000 ug | Freq: Once | INTRAMUSCULAR | Status: AC
  Administered 2017-02-17: 100 ug via INTRAVENOUS

## 2017-02-17 MED ORDER — POTASSIUM CHLORIDE 20 MEQ/15ML (10%) PO SOLN
40.0000 meq | Freq: Every day | ORAL | Status: DC
  Administered 2017-02-18: 40 meq
  Filled 2017-02-17: qty 30

## 2017-02-17 MED ORDER — MIDAZOLAM HCL 2 MG/2ML IJ SOLN
2.0000 mg | Freq: Once | INTRAMUSCULAR | Status: AC
  Administered 2017-02-17: 2 mg via INTRAVENOUS

## 2017-02-17 MED ORDER — FUROSEMIDE 10 MG/ML IJ SOLN
20.0000 mg | Freq: Two times a day (BID) | INTRAMUSCULAR | Status: DC
  Administered 2017-02-17 (×2): 20 mg via INTRAVENOUS
  Filled 2017-02-17 (×2): qty 2

## 2017-02-17 MED ORDER — POTASSIUM CHLORIDE 10 MEQ/100ML IV SOLN: 10.0000 meq | INTRAVENOUS | Status: DC

## 2017-02-17 MED ORDER — POTASSIUM CHLORIDE 20 MEQ/15ML (10%) PO SOLN: 40.0000 meq | Freq: Once | ORAL | Status: DC

## 2017-02-17 MED ORDER — POTASSIUM CHLORIDE 10 MEQ/100ML IV SOLN
10.0000 meq | INTRAVENOUS | Status: AC
  Administered 2017-02-17 (×3): 10 meq via INTRAVENOUS
  Filled 2017-02-17 (×3): qty 100

## 2017-02-17 NOTE — Evaluation (Signed)
Physical Therapy Evaluation Patient Details Name: Stuart Mendoza MRN: [id number] DOB: 06-27-1955 Today's Date: 02/17/2017   History of Present Illness  61yo M with Afib (on xarelto), COPD, HTN, DM, and Etoh abuse, presented to ER on 10/6 with AMS and found to have large right parietal ICH with IVH and 1.7 cm midline shift with hydrocephalus. Intubated for airway protection.  Found to have RUL HCAP pneumonia. Recent admitt after fall and humerus fx  Clinical Impression  Pt lethargic and not opening eyes on arrival. With wet cold cloth on face and stimulation pt noted to have RUE movement. Pt sat EOB 10 min with assist for balance and safety with eyes open not tracking in sitting but not maintained supine. Pt with no noted movement of Left side and decreased strength of right side. Pt with decreased strength, function, cognition, cardiopulmonary function, balance and mobility who will benefit from acute therapy to maximize strength, independence, and balance to decrease burden of care.   BP pre 171/122 Post 119/75 HR 114 Vent 40%    Follow Up Recommendations SNF;Supervision/Assistance - 24 hour    Equipment Recommendations  Hospital bed;Wheelchair (measurements PT);Wheelchair cushion (measurements PT)    Recommendations for Other Services       Precautions / Restrictions Precautions Precaution Comments: trach, peg Restrictions RUE Weight Bearing: Non weight bearing      Mobility  Bed Mobility Overal bed mobility: Needs Assistance Bed Mobility: Rolling;Supine to Sit;Sit to Supine Rolling: Total assist   Supine to sit: Total assist;+2 for physical assistance Sit to supine: Total assist;+2 for physical assistance   General bed mobility comments: total assist with pad to roll. total assist for bil LE movement and trunk control to pivot to EOB and back to bed. Total assist to scoot to [facility] [address]  Transfers                    Ambulation/Gait                Stairs             Wheelchair Mobility    Modified Rankin (Stroke Patients Only) Modified Rankin (Stroke Patients Only) Pre-Morbid Rankin Score: No symptoms Modified Rankin: Severe disability     Balance Overall balance assessment: Needs assistance   Sitting balance-Leahy Scale: Zero Sitting balance - Comments: pt EOB 10 min with max assist for balance. Pt attempting to move RUE to support self at times and with min trunk activation with perturbation                                     Pertinent Vitals/Pain Pain Assessment:  (CPOT= 0)    Home Living Family/patient expects to be discharged to:: Unsure                 Additional Comments: per prior admission pt had a roomate and was independent but even at that time he would not provide home setup or PLOF and now unable    Prior Function Level of Independence: Independent               Hand Dominance        Extremity/Trunk Assessment   Upper Extremity Assessment Upper Extremity Assessment: Generalized weakness;LUE deficits/detail RUE Deficits / Details: pt with limited RUE movement grossly 2-5 LUE Deficits / Details: no painful response or active motion    Lower Extremity Assessment Lower Extremity Assessment: RLE  deficits/detail;LLE deficits/detail RLE Deficits / Details: pt with limited movement of RLE grossly 2-/5 knee flexion, toe movement and 1/5 quad activation, not to command LLE Deficits / Details: no painful response or active motion    Cervical / Trunk Assessment Cervical / Trunk Assessment: Kyphotic (maintains forward head and limited trunk activation in sitting)  Communication   Communication: Tracheostomy  Cognition Arousal/Alertness: Lethargic Behavior During Therapy: Flat affect Overall Cognitive Status: Difficult to assess Area of Impairment: Attention                   Current Attention Level: Focused           General Comments: pt with assist to open eyes,  maintained eyes open EOB only and then back to lethargic in supine.       General Comments      Exercises     Assessment/Plan    PT Assessment Patient needs continued PT services  PT Problem List Decreased strength;Decreased range of motion;Decreased mobility;Decreased activity tolerance;Decreased balance;Decreased knowledge of use of DME;Decreased safety awareness;Decreased coordination;Decreased cognition;Cardiopulmonary status limiting activity;Impaired sensation;Impaired tone       PT Treatment Interventions DME instruction;Therapeutic activities;Cognitive remediation;Therapeutic exercise;Patient/family education;Balance training;Functional mobility training;Neuromuscular re-education    PT Goals (Current goals can be found in the Care Plan section)  Acute Rehab PT Goals PT Goal Formulation: Patient unable to participate in goal setting Time For Goal Achievement: 02/24/17 Potential to Achieve Goals: Fair    Frequency Min 3X/week   Barriers to discharge Decreased caregiver support      Co-evaluation               AM-PAC PT "6 Clicks" Daily Activity  Outcome Measure Difficulty turning over in bed (including adjusting bedclothes, sheets and blankets)?: Unable Difficulty moving from lying on back to sitting on the side of the bed? : Unable Difficulty sitting down on and standing up from a chair with arms (e.g., wheelchair, bedside commode, etc,.)?: Unable Help needed moving to and from a bed to chair (including a wheelchair)?: Total Help needed walking in hospital room?: Total Help needed climbing 3-5 steps with a railing? 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

## 2017-02-17 NOTE — Progress Notes (Signed)
MD placed pt back on full vent support d/t tachypnea.  Also, CPT not done currently d/t pt receiving OT and sitting up in bed w/ OT.

## 2017-02-17 NOTE — Evaluation (Signed)
Occupational Therapy Evaluation Patient Details Name: Stuart Mendoza MRN: [id number] DOB: 1955/11/09 Today's Date: 02/17/2017    History of Present Illness 61yo M with Afib (on xarelto), COPD, HTN, DM, and Etoh abuse, presented to ER on 10/6 with AMS and found to have large right parietal ICH with IVH and 1.7 cm midline shift with hydrocephalus. Intubated for airway protection.  Found to have RUL HCAP pneumonia. Recent admitt after fall and humerus fx   Clinical Impression   Pt with minimal ability to participate in therapy. Lethargic, with minimal eye opening. Demonstrates dense L hemiplegia without pain perception. He requires total assist for mobility and all ADL. Pt with zero sitting balance at EOB with poor head control. Pt remains intubated via trach. Will need extensive care. Recommend SNF upon discharge. Will follow.  Follow Up Recommendations  SNF;Supervision/Assistance - 24 hour    Equipment Recommendations       Recommendations for Other Services       Precautions / Restrictions Precautions Precautions: Fall;Shoulder Type of Shoulder Precautions: L shoulder fx Precaution Comments: trach, peg Restrictions Weight Bearing Restrictions: Yes RUE Weight Bearing: Non weight bearing Other Position/Activity Restrictions: assume NWB RUE (comminuted humerus fx)      Mobility Bed Mobility Overal bed mobility: Needs Assistance Bed Mobility: Rolling;Supine to Sit;Sit to Supine Rolling: Total assist   Supine to sit: Total assist;+2 for physical assistance Sit to supine: Total assist;+2 for physical assistance   General bed mobility comments: total assist with pad to roll. total assist for bil LE movement and trunk control to pivot to EOB and back to bed. Total assist to scoot to [facility]  Transfers                 General transfer comment: unable    Balance Overall balance assessment: Needs assistance Sitting-balance support: Feet supported;Single extremity  supported Sitting balance-Leahy Scale: Zero Sitting balance - Comments: pt EOB 10 min with max assist for balance. Pt attempting to move RUE to support self at times and with min trunk activation with perturbation                                   ADL either performed or assessed with clinical judgement   ADL                                         General ADL Comments: requires total assist     Vision   Additional Comments: unable to assess due to lethargy     Perception     Praxis      Pertinent Vitals/Pain Pain Assessment: Faces Faces Pain Scale: No hurt     Hand Dominance     Extremity/Trunk Assessment Upper Extremity Assessment Upper Extremity Assessment: RUE deficits/detail RUE Deficits / Details: pt with humeral fx, able to grossly grasp and flex elbow to 90 degrees actively RUE: Unable to fully assess due to immobilization RUE Coordination: decreased fine motor;decreased gross motor LUE Deficits / Details: no painful response or active motion, +shoulder subluxation   Lower Extremity Assessment Lower Extremity Assessment: Defer to PT evaluation RLE Deficits / Details: pt with limited movement of RLE grossly 2-/5 knee flexion, toe movement and 1/5 quad activation, not to command LLE Deficits / Details: no painful response or active motion   Cervical / Trunk Assessment  Cervical / Trunk Assessment: Kyphotic (forward head, minimal trunk activation)   Communication Communication Communication: Tracheostomy   Cognition Arousal/Alertness: Lethargic Behavior During Therapy: Flat affect Overall Cognitive Status: Difficult to assess Area of Impairment: Attention                   Current Attention Level: Focused           General Comments: pt with assist to open eyes, maintained eyes open EOB only and then back to lethargic in supine.    General Comments       Exercises     Shoulder Instructions      Home Living  Family/patient expects to be discharged to:: Unsure                                 Additional Comments: per prior admission pt had a roomate and was independent but even at that time he would not provide home setup or PLOF and now unable      Prior Functioning/Environment Level of Independence: Independent                 OT Problem List: Decreased strength;Decreased activity tolerance;Impaired balance (sitting and/or standing);Decreased coordination;Decreased cognition;Cardiopulmonary status limiting activity;Pain;Impaired UE functional use      OT Treatment/Interventions: Self-care/ADL training;DME and/or AE instruction;Therapeutic activities;Cognitive remediation/compensation;Balance training;Neuromuscular education    OT Goals(Current goals can be found in the care plan section) Acute Rehab OT Goals OT Goal Formulation: Patient unable to participate in goal setting Time For Goal Achievement: 03/03/17 Potential to Achieve Goals: Fair ADL Goals Additional ADL Goal #1: Pt will demonstrate sustained attention. Additional ADL Goal #2: Pt will follow one step commands 50% of time. Additional ADL Goal #3: Pt will roll with max assist for positioning and pericare. Additional ADL Goal #4: Pt will sit EOB x 5 minutes with moderate assist in preparation for ADL.  OT Frequency: Min 3X/week   Barriers to D/C:            Co-evaluation PT/OT/SLP Co-Evaluation/Treatment: Yes Reason for Co-Treatment: Complexity of the patient's impairments (multi-system involvement)   OT goals addressed during session: Strengthening/ROM      AM-PAC PT "6 Clicks" Daily Activity     Outcome Measure Help from another person eating meals?: Total Help from another person taking care of personal grooming?: Total Help from another person toileting, which includes using toliet, bedpan, or urinal?: Total Help from another person bathing (including washing, rinsing, drying)?: Total Help from  another person to put on and taking off regular upper body clothing?: Total Help from another person to put on and taking off regular lower body clothing?: Total 6 Click Score: 6   End of Session Equipment Utilized During Treatment: Other (comment) (vent) Nurse Communication: Mobility status  Activity Tolerance: Patient limited by lethargy Patient left: in bed;with call bell/phone within reach;with bed alarm set  OT Visit Diagnosis: Muscle weakness (generalized) (M62.81)                Time: [id number] OT Time Calculation (min): 28 min Charges:  OT General Charges $OT Visit: 1 Visit OT Evaluation $OT Eval High Complexity: 1 High G-Codes:     02/17/2017 Martie RoundJulie Maitland [place], OTR/L Pager: [phone] [place] PlanasMayberry, [place] BailiffJulie Lynn 02/17/2017, 4:11 PM

## 2017-02-17 NOTE — Progress Notes (Addendum)
Patient is under the CCM service and was supposed to be transferred to the Hospitalist service from today. This morning upon review of chart, patient did not appear medically stable for transfer to the Hospitalist service. Hence I discussed with Dr. Merlene PullingHammonds, CCM who agrees and will keep patient on their service until improved and stable. I did not see the patient.  TR[facility] will sign off. Please consult us back as needed.  Marcellus ScottHONGALGI,Darleny Sem, MD, FACP, FHM. [facility] Pager [phone]  If 7PM-7AM, please contact night-coverage [email or web address] Password [email or web address] 02/17/2017, 1:41 PM

## 2017-02-17 NOTE — Progress Notes (Signed)
PULMONARY / CRITICAL CARE MEDICINE   Name: Stuart Mendoza Pautler MRN: [id number] DOB: 1956/04/11    ADMISSION DATE:  02/04/2017  REFERRING MD:  Dr. Sheria Langameron, ER  CHIEF COMPLAINT:  Altered mental status  HISTORY OF PRESENT ILLNESS:   61yo M with Afib (on xarelto), COPD, HTN, DM, and Etoh abuse, presented to ER on 10/6 with AMS and found to have large right parietal ICH with IVH and 1.7 cm midline shift with hydrocephalus. Intubated for airway, and given Kcentra.  Found to have RUL HCAP pneumonia. Had recent admission (9/29 to 10/04) for alcohol withdrawal and right humerus fracture (12/2016).   SUBJECTIVE:  10/12: s/p Tracheostomy 10/13: Tolerated ATC All day 10/14: Went back on vent 10/14 PM due to dyspnea.  10/15: on TC trial has RR 35-40 and thick purulent secretions from Trach. 10/16: febrile, continues to have thick purulent secretions via trach. Has intermittent cuff leak from trach for which trach was exchanged.  10/17: increased tachycardia, thick tan secretions, tmax 98.8, failed SBT secondary to rr in 40's, per RT- multiple mucous plugs requiring bag/lavage 10/18:  HR better controlled, tmax 102.1 but improving, Na improving with free water, following commands on right.  10/19: Episode of mucous plugging resolved with suctioning.   VITAL SIGNS: BP (!) 177/102   Pulse (!) 103   Temp 97.9 F (36.6 C) (Axillary)   Resp (!) 28   Ht 5\' 10"  (1.778 m)   Wt 93.5 kg (206 lb 2.1 oz)   SpO2 100%   BMI 29.58 kg/m   VENTILATOR SETTINGS: Vent Mode: PSV;CPAP FiO2 (%):  [40 %] 40 % Set Rate:  [12 bmp-14 bmp] 14 bmp Vt Set:  [620 mL] 620 mL PEEP:  [5 cmH20] 5 cmH20 Pressure Support:  [5 cmH20-8 cmH20] 5 cmH20 Plateau Pressure:  [16 cmH20-20 cmH20] 20 cmH20  INTAKE / OUTPUT: I/O last 3 completed shifts: In: 4674.6 [I.V.:727.5; NG/GT:3397.1; IV Piggyback:550] Out: 4098 [[id number] [Urine:1475; Stool:300]  PHYSICAL EXAMINATION:  General:  Chronically ill appearing male lying in bed in NAD HEENT:  [place]/AT, trach in place with tan and blood tinged secretions.  Neuro: purposeful weak movements on R, follows commands, left hemiplegia CV: IRIR, tachy. No MRG PULM: even/non-labored on MV, coarse bilateral breath sounds GI: obese, soft, non-tender, bs active  Extremities: warm/dry, no BLE edema, some RUE swelling Skin: no rashes, scabbed abrasions to right knee   LABS:  BMET  Recent Labs Lab 02/15/17 0258  02/16/17 0319 02/16/17 1350 02/17/17 0411  NA 152*  < > 146* 145 145  K 3.8  --  3.1*  --  3.3*  CL 119*  --  113*  --  111  CO2 26  --  25  --  25  BUN 32*  --  25*  --  22*  CREATININE 0.95  --  0.82  --  0.73  GLUCOSE 170*  --  237*  --  130*  < > = values in this interval not displayed.  Electrolytes  Recent Labs Lab 02/15/17 0258 02/16/17 0319 02/17/17 0411  CALCIUM 8.9 8.9 9.0  MG 2.6* 2.3 2.1  PHOS 3.4 3.7 3.6    CBC  Recent Labs Lab 02/15/17 0258 02/16/17 0319 02/17/17 0411  WBC 12.1* 11.3* 10.6*  HGB 9.2* 8.7* 8.9*  HCT 31.6* 30.0* 30.0*  PLT 327 314 363    Coag's No results for input(s): APTT, INR in the last 168 hours.  Sepsis Markers  Recent Labs Lab 02/14/17 1617 02/15/17 0258 02/16/17 0319  LATICACIDVEN  1.5  --   --   PROCALCITON <0.10 <0.10 <0.10    ABG  Recent Labs Lab 02/15/17 0410 02/16/17 0407 02/16/17 1640  PHART 7.493* 7.486* 7.470*  PCO2ART 36.3 34.7 34.7  PO2ART 122* 119* 79.0*    Liver Enzymes  Recent Labs Lab 02/13/17 1419  AST 52*  ALT 70*  ALKPHOS 181*  BILITOT 0.5  ALBUMIN 2.3*    Cardiac Enzymes No results for input(s): TROPONINI, PROBNP in the last 168 hours.  Glucose  Recent Labs Lab 02/16/17 1215 02/16/17 1722 02/16/17 2014 02/17/17 0024 02/17/17 0416 02/17/17 0746  GLUCAP 234* 174* 135* 187* 130* 99    Imaging Dg Chest Port 1 View  Result Date: 02/17/2017 CLINICAL DATA:  Atelectasis EXAM: PORTABLE CHEST 1 VIEW COMPARISON:  02/16/2017 FINDINGS: Tracheostomy tube and feeding  tube are unchanged. Cardiomegaly with vascular congestion. Worsening aeration in the lung bases. This may reflect a combination of edema and atelectasis is well is layering effusions. No acute bony abnormality. IMPRESSION: Worsening aeration in the lower lobes, likely a combination of edema, atelectasis and effusions. Electronically Signed   By: Charlett Nose M.D.   On: 02/17/2017 07:15   Dg Chest Port 1 View  Result Date: 02/16/2017 CLINICAL DATA:  Respiratory distress, question aspiration, history stroke, hypertension, diabetes mellitus, COPD, atrial fibrillation, smoker EXAM: PORTABLE CHEST 1 VIEW COMPARISON:  Portable exam 1608 hours compared to 02/15/2017 FINDINGS: Tip of tracheostomy tube projects 6.5 cm above carina. Feeding tube extends into stomach. Enlargement of cardiac silhouette. Mediastinal contours and pulmonary vascularity normal. Atherosclerotic calcification aorta. Atelectasis versus consolidation in retrocardiac LEFT lower lobe. Remaining lungs clear. No definite pleural effusion or pneumothorax. Prior plating of multiple lower LEFT ribs. Bones demineralized. IMPRESSION: Enlargement of cardiac silhouette. Atelectasis versus consolidation in retrocardiac LEFT lower lobe. Electronically Signed   By: Ulyses Southward M.D.   On: 02/16/2017 16:35   STUDIES:  CT head 10/6 > Large ICH Rt parietal lobe.  1.7 cm of midline shift.  Small amount of hemorrhage in the Lt frontal lobe and a small amount of subdural blood. CT head 10/9 > unchanged CT abd/pelvis 10/17 > No anatomic contraindication to gastrostomy. However, there is a possible communication between the greater curvature of the stomach and a loop of adjacent jejunum. This may be a fake out by CT.Mild edema in the mesentery without overt ascites or evidence of focal abscess.  CULTURES: Blood 10/6 >> 2/4 Coag neg staph, likely MRSE based on [facility] ID Urine 10/6 >> less than 10k colonies insignificant growth Sputum 10/6 >> culture order was  cancelled Tracheal asp 10/15 > Pseudomonas and Staph Lugdunensis both pan sensitive Blood 10/16 >>  ANTIBIOTICS: Vancomycin 10/6 >>10/8 Zosyn 10/6 >> 10/7; 10/16 >> CTX 10/8>>> 10/14  SIGNIFICANT EVENTS: 10/6 Admit, neurology, neurosurgery consulted, kcentra, 3% NS 10/12 remains with reasonable neurostatus 10/15: minimally responsive which sounds worse than prior baseline last week based on EMR notes.  10/16: fever, pan-cultured, cuff leak so trach exchanged for #6 distal XLT; restart zosyn  LINES/TUBES: ETT 10/06 >> 10/12 Trach #6 Shiley (DF) 10/12 >> 10/16 Trach #6 Distal XLT 10/16 >> L [place] TLC 10/6 >> 10/15  ASSESSMENT / PLAN: 61yo M with Afib (on xarelto), COPD, HTN, DM, and Etoh abuse, admit 10/6 with large right parietal ICH with IVH and 1.7 cm midline shift with hydrocephalus. Acute Hypoxic Respiratory failure with HCAP pneumonia. Had recent admission (9/29 to 10/04) for alcohol withdrawal and right humerus fracture (12/2016).   NEURO A: ICH,  IVH Hx ETOH abuse:  P: - PT/OT - Neuro signed off, needs outpatient follow up 2 months. - CCS will place PEG possibly 10/19 - continue Thiamine, folic acid, multivitamin  CARDIOVASCULAR A: AF RVR Hx Hypertension  P: - off therapeutic anticoag given ICH, can restart DOAC once hematoma resolved per neuro.  - continue dilt IV w/ Metoprolol 50mg  BID. Will ask RN to uptitrate dilt.  - Metoprolol and lisinopril for HTN  PULM A: Acute Hypoxic respiratory failure VAP - pseudomonas and staph Lugdunensis COPD without acute exacerbation Secretions with mucous plugging.  ? Pulmonary edema - s/p perc trach 10/12 P: - Continue zosyn - Full vent support with daily wean. Tolerating 5/5 briefly. Secretions are issue.  - CXR in am - Chest PT, mucomyst nebs - Scheduled xopenex/atrovent  INFECTIOUS DISEASE A: VAP - pseudomonas and staph Lugdunensis P: Zosyn started 10/16  ENDOCRINE: A: DM type II: remains hyperglycemic P: -  Continue sliding scale insulin- moderate - Lantus 25u daily 15u BID  RENAL: A: Hypokalemia: Hypernatremia:- improving P: - KCL replete 10/19 - free water to q 4 - Trend Bmet/ mag/ phos - replace electrolytes as needed - Monitor UOP; avoid nephrotoxic agents  HEME: 1. Anemia of critical illness and chronic disease: - had been stable   MUSCULOSKELETAL: A: Right Humerus fracture: - diagnosed 01/27/17 during a prior admission; occurred prior to admission when patient fell when walking his dog.  - had been maintained in a sling with plan for ortho followup. Now is not moving his right arm so sling is not needed at this time.  P: - monitor right arm swelling.   GI PPI Tube feeding at goal  - CT abd/pelvis pending - CCS following for PEG placement, possibly 10/19  Global: per CSW notes 10/12, patient is from [facility].  Pt's contact Dtr, [name] # [phone] or Brother, [name].  No family present 10/19  Joneen Roach, AGACNP-BC [facility] Pulmonology/Critical Care Pager [phone] or [phone]  02/17/2017 9:01 AM

## 2017-02-17 NOTE — Progress Notes (Signed)
Omni flex in place per RRT. Pt is stable at this time. CPT in progress tolerating it well.

## 2017-02-18 ENCOUNTER — Inpatient Hospital Stay (HOSPITAL_COMMUNITY): Payer: Medicare Other

## 2017-02-18 LAB — CBC WITH DIFFERENTIAL/PLATELET
BASOS ABS: 0 10*3/uL (ref 0.0–0.1)
Basophils Relative: 0 %
Eosinophils Absolute: 0.1 10*3/uL (ref 0.0–0.7)
Eosinophils Relative: 1 %
HCT: 31.4 % — ABNORMAL LOW (ref 39.0–52.0)
HEMOGLOBIN: 9.4 g/dL — AB (ref 13.0–17.0)
LYMPHS PCT: 8 %
Lymphs Abs: 0.9 10*3/uL (ref 0.7–4.0)
MCH: 26.5 pg (ref 26.0–34.0)
MCHC: 29.9 g/dL — ABNORMAL LOW (ref 30.0–36.0)
MCV: 88.5 fL (ref 78.0–100.0)
Monocytes Absolute: 0.8 10*3/uL (ref 0.1–1.0)
Monocytes Relative: 7 %
NEUTROS PCT: 84 %
Neutro Abs: 9.4 10*3/uL — ABNORMAL HIGH (ref 1.7–7.7)
Platelets: 440 10*3/uL — ABNORMAL HIGH (ref 150–400)
RBC: 3.55 MIL/uL — AB (ref 4.22–5.81)
RDW: 16.1 % — ABNORMAL HIGH (ref 11.5–15.5)
WBC: 11.1 10*3/uL — ABNORMAL HIGH (ref 4.0–10.5)

## 2017-02-18 LAB — BASIC METABOLIC PANEL
Anion gap: 10 (ref 5–15)
BUN: 22 mg/dL — AB (ref 6–20)
CHLORIDE: 104 mmol/L (ref 101–111)
CO2: 25 mmol/L (ref 22–32)
CREATININE: 0.82 mg/dL (ref 0.61–1.24)
Calcium: 8.8 mg/dL — ABNORMAL LOW (ref 8.9–10.3)
GFR calc non Af Amer: >60 mL/min (ref >60)
Glucose, Bld: 150 mg/dL — ABNORMAL HIGH (ref 65–99)
POTASSIUM: 3.3 mmol/L — AB (ref 3.5–5.1)
Sodium: 139 mmol/L (ref 135–145)

## 2017-02-18 LAB — GLUCOSE, CAPILLARY
GLUCOSE-CAPILLARY: 122 mg/dL — AB (ref 65–99)
GLUCOSE-CAPILLARY: 173 mg/dL — AB (ref 65–99)
Glucose-Capillary: 154 mg/dL — ABNORMAL HIGH (ref 65–99)
Glucose-Capillary: 164 mg/dL — ABNORMAL HIGH (ref 65–99)
Glucose-Capillary: 157 mg/dL — ABNORMAL HIGH (ref 65–99)

## 2017-02-18 LAB — MAGNESIUM: Magnesium: 2 mg/dL (ref 1.7–2.4)

## 2017-02-18 LAB — PHOSPHORUS: Phosphorus: 3.3 mg/dL (ref 2.5–4.6)

## 2017-02-18 MED ORDER — DILTIAZEM 12 MG/ML ORAL SUSPENSION
60.0000 mg | Freq: Four times a day (QID) | ORAL | Status: DC
  Administered 2017-02-18 – 2017-02-25 (×28): 60 mg via ORAL
  Filled 2017-02-18 (×30): qty 6

## 2017-02-18 MED ORDER — POTASSIUM CHLORIDE 10 MEQ/100ML IV SOLN
10.0000 meq | INTRAVENOUS | Status: AC
  Administered 2017-02-18 (×4): 10 meq via INTRAVENOUS
  Filled 2017-02-18 (×4): qty 100

## 2017-02-18 MED ORDER — FUROSEMIDE 10 MG/ML IJ SOLN
20.0000 mg | Freq: Two times a day (BID) | INTRAMUSCULAR | Status: DC
  Administered 2017-02-18 (×2): 20 mg via INTRAVENOUS
  Filled 2017-02-18 (×2): qty 2

## 2017-02-18 MED ORDER — FREE WATER
200.0000 mL | Status: DC
  Administered 2017-02-18 – 2017-02-22 (×23): 200 mL

## 2017-02-18 NOTE — Progress Notes (Signed)
Stuart Mendoza continues to be positional.  Good inspiratory volumes, poor expiratory.  Able to maneuver trach into good position but leak returns once hand removed.  Cuff pressures mov, trach ties are secure.  Patient is stable on vent, sats 100% on 40% Fi02.  MDs are aware of trach issues.  RN was at bedside during my exam.

## 2017-02-18 NOTE — Progress Notes (Signed)
PULMONARY / CRITICAL CARE MEDICINE   Name: Stuart FuelCharles Mendoza MRN: [id number] DOB: 28-Aug-1955    ADMISSION DATE:  02/04/2017  REFERRING MD:  Dr. Sheria Langameron, ER  CHIEF COMPLAINT:  Altered mental status  HISTORY OF PRESENT ILLNESS:   61yo M with Afib (on xarelto), COPD, HTN, DM, and Etoh abuse, presented to ER on 10/6 with AMS and found to have large right parietal ICH with IVH and 1.7 cm midline shift with hydrocephalus. Intubated for airway, and given Kcentra.  Found to have RUL HCAP pneumonia. Had recent admission (9/29 to 10/04) for alcohol withdrawal and right humerus fracture (12/2016).   SUBJECTIVE:  Remains trached and on vent   VITAL SIGNS: BP (!) 145/78   Pulse 98   Temp 99 F (37.2 C) (Axillary)   Resp (!) 31   Ht 5\' 10"  (1.778 m)   Wt 93.3 kg (205 lb 11 oz)   SpO2 100%   BMI 29.51 kg/m   VENTILATOR SETTINGS: Vent Mode: PSV;CPAP FiO2 (%):  [40 %] 40 % Set Rate:  [14 bmp] 14 bmp Vt Set:  [620 mL] 620 mL PEEP:  [5 cmH20] 5 cmH20 Pressure Support:  [10 cmH20] 10 cmH20 Plateau Pressure:  [16 cmH20-19 cmH20] 19 cmH20  INTAKE / OUTPUT: I/O last 3 completed shifts: In: 3482.6 [I.V.:343.8; Other:60; NG/GT:2728.8; IV Piggyback:350] Out: 3900 [Urine:3600; Stool:300]  PHYSICAL EXAMINATION:  General:  Chronically ill appearing male lying in bed in NAD HEENT: [place]/AT, trach in place with tan secretions (much less bloody today) Neuro: purposeful weak movements on R, follows commands, left hemiplegia CV: irreg irreg with rate 90's-100 PULM: even/non-labored on MV, CTA b/l GI: obese, soft, non-tender, bs active, PEG tube appears clean and intact Extremities: warm/dry, no BLE edema, some RUE swelling Skin: no rashes, scabbed abrasions to right knee   LABS:  BMET  Recent Labs Lab 02/17/17 0411 02/17/17 1540 02/18/17 0404  NA 145 142 139  K 3.3* 3.8 3.3*  CL 111 111 104  CO2 25 22 25   BUN 22* 19 22*  CREATININE 0.73 0.77 0.82  GLUCOSE 130* 118* 150*     Electrolytes  Recent Labs Lab 02/16/17 0319 02/17/17 0411 02/17/17 1540 02/18/17 0404  CALCIUM 8.9 9.0 9.0 8.8*  MG 2.3 2.1  --  2.0  PHOS 3.7 3.6  --  3.3    CBC  Recent Labs Lab 02/16/17 0319 02/17/17 0411 02/18/17 0404  WBC 11.3* 10.6* 11.1*  HGB 8.7* 8.9* 9.4*  HCT 30.0* 30.0* 31.4*  PLT 314 363 440*    Coag's No results for input(s): APTT, INR in the last 168 hours.  Sepsis Markers  Recent Labs Lab 02/14/17 1617 02/15/17 0258 02/16/17 0319  LATICACIDVEN 1.5  --   --   PROCALCITON <0.10 <0.10 <0.10    ABG  Recent Labs Lab 02/15/17 0410 02/16/17 0407 02/16/17 1640  PHART 7.493* 7.486* 7.470*  PCO2ART 36.3 34.7 34.7  PO2ART 122* 119* 79.0*    Liver Enzymes  Recent Labs Lab 02/13/17 1419  AST 52*  ALT 70*  ALKPHOS 181*  BILITOT 0.5  ALBUMIN 2.3*    Cardiac Enzymes No results for input(s): TROPONINI, PROBNP in the last 168 hours.  Glucose  Recent Labs Lab 02/17/17 0746 02/17/17 1220 02/17/17 1521 02/17/17 1920 02/17/17 2345 02/18/17 0727  GLUCAP 99 118* 119* 155* 167* 122*    Imaging Dg Chest Port 1 View  Result Date: 02/18/2017 CLINICAL DATA:  Ventilator associated pneumonia. EXAM: PORTABLE CHEST 1 VIEW COMPARISON:  02/17/2017 and prior  radiograph FINDINGS: Cardiomegaly and a tracheostomy tube again noted. Bibasilar atelectasis bibasilar opacities are slightly improved. There is no evidence of pneumothorax. No other significant change. IMPRESSION: Slightly improved bibasilar atelectasis/aeration without other significant change. Electronically Signed   By: Harmon Pier M.D.   On: 02/18/2017 07:03   STUDIES:  CT head 10/6 > Large ICH Rt parietal lobe.  1.7 cm of midline shift.  Small amount of hemorrhage in the Lt frontal lobe and a small amount of subdural blood. CT head 10/9 > unchanged CT abd/pelvis 10/17 > No anatomic contraindication to gastrostomy. However, there is a possible communication between the greater  curvature of the stomach and a loop of adjacent jejunum. This may be a fake out by CT.Mild edema in the mesentery without overt ascites or evidence of focal abscess.  CULTURES: Blood 10/6 >> 2/4 Coag neg staph, likely MRSE based on [facility] ID Urine 10/6 >> less than 10k colonies insignificant growth Sputum 10/6 >> culture order was cancelled Tracheal asp 10/15 > Pseudomonas and Staph Lugdunensis both pan sensitive Blood 10/16 >>  ANTIBIOTICS: Vancomycin 10/6 >>10/8 Zosyn 10/6 >> 10/7; 10/16 >> CTX 10/8>>> 10/14  SIGNIFICANT EVENTS: 10/6 Admit, neurology, neurosurgery consulted, kcentra, 3% NS 10/12 remains with reasonable neurostatus; s/p Tracheostomy 10/13: Tolerated ATC All day 10/14: Went back on vent 10/14 PM due to dyspnea.   10/15: minimally responsive which sounds worse than prior baseline last week based on EMR notes. on TC trial has RR 35-40 and thick purulent secretions from Trach. 10/16: fever, pan-cultured, cuff leak so trach exchanged for #6 distal XLT; restart zosyn 10/17: increased tachycardia, thick tan secretions, tmax 98.8, failed SBT secondary to rr in 40's, per RT- multiple mucous plugs requiring bag/lavage 10/18:  HR better controlled, tmax 102.1 but improving, Na improving with free water, following commands on right.  Still having mucous plugging 10/19: PEG placed by Trauma surgery; tolerated 2hrs of PSV 10/20: RT says no further mucous plugs but does have an intermittent cuff leak that is positional (still getting good tidal volumes in 500's despite leak)  LINES/TUBES: ETT 10/06 >> 10/12 Trach #6 Shiley (DF) 10/12 >> 10/16 Trach #6 Distal XLT 10/16 >> L [place] TLC 10/6 >> 10/15  ASSESSMENT / PLAN: 61yo M with Afib (on xarelto), COPD, HTN, DM, and Etoh abuse, admit 10/6 with large right parietal ICH with IVH and 1.7 cm midline shift with hydrocephalus. Acute Hypoxic Respiratory failure with HCAP pneumonia. Had recent admission (9/29 to 10/04) for alcohol withdrawal and  right humerus fracture (12/2016).   61yo M with Afib (on xarelto), COPD, HTN, DM, and Etoh abuse, admit 10/6 with large right parietal ICH with IVH and 1.7 cm midline shift with hydrocephalus. Acute Hypoxic Respiratory failure with HCAP pneumonia. Had recent admission (9/29 to 10/04) for alcohol withdrawal and right humerus fracture (12/2016).   NEURO 1; ICH, IVH 3. Hx ETOH abuse:  - PT/OT - Neuro signed off, needs outpatient follow up 2 months. - CCS placed PEG 10/19 - continue Thiamine, folic acid, multivitamin  CARDIOVASCULAR 1. Hx Hypertension: - continue metoprolol, lisinopril  2. Hx Afib: - off therapeutic anticoag now given ICH; can restart DOAC once hematoma resolved per neuro. - PO dilt has been held for PEG; restart Dilt 60mg  PO q6hrs now; was on Dilt gtt @ 5mg  this AM with rate-controlled Afib. Will stop dilt gtt now since restarting PO dilt.  - electrolyte repletion PRN  PULM 1. Acute Hypoxic respiratory failure; VAP pneumonia; Mucous plugging; Pulmonary edema; Hx  COPD: - s/p perc trach 10/12; was doing TC trials until developed tachypnea and fever on 10/15.  - completed 9 day antibiotic course already. Now febrile again and purulent secretions from trach. Pan-cultured and started Zosyn.  - sputum culture (10/15): pseudomonas and staph lugdenesis - continue zosyn now day 5/7 - tolerated 2hrs PSV trial on 10/19 and tolerated 3hrs PSV trial today; continue to extend time on PSV before trying TC again - exchanged trach to #6 distal XLT trach today due to cuff leak. Still has intermittent cuff leak but getting fair to good tidal volumes. Do not want to upsize trach as he had bleeding during last trach exchange - continue mucumyst nebs q8hr and atrovent nebs q4hrs to be timed with chest PT. Stop xopenex nebs. - CXR continues to show bibasilar infiltrates and mild pulm edema that is unchanged - continue lasix 20mg  IV BID for diuresis  INFECTIOUS DISEASE 1. Fever (now  afebrile) due to VAP: - see plan above.   ENDOCRINE: 1. DM type II:  - Continue sliding scale insulin - continue Lantus 25u daily  RENAL: 1. Hypernatremia (resolved); Hypokalemia: - continues to be hypokalemic; started PO daily. Gave an additional IV today as well - hypernatremia now resolved, decrease free water from 200cc q2hrs to q4hrs - Monitor UOP; avoid nephrotoxic agents  HEME: 1. Anemia of critical illness and chronic disease: - had been stable   MUSCULOSKELETAL: 1. Right Humerus fracture: - diagnosed 01/27/17 during a prior admission; occurred prior to admission when patient fell when walking his dog.  - had been maintained in a sling with plan for ortho followup. Now is not moving his right arm so sling is not needed at this time.   GI 1. Nutrition: - continue PPI - Tube feeding at goal via PEG   Global: per CSW notes 10/12, patient is from [facility].  Pt's contact Dtr, [name] # [phone] or Brother, [name].  No family present 10/19  35 minutes spent on this consult   Milana Obey, MD [facility] Pulmonology/Critical Care Pager [phone] or [phone]  02/18/2017 10:30 AM

## 2017-02-18 NOTE — Progress Notes (Signed)
Prior to trach ties being changed. Noted a loss in expiratory volumes on the ventilator. Janina Mayorach site was assess noted the trach flange is slightly deviated to patient right side. Once slight pressure is applied to the trach flange towards patient anterior neck I get good return in expiratory volumes and good overall minute ventilation. Once I remove my hand I lose some expiratory volumes. RN is aware of this. Patient is resting comfortably in bed. Patient is not tachycardiac, no increase in WOB, and SATs are stable on 40% Fio2 via mechanical ventilator. Trach flanged in secure with trach ties changed. Ties were soiled with some discoloration on them. No complications noted with the trach ties being changed.

## 2017-02-18 NOTE — Progress Notes (Signed)
RT called to pt's room due to low MVe alarm.  Pt has low VTe - trach looks to be positional.  Pt has a large cuff leak.  Pilot balloon appears to be inflated.  Pt placed back on full vent support.  Trach tube adjusted to center. Cuff deflated and re-inflated.  Return volumes increased but still less than VTi.  Pt still has a smaller audible cuff leak.  CCM notified. RN at bedside.

## 2017-02-18 NOTE — Op Note (Signed)
[facility] Patient Name: Stuart Mendoza Procedure Date : 02/17/2017 MRN: [id number] Attending MD: Kathrin Ruddy, MD Date of Birth: Dec 10, 1955 CSN: [id number] Age: 61 Admit Type: Inpatient Procedure:                Upper GI endoscopy Indications:              Place PEG because patient is unable to eat, Place                            PEG due to neurological disorder causing impaired                            swallowing Providers:                Kathrin Ruddy, MD, Kandice Robinsons, Technician Referring MD:              Medicines:                Fentanyl 100 micrograms IV, Midazolam 2 mg IV Complications:            No immediate complications. Estimated Blood Loss:      Procedure:                Pre-Anesthesia Assessment:                           - Prior to the procedure, a History and Physical                            was performed, and patient medications and                            allergies were reviewed. The patient is unable to                            give consent secondary to the patient's altered                            mental status. The risks and benefits of the                            procedure and the sedation options and risks were                            discussed with the patient's daughter. All                            questions were answered and informed consent was                            obtained. Patient identification and proposed                            procedure were verified by the physician and the  nurse Patient's room. Mental Status Examination:                            obtunded. Airway Examination: tracheostomy via                            ventilator. Respiratory Examination: clear to                            auscultation. CV Examination: normal. Prophylactic                            Antibiotics: The patient does not require                            prophylactic antibiotics.  Prior Anticoagulants: The                            patient has taken aspirin, last dose was 1 day                            prior to procedure. ASA Grade Assessment: IV - A                            patient with severe systemic disease that is a                            constant threat to life. After reviewing the risks                            and benefits, the patient was deemed in                            satisfactory condition to undergo the procedure.                            The anesthesia plan was to use moderate sedation /                            analgesia (conscious sedation). Immediately prior                            to administration of medications, the patient was                            re-assessed for adequacy to receive sedatives. The                            heart rate, respiratory rate, oxygen saturations,                            blood pressure, adequacy of pulmonary ventilation,  and response to care were monitored throughout the                            procedure. The physical status of the patient was                            re-assessed after the procedure.                           After obtaining informed consent, the endoscope was                            passed under direct vision. Throughout the                            procedure, the patient's blood pressure, pulse, and                            oxygen saturations were monitored continuously. The                            EG-[id number] ([id number]) scope was introduced through the                            mouth, and advanced to the third part of duodenum.                            The upper GI endoscopy was accomplished without                            difficulty. The patient tolerated the procedure                            well. Scope In: Scope Out: Findings:      The esophagus was normal.      Evidence of a patent Billroth II gastrojejunostomy was  found. The       gastrojejunal anastomosis was characterized by seems like prrevious       pyloric exclusion with gastrojejunostomy. This was traversed. The       efferent limb was examined 4 cm from anastomosis and was characterized       by healthy appearing mucosa. Contents were found including bile. The       afferent limb was examined 4 cm from anastomosis and was characterized       by healthy appearing mucosa. The patient was placed in the supine       position for PEG placement. The stomach was insufflated to appose       gastric and abdominal walls. A site was located in the body of the       stomach with excellent manual external pressure for placement. The       abdominal wall was marked and prepped in a sterile manner. The area was       anesthetized with 3 mL of 1% lidocaine. The trocar needle was introduced       through the abdominal wall and into the stomach under direct  endoscopic       view. A snare was introduced through the endoscope and opened in the       gastric lumen. The guide wire was passed through the trocar and into the       open snare. The snare was closed around the guide wire. The endoscope       and snare were removed, pulling the wire out through the mouth. A skin       incision was made at the site of needle insertion. The externally       removable 24 Fr EndoVive Safety gastrostomy tube was lubricated. The       G-tube was tied to the guide wire and pulled through the mouth and into       the stomach. The trocar needle was removed, and the gastrostomy tube was       pulled out from the stomach through the skin. The external bumper was       attached to the gastrostomy tube, and the tube was cut to remove the       guide wire. The final position of the gastrostomy tube was confirmed by       relook endoscopy, and skin marking noted to be 3 cm at the external       bumper. The final tension and compression of the abdominal wall by the       PEG tube and  external bumper were checked and revealed that the bumper       was moderately tight and mildly deforming the skin. The feeding tube was       capped, and the tube site cleaned and dressed.      One 3 mm sessile polyp with no bleeding was found in the first portion       of the duodenum. Impression:               - Normal esophagus.                           - Patent Billroth II gastrojejunostomy was found,                            characterized by seems like prrevious pyloric                            exclusion with gastrojejunostomy.                           - One duodenal polyp.                           - An externally removable PEG placement was                            successfully completed.                           - No specimens collected.                           - Normal posterior wall of the stomach. Recommendation:           - Please follow  the post-PEG recommendations                            including: use PEG today after checked by physician                            and start using PEG today. Procedure Code(s):        --- Professional ---                           [phone], Esophagogastroduodenoscopy, flexible,                            transoral; with directed placement of percutaneous                            gastrostomy tube Diagnosis Code(s):        --- Professional ---                           R63.3, Feeding difficulties                           Z43.1, Encounter for attention to gastrostomy                           R29.818, Other symptoms and signs involving the                            nervous system                           R13.10, Dysphagia, unspecified CPT copyright 2016 [employer]. All rights reserved. The codes documented in this report are preliminary and upon coder review may  be revised to meet current compliance requirements. Kathrin RuddyJames Annise Boran III, MD Jimmye NormanJames Marium Ragan III, MD 02/18/2017 11:15:26 AM This report has been signed  electronically. Number of Addenda: 0

## 2017-02-19 ENCOUNTER — Encounter (HOSPITAL_COMMUNITY): Payer: Self-pay | Admitting: General Surgery

## 2017-02-19 DIAGNOSIS — J95851 Ventilator associated pneumonia: Secondary | ICD-10-CM

## 2017-02-19 DIAGNOSIS — Z930 Tracheostomy status: Secondary | ICD-10-CM

## 2017-02-19 LAB — CBC WITH DIFFERENTIAL/PLATELET
BASOS ABS: 0 10*3/uL (ref 0.0–0.1)
BASOS PCT: 0 %
Eosinophils Absolute: 0.1 10*3/uL (ref 0.0–0.7)
Eosinophils Relative: 1 %
HEMATOCRIT: 31.9 % — AB (ref 39.0–52.0)
HEMOGLOBIN: 9.9 g/dL — AB (ref 13.0–17.0)
Lymphocytes Relative: 9 %
Lymphs Abs: 0.9 10*3/uL (ref 0.7–4.0)
MCH: 27.1 pg (ref 26.0–34.0)
MCHC: 31 g/dL (ref 30.0–36.0)
MCV: 87.4 fL (ref 78.0–100.0)
MONOS PCT: 7 %
Monocytes Absolute: 0.7 10*3/uL (ref 0.1–1.0)
NEUTROS ABS: 8.7 10*3/uL — AB (ref 1.7–7.7)
NEUTROS PCT: 83 %
Platelets: 417 10*3/uL — ABNORMAL HIGH (ref 150–400)
RBC: 3.65 MIL/uL — ABNORMAL LOW (ref 4.22–5.81)
RDW: 15.7 % — AB (ref 11.5–15.5)
WBC: 10.4 10*3/uL (ref 4.0–10.5)

## 2017-02-19 LAB — BASIC METABOLIC PANEL
ANION GAP: 9 (ref 5–15)
BUN: 20 mg/dL (ref 6–20)
CALCIUM: 9 mg/dL (ref 8.9–10.3)
CO2: 25 mmol/L (ref 22–32)
CREATININE: 0.75 mg/dL (ref 0.61–1.24)
Chloride: 105 mmol/L (ref 101–111)
Glucose, Bld: 125 mg/dL — ABNORMAL HIGH (ref 65–99)
Potassium: 3.6 mmol/L (ref 3.5–5.1)
SODIUM: 139 mmol/L (ref 135–145)

## 2017-02-19 LAB — GLUCOSE, CAPILLARY
GLUCOSE-CAPILLARY: 242 mg/dL — AB (ref 65–99)
GLUCOSE-CAPILLARY: 154 mg/dL — AB (ref 65–99)
GLUCOSE-CAPILLARY: 154 mg/dL — AB (ref 65–99)
Glucose-Capillary: 120 mg/dL — ABNORMAL HIGH (ref 65–99)
Glucose-Capillary: 146 mg/dL — ABNORMAL HIGH (ref 65–99)
Glucose-Capillary: 163 mg/dL — ABNORMAL HIGH (ref 65–99)
Glucose-Capillary: 182 mg/dL — ABNORMAL HIGH (ref 65–99)

## 2017-02-19 LAB — CULTURE, BLOOD (ROUTINE X 2)
CULTURE: NO GROWTH
CULTURE: NO GROWTH
Special Requests: ADEQUATE
Special Requests: ADEQUATE

## 2017-02-19 LAB — MAGNESIUM: Magnesium: 2 mg/dL (ref 1.7–2.4)

## 2017-02-19 LAB — PHOSPHORUS: PHOSPHORUS: 3.1 mg/dL (ref 2.5–4.6)

## 2017-02-19 MED ORDER — IPRATROPIUM BROMIDE 0.02 % IN SOLN
0.5000 mg | Freq: Four times a day (QID) | RESPIRATORY_TRACT | Status: DC
  Administered 2017-02-19 – 2017-02-23 (×14): 0.5 mg via RESPIRATORY_TRACT
  Filled 2017-02-19 (×13): qty 2.5

## 2017-02-19 MED ORDER — FUROSEMIDE 10 MG/ML IJ SOLN
40.0000 mg | Freq: Two times a day (BID) | INTRAMUSCULAR | Status: DC
  Administered 2017-02-19 – 2017-02-22 (×7): 40 mg via INTRAVENOUS
  Filled 2017-02-19 (×7): qty 4

## 2017-02-19 MED ORDER — LEVALBUTEROL HCL 0.63 MG/3ML IN NEBU
0.6300 mg | INHALATION_SOLUTION | Freq: Three times a day (TID) | RESPIRATORY_TRACT | Status: DC
  Administered 2017-02-19: 0.63 mg via RESPIRATORY_TRACT
  Filled 2017-02-19: qty 3

## 2017-02-19 MED ORDER — LEVALBUTEROL HCL 0.63 MG/3ML IN NEBU: 0.6300 mg | INHALATION_SOLUTION | Freq: Three times a day (TID) | RESPIRATORY_TRACT | Status: DC

## 2017-02-19 MED ORDER — POTASSIUM CHLORIDE 20 MEQ/15ML (10%) PO SOLN
40.0000 meq | Freq: Two times a day (BID) | ORAL | Status: DC
  Administered 2017-02-19 – 2017-02-26 (×14): 40 meq
  Filled 2017-02-19 (×15): qty 30

## 2017-02-19 MED ORDER — LEVALBUTEROL HCL 0.63 MG/3ML IN NEBU
0.6300 mg | INHALATION_SOLUTION | Freq: Three times a day (TID) | RESPIRATORY_TRACT | Status: DC | PRN
  Administered 2017-02-19 – 2017-02-22 (×9): 0.63 mg via RESPIRATORY_TRACT
  Filled 2017-02-19 (×9): qty 3

## 2017-02-19 NOTE — Progress Notes (Signed)
PROGRESS NOTE [facility]   Zenith Lamphier   JXB:[id number] DOB: [birth year]  DOA: 02/04/2017 PCP: Patient, No Pcp Per   Brief Narrative:  Stuart Mendoza is a 61 year old male with past medical history of A. fib on Xarelto, COPD, hypertension, diabetes, alcohol abuse who was admitted on 10/6 with large right parietal ICH and IVH, and a 1.7 cm midline shift with hydrocephalus.  Also was found to be in acute hypoxic respiratory failure due to hospital-acquired pneumonia and subsequently intubated. Patient was unable to be weaned off vent, trach was placed. PEG also performed. Remained stable and has been transferred to [facility] care. See significant events below.   Subjective: Patient seen and examined,  not awake, follows some commands like wiggling toe.  No other meaningful movements.  No acute events overnight he is off the ventilator this morning.  Assessment & Plan: ICH/IVH PT/OT -need LTAC Neurology sign off, outpatient follow-up in 2 months Need to repeat CAT scan at some point will discuss with neurology Continue thiamine, folic acid and multivitamin  Acute respiratory failure with hypoxia, VAP pneumonia, mucous plugging and pulmonary edema Status post PERC trach 10/12 Completed course of 9 days of antibiotics Sputum cultures grew Pseudomonas and staph lugdenesis  Patient on Zosyn 6/7 PCCM managing trach and ventilator appreciate recommendations Continue IV Lasix for diuresis for now Will check chest x-ray in the morning  PAF Off anticoagulation now given intracranial hemorrhage.  Unclear when to restart DOAC, will discuss with neurology.  Patient has been on Dilantin p.o. q. 6-hour heart rate is well controlled.  Continue to monitor  Hypertension blood pressure seems to be stable at this time Continue metoprolol and lisinopril  Diabetes type 2 CBGs seems to be stable Continue SSI  Continue Lantus 25 units daily  K-Lyte lamented-resolved  Anemia of  chronic disease and critical illness Hemoglobin remains stable  DVT prophylaxis: SCD,s  Code Status: Full code  Family Communication: None  Disposition Plan:  ?LTAC  SIGNIFICANT EVENTS: 10/6 Admit, neurology, neurosurgery consulted, kcentra, 3% NS 10/12 remains with reasonable neurostatus; s/p Tracheostomy 10/13: Tolerated ATC All day 10/14: Went back on vent 10/14 PM due to dyspnea.   10/15: minimally responsive which sounds worse than prior baseline last week based on EMR notes. on TC trial has RR 35-40 and thick purulent secretions from Trach. 10/16: fever, pan-cultured, cuff leak so trach exchanged for #6 distal XLT; restart zosyn 10/17: increased tachycardia, thick tan secretions, tmax 98.8, failed SBT secondary to rr in 40's, per RT- multiple mucous plugs requiring bag/lavage 10/18:  HR better controlled, tmax 102.1 but improving, Na improving with free water, following commands on right.  Still having mucous plugging 10/19: PEG placed by Trauma surgery; tolerated 2hrs of PSV 10/20: RT says no further mucous plugs but does have an intermittent cuff leak that is positional (still getting good tidal volumes in 500's despite leak)  Consultants:   PCCM  Neurology   Procedures:  ETT 10/06 >> 10/12 Trach #6 Shiley (DF) 10/12 >> 10/16 Trach #6 Distal XLT 10/16 >> L Hockley TLC 10/6 >> 10/15  Antimicrobials: Vancomycin 10/6 >>10/8 Zosyn 10/6 >> 10/7; 10/16 >> CTX 10/8>>> 10/14   Objective: Vitals:   02/19/17 1305 02/19/17 1400 02/19/17 1500 02/19/17 1545  BP:  107/76 116/65 116/65  Pulse:  61 86 96  Resp:  (!) 38 (!) 35 (!) 36  Temp: 98.5 F (36.9 C)     TempSrc:      SpO2:  100% 97% 97%  Weight:      Height:        Intake/Output Summary (Last 24 hours) at 02/19/17 1650 Last data filed at 02/19/17 1500  Gross per 24 hour  Intake             2415 ml  Output             3275 ml  Net             -860 ml   Filed Weights   02/17/17 0500 02/18/17 0107 02/19/17 0900    Weight: 93.5 kg (206 lb 2.1 oz) 93.3 kg (205 lb 11 oz) 92.1 kg (203 lb 0.7 oz)    Examination:  General exam: NAD HEENT: Does not open his eyes, trach in place with high output Respiratory system: Coarse breath sounds, good aeration Cardiovascular system: S1 & S2 heard, RRR. No JVD, murmurs, rubs or gallops Gastrointestinal system: Abdomen is nondistended, soft and nontender.  Central nervous system: Respond to some simple commands like we go to but did not open his eyes.  Left side hemiparesis. Extremities: No pedal edema.  Skin: No rashes, lesions or ulcers  Data Reviewed: I have personally reviewed following labs and imaging studies  CBC:  Recent Labs Lab 02/15/17 0258 02/16/17 0319 02/17/17 0411 02/18/17 0404 02/19/17 0341  WBC 12.1* 11.3* 10.6* 11.1* 10.4  NEUTROABS 10.0* 9.5* 8.8* 9.4* 8.7*  HGB 9.2* 8.7* 8.9* 9.4* 9.9*  HCT 31.6* 30.0* 30.0* 31.4* 31.9*  MCV 90.8 90.9 89.6 88.5 87.4  PLT 327 314 363 440* 027*   Basic Metabolic Panel:  Recent Labs Lab 02/15/17 0258  02/16/17 0319 02/16/17 1350 02/17/17 0411 02/17/17 1540 02/18/17 0404 02/19/17 0341  NA 152*  < > 146* 145 145 142 139 139  K 3.8  --  3.1*  --  3.3* 3.8 3.3* 3.6  CL 119*  --  113*  --  111 111 104 105  CO2 26  --  25  --  _0 GLUCOSE 170*  --  237*  --  130* 118* 150* 125*  BUN 32*  --  25*  --  22* 19 22* 20  CREATININE 0.95  --  0.82  --  0.73 0.77 0.82 0.75  CALCIUM 8.9  --  8.9  --  9.0 9.0 8.8* 9.0  MG 2.6*  --  2.3  --  2.1  --  2.0 2.0  PHOS 3.4  --  3.7  --  3.6  --  3.3 3.1  < > = values in this interval not displayed. GFR: Estimated Creatinine Clearance: 109.1 mL/min (by C-G formula based on SCr of 0.75 mg/dL). Liver Function Tests:  Recent Labs Lab 02/13/17 1419  AST 52*  ALT 70*  ALKPHOS 181*  BILITOT 0.5  PROT 6.0*  ALBUMIN 2.3*   No results for input(s): LIPASE, AMYLASE in the last 168 hours. No results for input(s): AMMONIA in the last 168  hours. Coagulation Profile: No results for input(s): INR, PROTIME in the last 168 hours. Cardiac Enzymes: No results for input(s): CKTOTAL, CKMB, CKMBINDEX, TROPONINI in the last 168 hours. BNP (last 3 results) No results for input(s): PROBNP in the last 8760 hours. HbA1C: No results for input(s): HGBA1C in the last 72 hours. CBG:  Recent Labs Lab 02/19/17 0010 02/19/17 0401 02/19/17 0750 02/19/17 1323 02/19/17 1551  GLUCAP 163* 120* 146* 242* 154*   Lipid Profile: No results for input(s): CHOL, HDL, LDLCALC, TRIG, CHOLHDL, LDLDIRECT in the  last 72 hours. Thyroid Function Tests: No results for input(s): TSH, T4TOTAL, FREET4, T3FREE, THYROIDAB in the last 72 hours. Anemia Panel: No results for input(s): VITAMINB12, FOLATE, FERRITIN, TIBC, IRON, RETICCTPCT in the last 72 hours. Sepsis Labs:  Recent Labs Lab 02/14/17 1617 02/15/17 0258 02/16/17 0319  PROCALCITON <0.10 <0.10 <0.10  LATICACIDVEN 1.5  --   --     Recent Results (from the past 240 hour(s))  Culture, respiratory (NON-Expectorated)     Status: None   Collection Time: 02/13/17  2:35 PM  Result Value Ref Range Status   Specimen Description TRACHEAL ASPIRATE  Final   Special Requests Normal  Final   Gram Stain   Final    MODERATE WBC PRESENT,BOTH PMN AND MONONUCLEAR FEW GRAM POSITIVE COCCI IN PAIRS RARE GRAM NEGATIVE RODS    Culture   Final    FEW STAPHYLOCOCCUS LUGDUNENSIS RARE PSEUDOMONAS AERUGINOSA    Report Status 02/16/2017 FINAL  Final   Organism ID, Bacteria PSEUDOMONAS AERUGINOSA  Final   Organism ID, Bacteria STAPHYLOCOCCUS LUGDUNENSIS  Final      Susceptibility   Staphylococcus lugdunensis - MIC*    CIPROFLOXACIN <=0.5 SENSITIVE Sensitive     ERYTHROMYCIN <=0.25 SENSITIVE Sensitive     GENTAMICIN <=0.5 SENSITIVE Sensitive     OXACILLIN 2 SENSITIVE Sensitive     TETRACYCLINE <=1 SENSITIVE Sensitive     VANCOMYCIN <=0.5 SENSITIVE Sensitive     TRIMETH/SULFA <=10 SENSITIVE Sensitive      CLINDAMYCIN <=0.25 SENSITIVE Sensitive     RIFAMPIN <=0.5 SENSITIVE Sensitive     Inducible Clindamycin NEGATIVE Sensitive     * FEW STAPHYLOCOCCUS LUGDUNENSIS   Pseudomonas aeruginosa - MIC*    CEFTAZIDIME 4 SENSITIVE Sensitive     CIPROFLOXACIN <=0.25 SENSITIVE Sensitive     GENTAMICIN <=1 SENSITIVE Sensitive     IMIPENEM 1 SENSITIVE Sensitive     PIP/TAZO 8 SENSITIVE Sensitive     CEFEPIME 2 SENSITIVE Sensitive     * RARE PSEUDOMONAS AERUGINOSA  Culture, blood (routine x 2)     Status: None   Collection Time: 02/14/17 11:00 AM  Result Value Ref Range Status   Specimen Description BLOOD RIGHT HAND  Final   Special Requests IN PEDIATRIC BOTTLE Blood Culture adequate volume  Final   Culture NO GROWTH 5 DAYS  Final   Report Status 02/19/2017 FINAL  Final  Culture, blood (routine x 2)     Status: None   Collection Time: 02/14/17 11:05 AM  Result Value Ref Range Status   Specimen Description BLOOD LEFT HAND  Final   Special Requests IN PEDIATRIC BOTTLE Blood Culture adequate volume  Final   Culture NO GROWTH 5 DAYS  Final   Report Status 02/19/2017 FINAL  Final     Radiology Studies: Dg Chest Port 1 View  Result Date: 02/18/2017 CLINICAL DATA:  Ventilator associated pneumonia. EXAM: PORTABLE CHEST 1 VIEW COMPARISON:  02/17/2017 and prior radiograph FINDINGS: Cardiomegaly and a tracheostomy tube again noted. Bibasilar atelectasis bibasilar opacities are slightly improved. There is no evidence of pneumothorax. No other significant change. IMPRESSION: Slightly improved bibasilar atelectasis/aeration without other significant change. Electronically Signed   By: Margarette Canada M.D.   On: 02/18/2017 07:03    Scheduled Meds: . acetylcysteine  2 mL Nebulization Q8H  . chlorhexidine gluconate (MEDLINE KIT)  15 mL Mouth Rinse BID  . diltiazem  60 mg Oral Q6H  . docusate  100 mg Per Tube Daily  . feeding supplement (PRO-STAT SUGAR FREE 64)  30 mL Per Tube BID  . folic acid  1 mg Per Tube  Daily  . free water  200 mL Per Tube Q4H  . furosemide  40 mg Intravenous Q12H  . guaiFENesin  10 mL Per Tube Q4H  . heparin subcutaneous  5,000 Units Subcutaneous Q8H  . insulin aspart  0-15 Units Subcutaneous Q4H  . insulin glargine  25 Units Subcutaneous QHS  . ipratropium  0.5 mg Nebulization Q4H  . lisinopril  20 mg Per Tube Daily  . mouth rinse  15 mL Mouth Rinse Q4H  . metoprolol tartrate  50 mg Per Tube BID  . multivitamin  15 mL Per Tube Daily  . pantoprazole sodium  40 mg Per Tube Q24H  . potassium chloride  40 mEq Per Tube BID  . thiamine  100 mg Per Tube Daily   Continuous Infusions: . diltiazem (CARDIZEM) infusion Stopped (02/18/17 1015)  . feeding supplement (GLUCERNA 1.2 CAL) 1,000 mL (02/19/17 1500)  . piperacillin-tazobactam (ZOSYN)  IV 3.375 g (02/19/17 1328)     LOS: 15 days    Time spent: Total of 25 minutes spent with pt, greater than 50% of which was spent in discussion of  treatment, counseling and coordination of care   Chipper Oman, MD Pager: Text Page via [email or web address]   If 7PM-7AM, please contact night-coverage [email or web address] 02/19/2017, 4:50 PM

## 2017-02-19 NOTE — Progress Notes (Signed)
PULMONARY / CRITICAL CARE MEDICINE   Name: Stuart Mendoza MRN: [id number] DOB: 12/01/1955    ADMISSION DATE:  02/04/2017  REFERRING MD:  Dr. Sheria Langameron, ER  CHIEF COMPLAINT:  Altered mental status  HISTORY OF PRESENT ILLNESS:   61yo M with Afib (on xarelto), COPD, HTN, DM, and Etoh abuse, presented to ER on 10/6 with AMS and found to have large right parietal ICH with IVH and 1.7 cm midline shift with hydrocephalus. Intubated for airway, and given Kcentra.  Found to have RUL HCAP pneumonia. Had recent admission (9/29 to 10/04) for alcohol withdrawal and right humerus fracture (12/2016).   SUBJECTIVE:  Remains trached currently on TC  VITAL SIGNS: BP (!) 135/92   Pulse 98   Temp 98.4 F (36.9 C) (Axillary)   Resp (!) 23   Ht 5\' 10"  (1.778 m)   Wt 93.3 kg (205 lb 11 oz)   SpO2 98%   BMI 29.51 kg/m   VENTILATOR SETTINGS: Vent Mode: PRVC FiO2 (%):  [40 %] 40 % Set Rate:  [14 bmp] 14 bmp Vt Set:  [620 mL] 620 mL PEEP:  [5 cmH20] 5 cmH20 Plateau Pressure:  [13 cmH20-17 cmH20] 16 cmH20  INTAKE / OUTPUT: I/O last 3 completed shifts: In: 5306.3 [I.V.:151.3; Other:230; NG/GT:4275; IV Piggyback:650] Out: 3725 [Urine:3600; Stool:125]  PHYSICAL EXAMINATION:  General:  Chronically ill appearing male in bed on TC, NAD HEENT: [place]/AT, PERRL, EOM-I and MMM, trach in place Neuro: Not following commands on the left CV: IRIR, Nl S1/S2, -M/R/G. PULM: Coarse BS diffusely GI: Obese, soft, NT, ND and +BS Extremities: warm/dry, no BLE edema, some RUE swelling Skin: no rashes, scabbed abrasions to right knee  LABS:  BMET  Recent Labs Lab 02/17/17 1540 02/18/17 0404 02/19/17 0341  NA 142 139 139  K 3.8 3.3* 3.6  CL 111 104 105  CO2 22 25 25   BUN 19 22* 20  CREATININE 0.77 0.82 0.75  GLUCOSE 118* 150* 125*   Electrolytes  Recent Labs Lab 02/17/17 0411 02/17/17 1540 02/18/17 0404 02/19/17 0341  CALCIUM 9.0 9.0 8.8* 9.0  MG 2.1  --  2.0 2.0  PHOS 3.6  --  3.3 3.1     CBC  Recent Labs Lab 02/17/17 0411 02/18/17 0404 02/19/17 0341  WBC 10.6* 11.1* 10.4  HGB 8.9* 9.4* 9.9*  HCT 30.0* 31.4* 31.9*  PLT 363 440* 417*    Coag's No results for input(s): APTT, INR in the last 168 hours.  Sepsis Markers  Recent Labs Lab 02/14/17 1617 02/15/17 0258 02/16/17 0319  LATICACIDVEN 1.5  --   --   PROCALCITON <0.10 <0.10 <0.10    ABG  Recent Labs Lab 02/15/17 0410 02/16/17 0407 02/16/17 1640  PHART 7.493* 7.486* 7.470*  PCO2ART 36.3 34.7 34.7  PO2ART 122* 119* 79.0*    Liver Enzymes  Recent Labs Lab 02/13/17 1419  AST 52*  ALT 70*  ALKPHOS 181*  BILITOT 0.5  ALBUMIN 2.3*    Cardiac Enzymes No results for input(s): TROPONINI, PROBNP in the last 168 hours.  Glucose  Recent Labs Lab 02/18/17 0427 02/18/17 0727 02/18/17 1157 02/18/17 1559 02/18/17 1929 02/19/17 0010  GLUCAP 154* 122* 173* 164* 157* 163*    Imaging No results found. STUDIES:  CT head 10/6 > Large ICH Rt parietal lobe.  1.7 cm of midline shift.  Small amount of hemorrhage in the Lt frontal lobe and a small amount of subdural blood. CT head 10/9 > unchanged CT abd/pelvis 10/17 > No anatomic contraindication  to gastrostomy. However, there is a possible communication between the greater curvature of the stomach and a loop of adjacent jejunum. This may be a fake out by CT.Mild edema in the mesentery without overt ascites or evidence of focal abscess.  CULTURES: Blood 10/6 >> 2/4 Coag neg staph, likely MRSE based on [facility] ID Urine 10/6 >> less than 10k colonies insignificant growth Sputum 10/6 >> culture order was cancelled Tracheal asp 10/15 > Pseudomonas and Staph Lugdunensis both pan sensitive Blood 10/16 >>  ANTIBIOTICS: Vancomycin 10/6 >>10/8 Zosyn 10/6 >> 10/7; 10/16 >> CTX 10/8>>> 10/14  SIGNIFICANT EVENTS: 10/6 Admit, neurology, neurosurgery consulted, kcentra, 3% NS 10/12 remains with reasonable neurostatus; s/p Tracheostomy 10/13: Tolerated  ATC All day 10/14: Went back on vent 10/14 PM due to dyspnea.   10/15: minimally responsive which sounds worse than prior baseline last week based on EMR notes. on TC trial has RR 35-40 and thick purulent secretions from Trach. 10/16: fever, pan-cultured, cuff leak so trach exchanged for #6 distal XLT; restart zosyn 10/17: increased tachycardia, thick tan secretions, tmax 98.8, failed SBT secondary to rr in 40's, per RT- multiple mucous plugs requiring bag/lavage 10/18:  HR better controlled, tmax 102.1 but improving, Na improving with free water, following commands on right.  Still having mucous plugging 10/19: PEG placed by Trauma surgery; tolerated 2hrs of PSV 10/20: RT says no further mucous plugs but does have an intermittent cuff leak that is positional (still getting good tidal volumes in 500's despite leak)  LINES/TUBES: ETT 10/06 >> 10/12 Trach #6 Shiley (DF) 10/12 >> 10/16 Trach #6 Distal XLT 10/16 >> L [place] TLC 10/6 >> 10/15  I reviewed CXR myself, trach is in good position  ASSESSMENT / PLAN: 61yo M with Afib (on xarelto), COPD, HTN, DM, and Etoh abuse, admit 10/6 with large right parietal ICH with IVH and 1.7 cm midline shift with hydrocephalus. Acute Hypoxic Respiratory failure with HCAP pneumonia. Had recent admission (9/29 to 10/04) for alcohol withdrawal and right humerus fracture (12/2016).   61yo M with Afib (on xarelto), COPD, HTN, DM, and Etoh abuse, admit 10/6 with large right parietal ICH with IVH and 1.7 cm midline shift with hydrocephalus. Acute Hypoxic Respiratory failure with HCAP pneumonia. Had recent admission (9/29 to 10/04) for alcohol withdrawal and right humerus fracture (12/2016).  Discussed with RT and PCCM-NP.  PULM 1. Acute Hypoxic respiratory failure; VAP pneumonia; Mucous plugging; Pulmonary edema; Hx COPD: - S/p perc trach 10/12; was doing TC trials until developed tachypnea and fever on 10/15.  - Completed 9 day antibiotic course already. Now febrile again  and purulent secretions from trach - Pan-cultured and started Zosyn.  - Sputum culture (10/15): pseudomonas and staph lugdenesis - Continue zosyn now day 6/7 - TC as tolerated - Do not down size trach - Continue mucumyst nebs q8hr and atrovent nebs q4hrs to be timed with chest PT. Stop xopenex nebs. - CXR continues to show bibasilar infiltrates and mild pulm edema that is unchanged - Lasix 20mg  IV BID for diuresis  Alyson Reedy, M.D. [facility] Pulmonary/Critical Care Medicine. Pager: [phone]. After hours pager: [phone].  02/19/2017 8:19 AM

## 2017-02-19 NOTE — Progress Notes (Signed)
[facility] FOR AM LAB REPLACEMENT ONLY  The patient does not apply for the [facility] Electrolyte Replacment Protocol based on the criteria listed below:   Abnormal electrolyte(s):K3.6 Ordered repletion with: unable due to scheduled med . If a panic level lab has been reported, has the CCM MD in charge been notified? Yes.  .   Physician:  Dr. Francis GainesSmith  Baudelio Karnes, Charlynn GrimesMark William 02/19/2017 6:19 AM

## 2017-02-20 ENCOUNTER — Inpatient Hospital Stay (HOSPITAL_COMMUNITY): Payer: Medicare Other

## 2017-02-20 LAB — CBC WITH DIFFERENTIAL/PLATELET
BASOS PCT: 0 %
Basophils Absolute: 0 10*3/uL (ref 0.0–0.1)
EOS ABS: 0.1 10*3/uL (ref 0.0–0.7)
EOS PCT: 1 %
HEMATOCRIT: 32.6 % — AB (ref 39.0–52.0)
Hemoglobin: 10 g/dL — ABNORMAL LOW (ref 13.0–17.0)
Lymphocytes Relative: 12 %
Lymphs Abs: 1.4 10*3/uL (ref 0.7–4.0)
MCH: 26.8 pg (ref 26.0–34.0)
MCHC: 30.7 g/dL (ref 30.0–36.0)
MCV: 87.4 fL (ref 78.0–100.0)
MONO ABS: 0.6 10*3/uL (ref 0.1–1.0)
MONOS PCT: 6 %
NEUTROS ABS: 9.4 10*3/uL — AB (ref 1.7–7.7)
Neutrophils Relative %: 81 %
Platelets: 548 10*3/uL — ABNORMAL HIGH (ref 150–400)
RBC: 3.73 MIL/uL — ABNORMAL LOW (ref 4.22–5.81)
RDW: 16.1 % — ABNORMAL HIGH (ref 11.5–15.5)
WBC: 11.6 10*3/uL — ABNORMAL HIGH (ref 4.0–10.5)

## 2017-02-20 LAB — GLUCOSE, CAPILLARY
Glucose-Capillary: 115 mg/dL — ABNORMAL HIGH (ref 65–99)
Glucose-Capillary: 125 mg/dL — ABNORMAL HIGH (ref 65–99)
Glucose-Capillary: 135 mg/dL — ABNORMAL HIGH (ref 65–99)
Glucose-Capillary: 140 mg/dL — ABNORMAL HIGH (ref 65–99)
Glucose-Capillary: 206 mg/dL — ABNORMAL HIGH (ref 65–99)
Glucose-Capillary: 237 mg/dL — ABNORMAL HIGH (ref 65–99)

## 2017-02-20 LAB — BASIC METABOLIC PANEL
Anion gap: 6 (ref 5–15)
BUN: 23 mg/dL — ABNORMAL HIGH (ref 6–20)
CALCIUM: 9.1 mg/dL (ref 8.9–10.3)
CO2: 25 mmol/L (ref 22–32)
CREATININE: 0.84 mg/dL (ref 0.61–1.24)
Chloride: 105 mmol/L (ref 101–111)
GFR calc Af Amer: >60 mL/min (ref >60)
GFR calc non Af Amer: >60 mL/min (ref >60)
GLUCOSE: 140 mg/dL — AB (ref 65–99)
Potassium: 3.5 mmol/L (ref 3.5–5.1)
Sodium: 136 mmol/L (ref 135–145)

## 2017-02-20 LAB — MAGNESIUM: Magnesium: 2 mg/dL (ref 1.7–2.4)

## 2017-02-20 LAB — PHOSPHORUS: PHOSPHORUS: 3.1 mg/dL (ref 2.5–4.6)

## 2017-02-20 NOTE — Progress Notes (Signed)
Nurse called daughter to let her know patient had been transferred. No answer, message left. Dicie BeamFrazier, Sameen Leas RN BSN.

## 2017-02-20 NOTE — Progress Notes (Signed)
PULMONARY / CRITICAL CARE MEDICINE   Name: Stuart Mendoza MRN: [id number] DOB: June 04, 1955    ADMISSION DATE:  02/04/2017  REFERRING MD:  Dr. Sheria Langameron, ER  CHIEF COMPLAINT:  Altered mental status  HISTORY OF PRESENT ILLNESS:   61yo M with Afib (on xarelto), COPD, HTN, DM, and Etoh abuse, presented to ER on 10/6 with AMS and found to have large right parietal ICH with IVH and 1.7 cm midline shift with hydrocephalus. Intubated for airway, and given Kcentra.  Found to have RUL HCAP pneumonia. Had recent admission (9/29 to 10/04) for alcohol withdrawal and right humerus fracture (12/2016).   SUBJECTIVE:  Trach collar x 24 hours Still with thick secretions despite mucomyst and chest PT.   VITAL SIGNS: BP 137/80   Pulse 84   Temp 98.2 F (36.8 C) (Axillary)   Resp (!) 34   Ht 5\' 10"  (1.778 m)   Wt 91.5 kg (201 lb 11.5 oz)   SpO2 98%   BMI 28.94 kg/m   VENTILATOR SETTINGS: Vent Mode: PSV;CPAP FiO2 (%):  [40 %-60 %] 60 % PEEP:  [5 cmH20] 5 cmH20 Pressure Support:  [5 cmH20] 5 cmH20  INTAKE / OUTPUT: I/O last 3 completed shifts: In: 3296.1 [Other:170; NG/GT:2876.1; IV Piggyback:250] Out: 4950 [Urine:4775; Stool:175]  PHYSICAL EXAMINATION:  General:  Chronically ill appearing male in NAD on trach collar.  HEENT: [place]/AT, PERRL, no JVD Neuro: Does not open eyes, but follows on R.  CV: IRIR no MRG PULM: Coarse BS diffusely GI: Obese, soft, NT, ND and +BS Extremities: warm/dry, no BLE edema, some RUE swelling Skin: grossly intact  LABS:  BMET  Recent Labs Lab 02/18/17 0404 02/19/17 0341 02/20/17 0248  NA 139 139 136  K 3.3* 3.6 3.5  CL 104 105 105  CO2 25 25 25   BUN 22* 20 23*  CREATININE 0.82 0.75 0.84  GLUCOSE 150* 125* 140*   Electrolytes  Recent Labs Lab 02/18/17 0404 02/19/17 0341 02/20/17 0248  CALCIUM 8.8* 9.0 9.1  MG 2.0 2.0 2.0  PHOS 3.3 3.1 3.1    CBC  Recent Labs Lab 02/18/17 0404 02/19/17 0341 02/20/17 0248  WBC 11.1* 10.4 11.6*  HGB  9.4* 9.9* 10.0*  HCT 31.4* 31.9* 32.6*  PLT 440* 417* 548*    Coag's No results for input(s): APTT, INR in the last 168 hours.  Sepsis Markers  Recent Labs Lab 02/14/17 1617 02/15/17 0258 02/16/17 0319  LATICACIDVEN 1.5  --   --   PROCALCITON <0.10 <0.10 <0.10    ABG  Recent Labs Lab 02/15/17 0410 02/16/17 0407 02/16/17 1640  PHART 7.493* 7.486* 7.470*  PCO2ART 36.3 34.7 34.7  PO2ART 122* 119* 79.0*    Liver Enzymes  Recent Labs Lab 02/13/17 1419  AST 52*  ALT 70*  ALKPHOS 181*  BILITOT 0.5  ALBUMIN 2.3*    Cardiac Enzymes No results for input(s): TROPONINI, PROBNP in the last 168 hours.  Glucose  Recent Labs Lab 02/19/17 1323 02/19/17 1551 02/19/17 1927 02/19/17 2328 02/20/17 0306 02/20/17 0750  GLUCAP 242* 154* 154* 182* 140* 135*    Imaging No results found. STUDIES:  CT head 10/6 > Large ICH Rt parietal lobe.  1.7 cm of midline shift.  Small amount of hemorrhage in the Lt frontal lobe and a small amount of subdural blood. CT head 10/9 > unchanged CT abd/pelvis 10/17 > No anatomic contraindication to gastrostomy. However, there is a possible communication between the greater curvature of the stomach and a loop of adjacent jejunum.  This may be a fake out by CT.Mild edema in the mesentery without overt ascites or evidence of focal abscess.  CULTURES: Blood 10/6 >> 2/4 Coag neg staph, likely MRSE based on [facility] ID Urine 10/6 >> less than 10k colonies insignificant growth Sputum 10/6 >> culture order was cancelled Tracheal asp 10/15 > Pseudomonas and Staph Lugdunensis both pan sensitive Blood 10/16 >>  ANTIBIOTICS: Vancomycin 10/6 >>10/8 Zosyn 10/6 >> 10/7; 10/16 >> CTX 10/8>>> 10/14  SIGNIFICANT EVENTS: 10/6 Admit, neurology, neurosurgery consulted, kcentra, 3% NS 10/12 remains with reasonable neurostatus; s/p Tracheostomy 10/13: Tolerated ATC All day 10/14: Went back on vent 10/14 PM due to dyspnea.   10/15: minimally responsive which  sounds worse than prior baseline last week based on EMR notes. on TC trial has RR 35-40 and thick purulent secretions from Trach. 10/16: fever, pan-cultured, cuff leak so trach exchanged for #6 distal XLT; restart zosyn 10/17: increased tachycardia, thick tan secretions, tmax 98.8, failed SBT secondary to rr in 40's, per RT- multiple mucous plugs requiring bag/lavage 10/18:  HR better controlled, tmax 102.1 but improving, Na improving with free water, following commands on right.  Still having mucous plugging 10/19: PEG placed by Trauma surgery; tolerated 2hrs of PSV 10/20: RT says no further mucous plugs but does have an intermittent cuff leak that is positional (still getting good tidal volumes in 500's despite leak) 10/22 on TC for 24 hours at this point. Tolerating well.   LINES/TUBES: ETT 10/06 >> 10/12 Trach #6 Shiley (DF) 10/12 >> 10/16 Trach #6 Distal XLT 10/16 >> L [place] TLC 10/6 >> 10/15   ASSESSMENT / PLAN: 61yo M with Afib (on xarelto), COPD, HTN, DM, and Etoh abuse, admit 10/6 with large right parietal ICH with IVH and 1.7 cm midline shift with hydrocephalus. Acute Hypoxic Respiratory failure with HCAP pneumonia. Had recent admission (9/29 to 10/04) for alcohol withdrawal and right humerus fracture (12/2016). Course now complicated by VAP and copious secretions.     PULM Acute Hypoxic respiratory failure; VAP pneumonia; Mucous plugging; Pulmonary edema; Hx COPD: - S/p perc trach 10/12; was doing TC trials until developed tachypnea and fever on 10/15.  - Sputum culture (10/15): pseudomonas and staph lugdenesis  P: - Today is day 7 Zosyn. DC 10/23 - TC as tolerated - Continue mucumyst nebs q8hr and atrovent nebs q4hrs to be timed with chest PT. Stop xopenex nebs. - Not a candidate for trach downsize.  - CXR continues to show bibasilar infiltrates and mild pulm edema that is unchanged - Lasix 20mg  IV BID for diuresis    RLL collapse (new on CXR 10/22) - taken pre- mucomyst  and chest PT. O2 sats fine.  - Repeat CXR this afternoon, may need to resume positive pressure ventilation.   Joneen Roach, AGACNP-BC [facility] Pulmonology/Critical Care Pager [phone] or [phone]  02/20/2017 8:26 AM

## 2017-02-20 NOTE — Progress Notes (Signed)
PROGRESS NOTE [facility]   Stuart Mendoza   OIB:[id number] DOB: July 15, 1955  DOA: 02/04/2017 PCP: Patient, No Pcp Per   Brief Narrative:  Stuart Mendoza is a 61 year old male with past medical history of A. fib on Xarelto, COPD, hypertension, diabetes, alcohol abuse who was admitted on 10/6 with large right parietal ICH and IVH, and a 1.7 cm midline shift with hydrocephalus.  Also was found to be in acute hypoxic respiratory failure due to hospital-acquired pneumonia and subsequently intubated. Patient was unable to be weaned off vent, trach was placed. PEG also performed. Remained stable and has been transferred to [facility] care. See significant events below.   Subjective: Patient seen and examined, continues follows some commands, having a lot of secretions.   Assessment & Plan: ICH/IVH PT/OT -need LTAC Neurology sign off, outpatient follow-up in 2 months Need to repeat CAT scan at some point will discuss with neurology Continue thiamine, folic acid and multivitamin  Acute respiratory failure with hypoxia, VAP pneumonia, mucous plugging and pulmonary edema R lung collapse  Status post PERC trach 10/12 Completed course of 9 days of antibiotics Sputum cultures grew Pseudomonas and staph lugdenesis  Patient on Zosyn 6/7 PCCM managing trach and ventilator appreciate recommendations Continue IV Lasix for diuresis for now as patient is NPO  CXR showing R lung collapse, ? Mucous plug. Dicussed with PCCM monitor for now, repeat CXR chest PT and pulmonary toilet.    PAF Off anticoagulation now given intracranial hemorrhage.  Unclear when to restart DOAC, will discuss with neurology.  Patient has been on Dilantin p.o. q. 6-hour heart rate is well controlled.  Continue to monitor  Hypertension blood pressure seems to be stable at this time Continue metoprolol and lisinopril  Diabetes type 2 CBGs seems to be stable Continue SSI  Continue Lantus 25 units  daily  K-Lyte lamented-resolved  Anemia of chronic disease and critical illness Hemoglobin remains stable  DVT prophylaxis: SCD,s  Code Status: Full code  Family Communication: None  Disposition Plan:  ?LTAC  SIGNIFICANT EVENTS: 10/6 Admit, neurology, neurosurgery consulted, kcentra, 3% NS 10/12 remains with reasonable neurostatus; s/p Tracheostomy 10/13: Tolerated ATC All day 10/14: Went back on vent 10/14 PM due to dyspnea.   10/15: minimally responsive which sounds worse than prior baseline last week based on EMR notes. on TC trial has RR 35-40 and thick purulent secretions from Trach. 10/16: fever, pan-cultured, cuff leak so trach exchanged for #6 distal XLT; restart zosyn 10/17: increased tachycardia, thick tan secretions, tmax 98.8, failed SBT secondary to rr in 40's, per RT- multiple mucous plugs requiring bag/lavage 10/18:  HR better controlled, tmax 102.1 but improving, Na improving with free water, following commands on right.  Still having mucous plugging 10/19: PEG placed by Trauma surgery; tolerated 2hrs of PSV 10/20: RT says no further mucous plugs but does have an intermittent cuff leak that is positional (still getting good tidal volumes in 500's despite leak)  Consultants:   PCCM  Neurology   Procedures:  ETT 10/06 >> 10/12 Trach #6 Shiley (DF) 10/12 >> 10/16 Trach #6 Distal XLT 10/16 >> [facility] TLC 10/6 >> 10/15  Antimicrobials: Vancomycin 10/6 >>10/8 Zosyn 10/6 >> 10/7; 10/16 >> CTX 10/8>>> 10/14   Objective: Vitals:   02/20/17 0500 02/20/17 0600 02/20/17 0700 02/20/17 0721  BP: (!) 145/88 (!) 139/91 137/80 137/80  Pulse: 98 96 (!) 106 84  Resp: (!) 30 (!) 35 (!) 31 (!) 34  Temp:      TempSrc:  SpO2: 97% 92% 97% 98%  Weight: 91.5 kg (201 lb 11.5 oz)     Height:        Intake/Output Summary (Last 24 hours) at 02/20/17 0849 Last data filed at 02/20/17 0601  Gross per 24 hour  Intake          1581.08 ml  Output             3225 ml  Net          -1643.92 ml   Filed Weights   02/18/17 0107 02/19/17 0900 02/20/17 0500  Weight: 93.3 kg (205 lb 11 oz) 92.1 kg (203 lb 0.7 oz) 91.5 kg (201 lb 11.5 oz)    Examination: No changes in exam from 02/19/17  General exam: NAD HEENT: Does not open his eyes, trach in place with high output Respiratory system: Coarse breath sounds, good aeration Cardiovascular system: S1 & S2 heard, RRR. No JVD, murmurs, rubs or gallops Gastrointestinal system: Abdomen is nondistended, soft and nontender.  Central nervous system: Respond to some simple commands like we go to but did not open his eyes.  Left side hemiparesis. Extremities: No pedal edema.  Skin: No rashes, lesions or ulcers  Data Reviewed: I have personally reviewed following labs and imaging studies  CBC:  Recent Labs Lab 02/16/17 0319 02/17/17 0411 02/18/17 0404 02/19/17 0341 02/20/17 0248  WBC 11.3* 10.6* 11.1* 10.4 11.6*  NEUTROABS 9.5* 8.8* 9.4* 8.7* 9.4*  HGB 8.7* 8.9* 9.4* 9.9* 10.0*  HCT 30.0* 30.0* 31.4* 31.9* 32.6*  MCV 90.9 89.6 88.5 87.4 87.4  PLT 314 363 440* 417* 423*   Basic Metabolic Panel:  Recent Labs Lab 02/16/17 0319  02/17/17 0411 02/17/17 1540 02/18/17 0404 02/19/17 0341 02/20/17 0248  NA 146*  < > 145 142 139 139 136  K 3.1*  --  3.3* 3.8 3.3* 3.6 3.5  CL 113*  --  111 111 104 105 105  CO2 25  --  25 22 25 25 25   GLUCOSE 237*  --  130* 118* 150* 125* 140*  BUN 25*  --  22* 19 22* 20 23*  CREATININE 0.82  --  0.73 0.77 0.82 0.75 0.84  CALCIUM 8.9  --  9.0 9.0 8.8* 9.0 9.1  MG 2.3  --  2.1  --  2.0 2.0 2.0  PHOS 3.7  --  3.6  --  3.3 3.1 3.1  < > = values in this interval not displayed. GFR: Estimated Creatinine Clearance: 103.7 mL/min (by C-G formula based on SCr of 0.84 mg/dL). Liver Function Tests:  Recent Labs Lab 02/13/17 1419  AST 52*  ALT 70*  ALKPHOS 181*  BILITOT 0.5  PROT 6.0*  ALBUMIN 2.3*   No results for input(s): LIPASE, AMYLASE in the last 168 hours. No results for  input(s): AMMONIA in the last 168 hours. Coagulation Profile: No results for input(s): INR, PROTIME in the last 168 hours. Cardiac Enzymes: No results for input(s): CKTOTAL, CKMB, CKMBINDEX, TROPONINI in the last 168 hours. BNP (last 3 results) No results for input(s): PROBNP in the last 8760 hours. HbA1C: No results for input(s): HGBA1C in the last 72 hours. CBG:  Recent Labs Lab 02/19/17 1551 02/19/17 1927 02/19/17 2328 02/20/17 0306 02/20/17 0750  GLUCAP 154* 154* 182* 140* 135*   Lipid Profile: No results for input(s): CHOL, HDL, LDLCALC, TRIG, CHOLHDL, LDLDIRECT in the last 72 hours. Thyroid Function Tests: No results for input(s): TSH, T4TOTAL, FREET4, T3FREE, THYROIDAB in the last 72 hours. Anemia  Panel: No results for input(s): VITAMINB12, FOLATE, FERRITIN, TIBC, IRON, RETICCTPCT in the last 72 hours. Sepsis Labs:  Recent Labs Lab 02/14/17 1617 02/15/17 0258 02/16/17 0319  PROCALCITON <0.10 <0.10 <0.10  LATICACIDVEN 1.5  --   --     Recent Results (from the past 240 hour(s))  Culture, respiratory (NON-Expectorated)     Status: None   Collection Time: 02/13/17  2:35 PM  Result Value Ref Range Status   Specimen Description TRACHEAL ASPIRATE  Final   Special Requests Normal  Final   Gram Stain   Final    MODERATE WBC PRESENT,BOTH PMN AND MONONUCLEAR FEW GRAM POSITIVE COCCI IN PAIRS RARE GRAM NEGATIVE RODS    Culture   Final    FEW STAPHYLOCOCCUS LUGDUNENSIS RARE PSEUDOMONAS AERUGINOSA    Report Status 02/16/2017 FINAL  Final   Organism ID, Bacteria PSEUDOMONAS AERUGINOSA  Final   Organism ID, Bacteria STAPHYLOCOCCUS LUGDUNENSIS  Final      Susceptibility   Staphylococcus lugdunensis - MIC*    CIPROFLOXACIN <=0.5 SENSITIVE Sensitive     ERYTHROMYCIN <=0.25 SENSITIVE Sensitive     GENTAMICIN <=0.5 SENSITIVE Sensitive     OXACILLIN 2 SENSITIVE Sensitive     TETRACYCLINE <=1 SENSITIVE Sensitive     VANCOMYCIN <=0.5 SENSITIVE Sensitive     TRIMETH/SULFA  <=10 SENSITIVE Sensitive     CLINDAMYCIN <=0.25 SENSITIVE Sensitive     RIFAMPIN <=0.5 SENSITIVE Sensitive     Inducible Clindamycin NEGATIVE Sensitive     * FEW STAPHYLOCOCCUS LUGDUNENSIS   Pseudomonas aeruginosa - MIC*    CEFTAZIDIME 4 SENSITIVE Sensitive     CIPROFLOXACIN <=0.25 SENSITIVE Sensitive     GENTAMICIN <=1 SENSITIVE Sensitive     IMIPENEM 1 SENSITIVE Sensitive     PIP/TAZO 8 SENSITIVE Sensitive     CEFEPIME 2 SENSITIVE Sensitive     * RARE PSEUDOMONAS AERUGINOSA  Culture, blood (routine x 2)     Status: None   Collection Time: 02/14/17 11:00 AM  Result Value Ref Range Status   Specimen Description BLOOD RIGHT HAND  Final   Special Requests IN PEDIATRIC BOTTLE Blood Culture adequate volume  Final   Culture NO GROWTH 5 DAYS  Final   Report Status 02/19/2017 FINAL  Final  Culture, blood (routine x 2)     Status: None   Collection Time: 02/14/17 11:05 AM  Result Value Ref Range Status   Specimen Description BLOOD LEFT HAND  Final   Special Requests IN PEDIATRIC BOTTLE Blood Culture adequate volume  Final   Culture NO GROWTH 5 DAYS  Final   Report Status 02/19/2017 FINAL  Final     Radiology Studies: Dg Chest Port 1 View  Result Date: 02/20/2017 CLINICAL DATA:  Acute respiratory failure EXAM: PORTABLE CHEST 1 VIEW COMPARISON:  01/2027 FINDINGS: Tracheostomy tube unchanged. Patient rotated rightward. Interval increase in RIGHT lower lobe atelectasis. Potential new effusion. LEFT lung clear. Plate fixation of LEFT lateral ribs. IMPRESSION: 1. New RIGHT lower lobe atelectasis. Evaluate for mucous plugging of RIGHT lower lobe bronchus 2. Tracheostomy tube unchanged. These results will be called to the ordering clinician or representative by the Radiologist Assistant, and communication documented in the PACS or zVision Dashboard. Electronically Signed   By: Suzy Bouchard M.D.   On: 02/20/2017 07:58    Scheduled Meds: . acetylcysteine  2 mL Nebulization Q8H  .  chlorhexidine gluconate (MEDLINE KIT)  15 mL Mouth Rinse BID  . diltiazem  60 mg Oral Q6H  . docusate  100  mg Per Tube Daily  . feeding supplement (PRO-STAT SUGAR FREE 64)  30 mL Per Tube BID  . folic acid  1 mg Per Tube Daily  . free water  200 mL Per Tube Q4H  . furosemide  40 mg Intravenous Q12H  . guaiFENesin  10 mL Per Tube Q4H  . heparin subcutaneous  5,000 Units Subcutaneous Q8H  . insulin aspart  0-15 Units Subcutaneous Q4H  . insulin glargine  25 Units Subcutaneous QHS  . ipratropium  0.5 mg Nebulization QID  . lisinopril  20 mg Per Tube Daily  . mouth rinse  15 mL Mouth Rinse Q4H  . metoprolol tartrate  50 mg Per Tube BID  . multivitamin  15 mL Per Tube Daily  . pantoprazole sodium  40 mg Per Tube Q24H  . potassium chloride  40 mEq Per Tube BID  . thiamine  100 mg Per Tube Daily   Continuous Infusions: . diltiazem (CARDIZEM) infusion Stopped (02/18/17 1015)  . feeding supplement (GLUCERNA 1.2 CAL) 1,000 mL (02/20/17 0601)  . piperacillin-tazobactam (ZOSYN)  IV 3.375 g (02/20/17 0601)     LOS: 16 days    Time spent: Total of 15 minutes spent with pt, greater than 50% of which was spent in discussion of  treatment, counseling and coordination of care   Chipper Oman, MD Pager: Text Page via [email or web address]   If 7PM-7AM, please contact night-coverage [email or web address] 02/20/2017, 8:49 AM

## 2017-02-20 NOTE — Progress Notes (Signed)
Nutrition Follow-up  DOCUMENTATION CODES:   Obesity unspecified  INTERVENTION:   Glucerna 1.2 @ 65 ml/hr (1560 ml/day) 30 ml Prostat BID Provides: 2072 (97% of needs), 123 grams protein, and 1263 ml free water.  Total free water: 2463 ml   NUTRITION DIAGNOSIS:   Inadequate oral intake related to inability to eat as evidenced by NPO status. Ongoing.   GOAL:   Patient will meet greater than or equal to 90% of their needs Met.   MONITOR:   TF tolerance, I & O's, Labs  ASSESSMENT:   61 yo male smoker brought to ER with altered mental status.  Found to have larged Rt parietal ICH with IVH and 1.7 cm midline shift with hydrocephalus.  Has hx of a fib on xarelto.  Intubated for airway, and given Kcentra.  Had recent admission (9/29 to 10/04) for alcohol withdrawal.  PMHx COPD, DM, HTN.  Pt discussed during ICU rounds and with RN.  Pt has remained on trach collar <24 hrs, per RN pt with copious secretions 10/19 trach/PEG Per MD notes pt with new RLL collapse, may need additional vent support  Medications reviewed and include: colace, folic acid, lasix, lantus, MVI, KCl, thiamine Free water: 200 ml every 4 hours TF: Glucerna 1.2 @ 65 ml/hr with 30 ml Prostat BID Labs reviewed: CBG"s: 140-135-237   Diet Order:  Diet NPO time specified  Skin:   (non pressure wound buttocks)  Last BM:  10/21 100 ml via rectal pouch  Height:   Ht Readings from Last 1 Encounters:  02/07/17 _0  (1.778 m)    Weight:   Wt Readings from Last 1 Encounters:  02/20/17 201 lb 11.5 oz (91.5 kg)    Ideal Body Weight:  75.4 kg  BMI:  Body mass index is 28.94 kg/m.  Estimated Nutritional Needs:   Kcal:  2100-2300  Protein:  110-125 grams  Fluid:  >2L/day   EDUCATION NEEDS:   Education needs no appropriate at this time  [place], [place], [place] Pager [phone] After Hours Pager

## 2017-02-20 NOTE — Progress Notes (Signed)
Physical Therapy Treatment Patient Details Name: Stuart Mendoza MRN: [id number] DOB: 01-25-1956 Today's Date: 02/20/2017    History of Present Illness 61yo M with Afib (on xarelto), COPD, HTN, DM, and Etoh abuse, presented to ER on 10/6 with AMS and found to have large right parietal ICH with IVH and 1.7 cm midline shift with hydrocephalus. Intubated for airway protection.  Found to have RUL HCAP pneumonia. Recent admitt after fall and humerus fx    PT Comments    Pt remains to have no voluntary movement of L UE and LE, 50% command follow with R UE, L sided neglect, minimal eye opening, and con't to require total assist for all mobility. Pt con't to be appropriate for SNF upon d/c.   Follow Up Recommendations  SNF;Supervision/Assistance - 24 hour     Equipment Recommendations  Hospital bed;Wheelchair (measurements PT);Wheelchair cushion (measurements PT)    Recommendations for Other Services       Precautions / Restrictions Precautions Precautions: Fall;Shoulder Type of Shoulder Precautions: R humerous fracture Shoulder Interventions: Shoulder sling/immobilizer Required Braces or Orthoses: Sling Restrictions Weight Bearing Restrictions: Yes RUE Weight Bearing: Non weight bearing    Mobility  Bed Mobility Overal bed mobility: Needs Assistance Bed Mobility: Supine to Sit;Sit to Supine     Supine to sit: Total assist;+2 for physical assistance Sit to supine: Total assist;+2 for physical assistance   General bed mobility comments: pt with no initiation to either transfer  Transfers                 General transfer comment: unable  Ambulation/Gait             General Gait Details: unable   Stairs            Wheelchair Mobility    Modified Rankin (Stroke Patients Only) Modified Rankin (Stroke Patients Only) Pre-Morbid Rankin Score: No symptoms Modified Rankin: Severe disability     Balance Overall balance assessment: Needs  assistance Sitting-balance support: Feet supported;Single extremity supported Sitting balance-Leahy Scale: Zero Sitting balance - Comments: pt with very strong lean to the L, pt with no protective response or self proprioceoption to midline or that he was leaning to the L. pt sat EOB x 6 min with total assist. Tech was posterior assisting with maintaining cervical extension and PT worked on L stide of trunk with manual stimulation to promote contraction Postural control: Left lateral lean                                  Cognition Arousal/Alertness: Lethargic Behavior During Therapy: Flat affect Overall Cognitive Status: No family/caregiver present to determine baseline cognitive functioning Area of Impairment: Following commands                   Current Attention Level:  (focu)   Following Commands: Follows one step commands with increased time;Follows one step commands inconsistently Safety/Judgement: Decreased awareness of deficits (L sided neglect)     General Comments: pt followed 1 step commands 50% of time with R UE. Pt with 3 episodes of eye opening to name "Alinda Moneyony"      Exercises      General Comments        Pertinent Vitals/Pain Pain Assessment: Faces Faces Pain Scale: No hurt (some grimace to noxious stimuli) Pain Intervention(s): Monitored during session    Home Living  Prior Function            PT Goals (current goals can now be found in the care plan section) Progress towards PT goals: Progressing toward goals    Frequency    Min 2X/week      PT Plan Frequency needs to be updated    Co-evaluation              AM-PAC PT "6 Clicks" Daily Activity  Outcome Measure  Difficulty turning over in bed (including adjusting bedclothes, sheets and blankets)?: Unable Difficulty moving from lying on back to sitting on the side of the bed? : Unable Difficulty sitting down on and standing up from a chair  with arms (e.g., wheelchair, bedside commode, etc,.)?: Unable Help needed moving to and from a bed to chair (including a wheelchair)?: Total Help needed walking in hospital room?: Total Help needed climbing 3-5 steps with a railing? : Total 6 Click Score: 6    End of Session   Activity Tolerance: Patient limited by lethargy Patient left: in bed;with call bell/phone within reach Nurse Communication: Mobility status;Need for lift equipment PT Visit Diagnosis: Other abnormalities of gait and mobility (R26.89);Muscle weakness (generalized) (M62.81);Other symptoms and signs involving the nervous system (R29.898);Hemiplegia and hemiparesis Hemiplegia - Right/Left: Left Hemiplegia - dominant/non-dominant: Non-dominant Hemiplegia - caused by: Nontraumatic intracerebral hemorrhage     Time: 1153-1210 PT Time Calculation (min) (ACUTE ONLY): 17 min  Charges:  $Therapeutic Activity: 8-22 mins                    G Codes:       Stuart Mendoza, PT, DPT Pager #: [phone] Office #: [phone]    Bowen Kia M Yonatan Guitron 02/20/2017, 1:28 PM

## 2017-02-21 LAB — BASIC METABOLIC PANEL
Anion gap: 10 (ref 5–15)
BUN: 25 mg/dL — AB (ref 6–20)
CALCIUM: 9.6 mg/dL (ref 8.9–10.3)
CHLORIDE: 102 mmol/L (ref 101–111)
CO2: 27 mmol/L (ref 22–32)
Creatinine, Ser: 0.86 mg/dL (ref 0.61–1.24)
GFR calc Af Amer: >60 mL/min (ref >60)
GFR calc non Af Amer: >60 mL/min (ref >60)
Glucose, Bld: 144 mg/dL — ABNORMAL HIGH (ref 65–99)
Potassium: 3.5 mmol/L (ref 3.5–5.1)
SODIUM: 139 mmol/L (ref 135–145)

## 2017-02-21 LAB — CBC WITH DIFFERENTIAL/PLATELET
BASOS ABS: 0 10*3/uL (ref 0.0–0.1)
BASOS PCT: 0 %
EOS ABS: 0.1 10*3/uL (ref 0.0–0.7)
Eosinophils Relative: 1 %
HCT: 33.9 % — ABNORMAL LOW (ref 39.0–52.0)
HEMOGLOBIN: 10.4 g/dL — AB (ref 13.0–17.0)
LYMPHS ABS: 1.3 10*3/uL (ref 0.7–4.0)
Lymphocytes Relative: 9 %
MCH: 26.8 pg (ref 26.0–34.0)
MCHC: 30.7 g/dL (ref 30.0–36.0)
MCV: 87.4 fL (ref 78.0–100.0)
Monocytes Absolute: 0.9 10*3/uL (ref 0.1–1.0)
Monocytes Relative: 6 %
NEUTROS PCT: 84 %
Neutro Abs: 12 10*3/uL — ABNORMAL HIGH (ref 1.7–7.7)
Platelets: 553 10*3/uL — ABNORMAL HIGH (ref 150–400)
RBC: 3.88 MIL/uL — AB (ref 4.22–5.81)
RDW: 16.3 % — ABNORMAL HIGH (ref 11.5–15.5)
WBC: 14.3 10*3/uL — AB (ref 4.0–10.5)

## 2017-02-21 LAB — HEPATIC FUNCTION PANEL
ALBUMIN: 2.6 g/dL — AB (ref 3.5–5.0)
ALT: 52 U/L (ref 17–63)
AST: 36 U/L (ref 15–41)
Alkaline Phosphatase: 255 U/L — ABNORMAL HIGH (ref 38–126)
Bilirubin, Direct: 0.2 mg/dL (ref 0.1–0.5)
Indirect Bilirubin: 0.6 mg/dL (ref 0.3–0.9)
TOTAL PROTEIN: 6.6 g/dL (ref 6.5–8.1)
Total Bilirubin: 0.8 mg/dL (ref 0.3–1.2)

## 2017-02-21 LAB — GLUCOSE, CAPILLARY
GLUCOSE-CAPILLARY: 129 mg/dL — AB (ref 65–99)
GLUCOSE-CAPILLARY: 159 mg/dL — AB (ref 65–99)
GLUCOSE-CAPILLARY: 219 mg/dL — AB (ref 65–99)
GLUCOSE-CAPILLARY: 187 mg/dL — AB (ref 65–99)
Glucose-Capillary: 154 mg/dL — ABNORMAL HIGH (ref 65–99)
Glucose-Capillary: 145 mg/dL — ABNORMAL HIGH (ref 65–99)

## 2017-02-21 LAB — PHOSPHORUS: PHOSPHORUS: 3.9 mg/dL (ref 2.5–4.6)

## 2017-02-21 LAB — MAGNESIUM: MAGNESIUM: 2.1 mg/dL (ref 1.7–2.4)

## 2017-02-21 NOTE — Progress Notes (Signed)
Occupational Therapy Treatment Patient Details Name: Stuart Mendoza MRN: [id number] DOB: [birth year] Today's Date: 02/21/2017    History of present illness 61yo M with Afib (on xarelto), COPD, HTN, DM, and Etoh abuse, presented to ER on 10/6 with AMS and found to have large right parietal ICH with IVH and 1.7 cm midline shift with hydrocephalus. Intubated for airway protection.  Found to have RUL HCAP pneumonia. Recent admitt after fall and humerus fx   OT comments  Pt with eyes open upon entry, but not responsive to commands and eyes closing shortly after arrival.  Pt grimacing with bed mobility and positioning of head in neutral. Slightly increased flexor tone in L elbow noted. Continues to require total assist for all mobility and ADL.  Follow Up Recommendations  SNF;Supervision/Assistance - 24 hour    Equipment Recommendations       Recommendations for Other Services      Precautions / Restrictions Precautions Precautions: Fall;Shoulder Type of Shoulder Precautions: R humerus fracture Precaution Comments: trach, peg Restrictions RUE Weight Bearing: Non weight bearing Other Position/Activity Restrictions: assume NWB RUE (comminuted humerus fx)       Mobility Bed Mobility Overal bed mobility: Needs Assistance Bed Mobility: Rolling Rolling: Total assist         General bed mobility comments: for positioning and weight bearing on L shoulder  Transfers                 General transfer comment: unable    Balance                                           ADL either performed or assessed with clinical judgement   ADL                                         General ADL Comments: requires total assist     Vision       Perception     Praxis      Cognition Arousal/Alertness: Lethargic Behavior During Therapy: Flat affect Overall Cognitive Status: Impaired/Different from baseline Area of Impairment: Following  commands;Attention                   Current Attention Level: Focused   Following Commands:  (not following commands this visit)       General Comments: eyes open upon arrival, but then returned to closed and did not reopen        Exercises     Shoulder Instructions       General Comments      Pertinent Vitals/ Pain       Pain Assessment: Faces Faces Pain Scale: No hurt  Home Living                                          Prior Functioning/Environment              Frequency  Min 3X/week        Progress Toward Goals  OT Goals(current goals can now be found in the care plan section)  Progress towards OT goals: Not progressing toward goals - comment (lethargy)  Acute Rehab OT Goals OT Goal Formulation:  Patient unable to participate in goal setting Time For Goal Achievement: 03/03/17 Potential to Achieve Goals: Fair  Plan Discharge plan remains appropriate    Co-evaluation                 AM-PAC PT "6 Clicks" Daily Activity     Outcome Measure   Help from another person eating meals?: Total Help from another person taking care of personal grooming?: Total Help from another person toileting, which includes using toliet, bedpan, or urinal?: Total Help from another person bathing (including washing, rinsing, drying)?: Total Help from another person to put on and taking off regular upper body clothing?: Total Help from another person to put on and taking off regular lower body clothing?: Total 6 Click Score: 6    End of Session    OT Visit Diagnosis: Muscle weakness (generalized) (M62.81)   Activity Tolerance     Patient Left in bed;with call bell/phone within reach;with bed alarm set   Nurse Communication          Time: 1330-1350 OT Time Calculation (min): 20 min  Charges: OT General Charges $OT Visit: 1 Visit OT Treatments $Therapeutic Activity: 8-22 mins  02/21/2017 Stuart Mendoza, OTR/L Pager:  [phone]   Stuart Mendoza, Stuart Mendoza 02/21/2017, 1:56 PM

## 2017-02-21 NOTE — Progress Notes (Signed)
PROGRESS NOTE [facility]   Stuart Mendoza   WCH:[id number] DOB: 1955/12/26  DOA: 02/04/2017 PCP: Patient, No Pcp Per   Brief Narrative:  Stuart Mendoza is a 61 year old male with past medical history of A. fib on Xarelto, COPD, hypertension, diabetes, alcohol abuse who was admitted on 10/6 with large right parietal ICH and IVH, and a 1.7 cm midline shift with hydrocephalus.  Also was found to be in acute hypoxic respiratory failure due to hospital-acquired pneumonia and subsequently intubated. Patient was unable to be weaned off vent, trach was placed. PEG also performed. Remained stable and has been transferred to [facility] for further care. See significant events below.   Subjective: Patient seen and examined, minimally responsive today. Not following any commands   Assessment & Plan: ICH/IVH PT/OT -need LTAC Neurology sign off, outpatient follow-up in 2 months Case discussed with Neurology Dr Erlinda Hong, get MRI in 6 weeks from the day of admission.  Continue thiamine, folic acid and multivitamin  Acute respiratory failure with hypoxia, VAP pneumonia, mucous plugging and pulmonary edema R lung collapse  Status post PERC trach 10/12 Completed course of 9 days of antibiotics Sputum cultures grew Pseudomonas and staph lugdenesis  Completed course of Zosyn 7 days  Off ventilator, Trach management per PCCM  Patient continue on IV Lasix, maintain negative balance  Atelectasis improving, continue chest PT and pulmonary toilet.   PAF Off anticoagulation now given intracranial hemorrhage. Neurology recommending hold a/c until MRI is repeated in 6 weeks. Patient has been on Dilantin p.o. q. 6-hour heart rate is well controlled. Continue to monitor  Hypertension blood pressure seems to be stable at this time Continue metoprolol and lisinopril  Diabetes type 2 CBGs seems to be stable Continue SSI  Continue Lantus 25 units daily  Hypernatrema/Hypokalemia -resolved Monitor BMP    Anemia of chronic disease and critical illness Hemoglobin remains stable  DVT prophylaxis: SCD,s  Code Status: Full code  Family Communication: None  Disposition Plan:  Hopefully LTAC soon   SIGNIFICANT EVENTS: 10/6 Admit, neurology, neurosurgery consulted, kcentra, 3% NS 10/12 remains with reasonable neurostatus; s/p Tracheostomy 10/13: Tolerated ATC All day 10/14: Went back on vent 10/14 PM due to dyspnea.   10/15: minimally responsive which sounds worse than prior baseline last week based on EMR notes. on TC trial has RR 35-40 and thick purulent secretions from Trach. 10/16: fever, pan-cultured, cuff leak so trach exchanged for #6 distal XLT; restart zosyn 10/17: increased tachycardia, thick tan secretions, tmax 98.8, failed SBT secondary to rr in 40's, per RT- multiple mucous plugs requiring bag/lavage 10/18:  HR better controlled, tmax 102.1 but improving, Na improving with free water, following commands on right.  Still having mucous plugging 10/19: PEG placed by Trauma surgery; tolerated 2hrs of PSV 10/20: RT says no further mucous plugs but does have an intermittent cuff leak that is positional (still getting good tidal volumes in 500's despite leak)  Consultants:   PCCM  Neurology   Procedures:  ETT 10/06 >> 10/12 Trach #6 Shiley (DF) 10/12 >> 10/16 Trach #6 Distal XLT 10/16 >> LSC TLC 10/6 >> 10/15  Antimicrobials: Vancomycin 10/6 >>10/8 Zosyn 10/6 >> 10/7; 10/16 >> CTX 10/8>>> 10/14   Objective: Vitals:   02/21/17 0500 02/21/17 0814 02/21/17 0838 02/21/17 0842  BP: (!) 119/91 136/83  136/83  Pulse: 93 100  95  Resp: (!) 26 (!) 28    Temp:  98.8 F (37.1 C)    TempSrc:  Axillary    SpO2: 97% 98%  97% 95%  Weight: 89.7 kg (197 lb 12 oz)     Height:        Intake/Output Summary (Last 24 hours) at 02/21/17 0922 Last data filed at 02/21/17 0500  Gross per 24 hour  Intake              455 ml  Output             3775 ml  Net            -3320 ml    Filed Weights   02/19/17 0900 02/20/17 0500 02/21/17 0500  Weight: 92.1 kg (203 lb 0.7 oz) 91.5 kg (201 lb 11.5 oz) 89.7 kg (197 lb 12 oz)    Examination:   General: Minimally responsive  Cardiovascular: RRR, S1/S2 +, no rubs, no gallops Respiratory: Coarse breath sounds, good air entry Abdominal: Soft, NT, ND Neuro: Not opening eyes, localize pain, follow simple commands Extremities: no edema.   Data Reviewed: I have personally reviewed following labs and imaging studies  CBC:  Recent Labs Lab 02/17/17 0411 02/18/17 0404 02/19/17 0341 02/20/17 0248 02/21/17 0503  WBC 10.6* 11.1* 10.4 11.6* 14.3*  NEUTROABS 8.8* 9.4* 8.7* 9.4* 12.0*  HGB 8.9* 9.4* 9.9* 10.0* 10.4*  HCT 30.0* 31.4* 31.9* 32.6* 33.9*  MCV 89.6 88.5 87.4 87.4 87.4  PLT 363 440* 417* 548* 122*   Basic Metabolic Panel:  Recent Labs Lab 02/17/17 0411 02/17/17 1540 02/18/17 0404 02/19/17 0341 02/20/17 0248 02/21/17 0503  NA 145 142 139 139 136 139  K 3.3* 3.8 3.3* 3.6 3.5 3.5  CL 111 111 104 105 105 102  CO2 25 22 25 25 25 27   GLUCOSE 130* 118* 150* 125* 140* 144*  BUN 22* 19 22* 20 23* 25*  CREATININE 0.73 0.77 0.82 0.75 0.84 0.86  CALCIUM 9.0 9.0 8.8* 9.0 9.1 9.6  MG 2.1  --  2.0 2.0 2.0 2.1  PHOS 3.6  --  3.3 3.1 3.1 3.9   GFR: Estimated Creatinine Clearance: 100.4 mL/min (by C-G formula based on SCr of 0.86 mg/dL). Liver Function Tests:  Recent Labs Lab 02/21/17 0503  AST 36  ALT 52  ALKPHOS 255*  BILITOT 0.8  PROT 6.6  ALBUMIN 2.6*   No results for input(s): LIPASE, AMYLASE in the last 168 hours. No results for input(s): AMMONIA in the last 168 hours. Coagulation Profile: No results for input(s): INR, PROTIME in the last 168 hours. Cardiac Enzymes: No results for input(s): CKTOTAL, CKMB, CKMBINDEX, TROPONINI in the last 168 hours. BNP (last 3 results) No results for input(s): PROBNP in the last 8760 hours. HbA1C: No results for input(s): HGBA1C in the last 72  hours. CBG:  Recent Labs Lab 02/20/17 1552 02/20/17 1940 02/20/17 2342 02/21/17 0322 02/21/17 0811  GLUCAP 125* 115* 206* 129* 159*   Lipid Profile: No results for input(s): CHOL, HDL, LDLCALC, TRIG, CHOLHDL, LDLDIRECT in the last 72 hours. Thyroid Function Tests: No results for input(s): TSH, T4TOTAL, FREET4, T3FREE, THYROIDAB in the last 72 hours. Anemia Panel: No results for input(s): VITAMINB12, FOLATE, FERRITIN, TIBC, IRON, RETICCTPCT in the last 72 hours. Sepsis Labs:  Recent Labs Lab 02/14/17 1617 02/15/17 0258 02/16/17 0319  PROCALCITON <0.10 <0.10 <0.10  LATICACIDVEN 1.5  --   --     Recent Results (from the past 240 hour(s))  Culture, respiratory (NON-Expectorated)     Status: None   Collection Time: 02/13/17  2:35 PM  Result Value Ref Range Status   Specimen Description TRACHEAL  ASPIRATE  Final   Special Requests Normal  Final   Gram Stain   Final    MODERATE WBC PRESENT,BOTH PMN AND MONONUCLEAR FEW GRAM POSITIVE COCCI IN PAIRS RARE GRAM NEGATIVE RODS    Culture   Final    FEW STAPHYLOCOCCUS LUGDUNENSIS RARE PSEUDOMONAS AERUGINOSA    Report Status 02/16/2017 FINAL  Final   Organism ID, Bacteria PSEUDOMONAS AERUGINOSA  Final   Organism ID, Bacteria STAPHYLOCOCCUS LUGDUNENSIS  Final      Susceptibility   Staphylococcus lugdunensis - MIC*    CIPROFLOXACIN <=0.5 SENSITIVE Sensitive     ERYTHROMYCIN <=0.25 SENSITIVE Sensitive     GENTAMICIN <=0.5 SENSITIVE Sensitive     OXACILLIN 2 SENSITIVE Sensitive     TETRACYCLINE <=1 SENSITIVE Sensitive     VANCOMYCIN <=0.5 SENSITIVE Sensitive     TRIMETH/SULFA <=10 SENSITIVE Sensitive     CLINDAMYCIN <=0.25 SENSITIVE Sensitive     RIFAMPIN <=0.5 SENSITIVE Sensitive     Inducible Clindamycin NEGATIVE Sensitive     * FEW STAPHYLOCOCCUS LUGDUNENSIS   Pseudomonas aeruginosa - MIC*    CEFTAZIDIME 4 SENSITIVE Sensitive     CIPROFLOXACIN <=0.25 SENSITIVE Sensitive     GENTAMICIN <=1 SENSITIVE Sensitive     IMIPENEM  1 SENSITIVE Sensitive     PIP/TAZO 8 SENSITIVE Sensitive     CEFEPIME 2 SENSITIVE Sensitive     * RARE PSEUDOMONAS AERUGINOSA  Culture, blood (routine x 2)     Status: None   Collection Time: 02/14/17 11:00 AM  Result Value Ref Range Status   Specimen Description BLOOD RIGHT HAND  Final   Special Requests IN PEDIATRIC BOTTLE Blood Culture adequate volume  Final   Culture NO GROWTH 5 DAYS  Final   Report Status 02/19/2017 FINAL  Final  Culture, blood (routine x 2)     Status: None   Collection Time: 02/14/17 11:05 AM  Result Value Ref Range Status   Specimen Description BLOOD LEFT HAND  Final   Special Requests IN PEDIATRIC BOTTLE Blood Culture adequate volume  Final   Culture NO GROWTH 5 DAYS  Final   Report Status 02/19/2017 FINAL  Final     Radiology Studies: Dg Chest Port 1 View  Result Date: 02/20/2017 CLINICAL DATA:  Atelectasis EXAM: PORTABLE CHEST 1 VIEW COMPARISON:  Plain film from earlier same day. FINDINGS: Significantly improved aeration at the right lung base. Probable mild atelectasis at the left lung base. Heart size and mediastinal contours appear stable. Tracheostomy tube remains appropriately positioned at the midline. No pneumothorax seen. IMPRESSION: Significantly improved aeration at the right lung base, presumably resolving atelectasis. Electronically Signed   By: Franki Cabot M.D.   On: 02/20/2017 19:45   Dg Chest Port 1 View  Result Date: 02/20/2017 CLINICAL DATA:  Acute respiratory failure EXAM: PORTABLE CHEST 1 VIEW COMPARISON:  01/2027 FINDINGS: Tracheostomy tube unchanged. Patient rotated rightward. Interval increase in RIGHT lower lobe atelectasis. Potential new effusion. LEFT lung clear. Plate fixation of LEFT lateral ribs. IMPRESSION: 1. New RIGHT lower lobe atelectasis. Evaluate for mucous plugging of RIGHT lower lobe bronchus 2. Tracheostomy tube unchanged. These results will be called to the ordering clinician or representative by the Radiologist  Assistant, and communication documented in the PACS or zVision Dashboard. Electronically Signed   By: Suzy Bouchard M.D.   On: 02/20/2017 07:58    Scheduled Meds: . acetylcysteine  2 mL Nebulization Q8H  . chlorhexidine gluconate (MEDLINE KIT)  15 mL Mouth Rinse BID  . diltiazem  60  mg Oral Q6H  . docusate  100 mg Per Tube Daily  . feeding supplement (PRO-STAT SUGAR FREE 64)  30 mL Per Tube BID  . folic acid  1 mg Per Tube Daily  . free water  200 mL Per Tube Q4H  . furosemide  40 mg Intravenous Q12H  . guaiFENesin  10 mL Per Tube Q4H  . heparin subcutaneous  5,000 Units Subcutaneous Q8H  . insulin aspart  0-15 Units Subcutaneous Q4H  . insulin glargine  25 Units Subcutaneous QHS  . ipratropium  0.5 mg Nebulization QID  . lisinopril  20 mg Per Tube Daily  . mouth rinse  15 mL Mouth Rinse Q4H  . metoprolol tartrate  50 mg Per Tube BID  . multivitamin  15 mL Per Tube Daily  . pantoprazole sodium  40 mg Per Tube Q24H  . potassium chloride  40 mEq Per Tube BID  . thiamine  100 mg Per Tube Daily   Continuous Infusions: . diltiazem (CARDIZEM) infusion Stopped (02/18/17 1015)  . feeding supplement (GLUCERNA 1.2 CAL) 1,000 mL (02/20/17 1600)  . piperacillin-tazobactam (ZOSYN)  IV 3.375 g (02/21/17 0627)     LOS: 17 days    Time spent: Total of 15 minutes spent with pt, greater than 50% of which was spent in discussion of  treatment, counseling and coordination of care   Chipper Oman, MD Pager: Text Page via [email or web address]   If 7PM-7AM, please contact night-coverage [email or web address] 02/21/2017, 9:22 AM

## 2017-02-21 NOTE — Care Management Important Message (Signed)
Important Message  Patient Details  Name: [name] MRN: [id number] Date of Birth: 1956/01/14   Medicare Important Message Given:  Yes    Stuart Mendoza 02/21/2017, 9:05 AM

## 2017-02-21 NOTE — Plan of Care (Signed)
Problem: Education: Goal: Knowledge of secondary prevention will improve Outcome: Not Progressing Unable to measure patients knowledge of disease process Goal: Knowledge of patient specific risk factors addressed and post discharge goals established will improve Outcome: Not Progressing Unable to measure patients knowledge  Problem: Health Behavior/Discharge Planning: Goal: Ability to manage health-related needs will improve Outcome: Not Progressing Patient unable to verbalize needs and unable to care for self  Problem: Self-Care: Goal: Ability to participate in self-care as condition permits will improve Outcome: Not Progressing Patient unable to participate in care Goal: Verbalization of feelings and concerns over difficulty with self-care will improve Outcome: Not Progressing Unable to communicate   Problem: Nutrition: Goal: Dietary intake will improve Outcome: Progressing Tube feeding in place  Problem: Role Relationship: Goal: Method of communication will improve Outcome: Not Progressing Unable to communicate   Problem: Education: Goal: Knowledge of [facility] Education information/materials will improve Outcome: Progressing Unable to measure patients knowledge   Problem: Health Behavior/Discharge Planning: Goal: Ability to manage health-related needs will improve Outcome: Not Progressing Unable to manage personal needs

## 2017-02-22 ENCOUNTER — Inpatient Hospital Stay (HOSPITAL_COMMUNITY): Payer: Medicare Other

## 2017-02-22 DIAGNOSIS — J95851 Ventilator associated pneumonia: Secondary | ICD-10-CM

## 2017-02-22 DIAGNOSIS — Z930 Tracheostomy status: Secondary | ICD-10-CM

## 2017-02-22 DIAGNOSIS — R509 Fever, unspecified: Secondary | ICD-10-CM

## 2017-02-22 LAB — CBC WITH DIFFERENTIAL/PLATELET
BASOS ABS: 0 10*3/uL (ref 0.0–0.1)
BASOS PCT: 0 %
Eosinophils Absolute: 0.2 10*3/uL (ref 0.0–0.7)
Eosinophils Relative: 1 %
HEMATOCRIT: 34.8 % — AB (ref 39.0–52.0)
HEMOGLOBIN: 10.8 g/dL — AB (ref 13.0–17.0)
LYMPHS PCT: 10 %
Lymphs Abs: 1.5 10*3/uL (ref 0.7–4.0)
MCH: 27.3 pg (ref 26.0–34.0)
MCHC: 31 g/dL (ref 30.0–36.0)
MCV: 87.9 fL (ref 78.0–100.0)
MONOS PCT: 7 %
Monocytes Absolute: 1.1 10*3/uL — ABNORMAL HIGH (ref 0.1–1.0)
NEUTROS ABS: 12.4 10*3/uL — AB (ref 1.7–7.7)
NEUTROS PCT: 82 %
Platelets: 592 10*3/uL — ABNORMAL HIGH (ref 150–400)
RBC: 3.96 MIL/uL — ABNORMAL LOW (ref 4.22–5.81)
RDW: 16.5 % — ABNORMAL HIGH (ref 11.5–15.5)
WBC: 15.1 10*3/uL — ABNORMAL HIGH (ref 4.0–10.5)

## 2017-02-22 LAB — BASIC METABOLIC PANEL
ANION GAP: 12 (ref 5–15)
BUN: 31 mg/dL — ABNORMAL HIGH (ref 6–20)
CHLORIDE: 103 mmol/L (ref 101–111)
CO2: 26 mmol/L (ref 22–32)
Calcium: 9.9 mg/dL (ref 8.9–10.3)
Creatinine, Ser: 0.96 mg/dL (ref 0.61–1.24)
GFR calc Af Amer: >60 mL/min (ref >60)
Glucose, Bld: 123 mg/dL — ABNORMAL HIGH (ref 65–99)
POTASSIUM: 4.2 mmol/L (ref 3.5–5.1)
SODIUM: 141 mmol/L (ref 135–145)

## 2017-02-22 LAB — GLUCOSE, CAPILLARY
GLUCOSE-CAPILLARY: 116 mg/dL — AB (ref 65–99)
GLUCOSE-CAPILLARY: 129 mg/dL — AB (ref 65–99)
GLUCOSE-CAPILLARY: 154 mg/dL — AB (ref 65–99)
GLUCOSE-CAPILLARY: 202 mg/dL — AB (ref 65–99)
GLUCOSE-CAPILLARY: 224 mg/dL — AB (ref 65–99)
Glucose-Capillary: 177 mg/dL — ABNORMAL HIGH (ref 65–99)

## 2017-02-22 LAB — PHOSPHORUS: PHOSPHORUS: 4 mg/dL (ref 2.5–4.6)

## 2017-02-22 LAB — MAGNESIUM: Magnesium: 2.2 mg/dL (ref 1.7–2.4)

## 2017-02-22 MED ORDER — FUROSEMIDE 10 MG/ML IJ SOLN
40.0000 mg | Freq: Every day | INTRAMUSCULAR | Status: DC
  Administered 2017-02-23: 40 mg via INTRAVENOUS
  Filled 2017-02-22 (×2): qty 4

## 2017-02-22 MED ORDER — FREE WATER
200.0000 mL | Freq: Three times a day (TID) | Status: DC
  Administered 2017-02-22 – 2017-02-23 (×3): 200 mL

## 2017-02-22 NOTE — Progress Notes (Signed)
Physical Therapy Treatment Patient Details Name: Stuart FuelCharles Mendoza MRN: [id number] DOB: 1956-02-06 Today's Date: 02/22/2017    History of Present Illness 61yo M with Afib (on xarelto), COPD, HTN, DM, and Etoh abuse, presented to ER on 10/6 with AMS and found to have large right parietal ICH with IVH and 1.7 cm midline shift with hydrocephalus. Intubated for airway protection.  Found to have RUL HCAP pneumonia. Recent admitt after fall and humerus fx    PT Comments    Limited progress due to severity of deficits.   Follow Up Recommendations  SNF;Supervision/Assistance - 24 hour     Equipment Recommendations  Hospital bed;Wheelchair (measurements PT);Wheelchair cushion (measurements PT)    Recommendations for Other Services       Precautions / Restrictions Precautions Precautions: Fall;Shoulder Type of Shoulder Precautions: R humerus fracture Shoulder Interventions: Shoulder sling/immobilizer (not present in room) Precaution Booklet Issued: No Precaution Comments: trach, peg Required Braces or Orthoses: Sling (not present in room) Restrictions Weight Bearing Restrictions: Yes RUE Weight Bearing: Non weight bearing    Mobility  Bed Mobility Overal bed mobility: Needs Assistance Bed Mobility: Rolling;Supine to Sit;Sit to Supine Rolling: Total assist;+2 for physical assistance   Supine to sit: Total assist;+2 for physical assistance Sit to supine: Total assist;+2 for physical assistance   General bed mobility comments: Assist for all aspects  Transfers                 General transfer comment: unable  Ambulation/Gait                 Stairs            Wheelchair Mobility    Modified Rankin (Stroke Patients Only) Modified Rankin (Stroke Patients Only) Pre-Morbid Rankin Score: No symptoms Modified Rankin: Severe disability     Balance Overall balance assessment: Needs assistance Sitting-balance support: Feet supported;No upper extremity  supported Sitting balance-Leahy Scale: Zero Sitting balance - Comments: Pt with posterior left lean in sitting, required total A/max A to maintain. Pt sat EOB x 10 minutes. No balance reactions noted. Postural control: Posterior lean;Left lateral lean                                  Cognition Arousal/Alertness: Lethargic Behavior During Therapy: Flat affect Overall Cognitive Status: No family/caregiver present to determine baseline cognitive functioning Area of Impairment: Following commands;Attention                   Current Attention Level: Focused   Following Commands: Follows one step commands with increased time;Follows one step commands inconsistently       General Comments: brief periods of arousal with sitting with verbal/tactile stimuli. Followed command to "squeeze my hand" twice during session. No other commands followed      Exercises      General Comments        Pertinent Vitals/Pain Pain Assessment: Faces Faces Pain Scale: No hurt Pain Intervention(s): Monitored during session    Home Living                      Prior Function            PT Goals (current goals can now be found in the care plan section) Acute Rehab PT Goals Patient Stated Goal: none stated this session Progress towards PT goals: Not progressing toward goals - comment    Frequency  Min 2X/week      PT Plan Current plan remains appropriate    Co-evaluation PT/OT/SLP Co-Evaluation/Treatment: Yes Reason for Co-Treatment: Complexity of the patient's impairments (multi-system involvement);For patient/therapist safety PT goals addressed during session: Balance;Mobility/safety with mobility OT goals addressed during session: Strengthening/ROM;ADL's and self-care      AM-PAC PT "6 Clicks" Daily Activity  Outcome Measure  Difficulty turning over in bed (including adjusting bedclothes, sheets and blankets)?: Unable Difficulty moving from lying on  back to sitting on the side of the bed? : Unable Difficulty sitting down on and standing up from a chair with arms (e.g., wheelchair, bedside commode, etc,.)?: Unable Help needed moving to and from a bed to chair (including a wheelchair)?: Total Help needed walking in hospital room?: Total Help needed climbing 3-5 steps with a railing? : Total 6 Click Score: 6    End of Session Equipment Utilized During Treatment: Oxygen (trach collar) Activity Tolerance: Patient limited by lethargy Patient left: in bed;with call bell/phone within reach Nurse Communication: Mobility status PT Visit Diagnosis: Other abnormalities of gait and mobility (R26.89);Hemiplegia and hemiparesis Hemiplegia - Right/Left: Left Hemiplegia - dominant/non-dominant: Non-dominant Hemiplegia - caused by: Nontraumatic intracerebral hemorrhage     Time: 1610-9604 PT Time Calculation (min) (ACUTE ONLY): 28 min  Charges:  $Therapeutic Activity: 8-22 mins                    G Codes:       [facility] PT [phone]    Angelina Ok [facility] 02/22/2017, 3:57 PM

## 2017-02-22 NOTE — Progress Notes (Signed)
Occupational Therapy Treatment Patient Details Name: Stuart Mendoza MRN: [id number] DOB: 1955/11/20 Today's Date: 02/22/2017    History of present illness 61yo M with Afib (on xarelto), COPD, HTN, DM, and Etoh abuse, presented to ER on 10/6 with AMS and found to have large right parietal ICH with IVH and 1.7 cm midline shift with hydrocephalus. Intubated for airway protection.  Found to have RUL HCAP pneumonia. Recent admitt after fall and humerus fx   OT comments  Pt seen with PT to see if Pt would arouse going from supine to sit. Pt remains total A for ADL, +2 for bed mobility, and Pt unable to follow one step directions consistently - even with multimodal cues. Pt with no facial recognition with pain to RUE/shoulder even with bed mobility (special care taken to support and NWB through RUE). OT will continue to follow acutely; SNF continues to be appropriate discharge recommendation.   Follow Up Recommendations  SNF;Supervision/Assistance - 24 hour    Equipment Recommendations  Other (comment) (defer to next venue)    Recommendations for Other Services      Precautions / Restrictions Precautions Precautions: Fall;Shoulder Type of Shoulder Precautions: R humerus fracture Shoulder Interventions: Shoulder sling/immobilizer (not present in room) Precaution Booklet Issued: No Precaution Comments: trach, peg Required Braces or Orthoses: Sling (not present in room) Restrictions Weight Bearing Restrictions: Yes RUE Weight Bearing: Non weight bearing       Mobility Bed Mobility Overal bed mobility: Needs Assistance Bed Mobility: Rolling;Supine to Sit;Sidelying to Sit Rolling: Total assist;+2 for physical assistance   Supine to sit: Total assist;+2 for physical assistance Sit to supine: Total assist;+2 for physical assistance      Transfers                 General transfer comment: unable    Balance Overall balance assessment: Needs assistance Sitting-balance  support: Feet supported;No upper extremity supported Sitting balance-Leahy Scale: Zero Sitting balance - Comments: Pt with posterior left lean in sitting, required total A/max A to maintain Postural control: Posterior lean;Left lateral lean                                 ADL either performed or assessed with clinical judgement   ADL Overall ADL's : Needs assistance/impaired                                       General ADL Comments: requires total A for all ADL     Vision   Additional Comments: Pt unable to follow commands for visual testing   Perception     Praxis      Cognition Arousal/Alertness: Lethargic Behavior During Therapy: Flat affect Overall Cognitive Status: No family/caregiver present to determine baseline cognitive functioning Area of Impairment: Following commands;Attention                   Current Attention Level: Focused   Following Commands: Follows one step commands with increased time;Follows one step commands inconsistently       General Comments: did not rouse during session, no grimacing noted with RUE - carefully guarded it during bed mobility        Exercises     Shoulder Instructions       General Comments      Pertinent Vitals/ Pain  Pain Assessment: Faces Faces Pain Scale: No hurt Pain Intervention(s): Monitored during session  Home Living                                          Prior Functioning/Environment              Frequency  Min 2X/week        Progress Toward Goals  OT Goals(current goals can now be found in the care plan section)  Progress towards OT goals: Progressing toward goals  Acute Rehab OT Goals Patient Stated Goal: none stated this session OT Goal Formulation: Patient unable to participate in goal setting  Plan Discharge plan remains appropriate;Frequency needs to be updated    Co-evaluation    PT/OT/SLP Co-Evaluation/Treatment:  Yes Reason for Co-Treatment: Complexity of the patient's impairments (multi-system involvement);Necessary to address cognition/behavior during functional activity;For patient/therapist safety;To address functional/ADL transfers   OT goals addressed during session: Strengthening/ROM;ADL's and self-care      AM-PAC PT "6 Clicks" Daily Activity     Outcome Measure   Help from another person eating meals?: Total Help from another person taking care of personal grooming?: Total Help from another person toileting, which includes using toliet, bedpan, or urinal?: Total Help from another person bathing (including washing, rinsing, drying)?: Total Help from another person to put on and taking off regular upper body clothing?: Total Help from another person to put on and taking off regular lower body clothing?: Total 6 Click Score: 6    End of Session Equipment Utilized During Treatment: Oxygen  OT Visit Diagnosis: Muscle weakness (generalized) (M62.81);Other symptoms and signs involving cognitive function;Other symptoms and signs involving the nervous system (R29.898)   Activity Tolerance Patient limited by lethargy   Patient Left in bed;with call bell/phone within reach;with bed alarm set   Nurse Communication Mobility status        Time: [id number] OT Time Calculation (min): 26 min  Charges: OT General Charges $OT Visit: 1 Visit OT Treatments $Therapeutic Activity: 8-22 mins  Sherryl MangesLaura Kawena Lyday OTR/L [phone]  Evern BioLaura J Allye Hoyos 02/22/2017, 3:28 PM

## 2017-02-22 NOTE — Plan of Care (Signed)
Problem: Self-Care: Goal: Ability to participate in self-care as condition permits will improve Outcome: Not Progressing No change in patient's mental status.  He continues to open his eyes and follow commands intermittently.

## 2017-02-22 NOTE — Progress Notes (Signed)
PROGRESS NOTE    Stuart Mendoza  HGD:[id number] DOB: 29-Jan-1956 DOA: 02/04/2017 PCP: Patient, No Pcp Per   Brief Narrative: 61 year old male with past medical history of A. fib on Xarelto, COPD, hypertension, diabetes, alcohol abuse who was admitted on 10/6 with large right parietal ICH and IVH, and a 1.7 cm midline shift with hydrocephalus.  Also was found to be in acute hypoxic respiratory failure due to hospital-acquired pneumonia and subsequently intubated. Patient was unable to be weaned off vent, trach was placed. PEG also performed. Remained stable and has been transferred to [facility] for further care.   SIGNIFICANT EVENTS: 10/6 Admit, neurology, neurosurgery consulted, kcentra, 3% NS 10/12 remains with reasonable neurostatus; s/p Tracheostomy 10/13: Tolerated ATC All day 10/14: Went back on vent 10/14 PM due to dyspnea.  10/15: minimally responsive which sounds worse than prior baseline last week based on EMR notes. on TC trial has RR 35-40 and thick purulent secretions from Trach. 10/16: fever, pan-cultured, cuff leak so trach exchanged for #6 distal XLT; restart zosyn 10/17: increased tachycardia, thick tan secretions, tmax 98.8, failed SBT secondary to rr in 40's, per RT- multiple mucous plugs requiring bag/lavage 10/18: HR better controlled, tmax 102.1 but improving, Na improving with free water, following commands on right. Still having mucous plugging 10/19: PEG placed by Trauma surgery; tolerated 2hrs of PSV 10/20: RT says no further mucous plugs but does have an intermittent cuff leak that is positional (still getting good tidal volumes in 500's despite leak)  Assessment & Plan:  # ICH/IVH: -Seen by neurologist, recommended MRI in 6 weeks and outpatient follow-up. -PT OT therapy. Patient likely needs LTAC or SNF on discharge -he opens eyes nonverbal.  # Acute respiratory failure with hypoxia/HCAP, mucous plugging and acute pulmonary edema -Status post trach  10/12 -Sputum cultures grew Pseudomonas and staph lugdenesis, completed abx course.Zosyn for 7 days. -Off ventilator, Trach management per PCCM  -Patient continue on IV Lasix, maintain negative balance, change to daily dose -Atelectasis improving, continue chest PT and pulmonary toilet.   # PAF -Off anticoagulation now given intracranial hemorrhage. Neurology recommending hold a/c until MRI is repeated in 6 weeks. Patient has been on Diltiazem through tube feed q. 6-hour. Monitor HR  #Hypertension: continue metoprolol, lisinopril, diltiazem and Lasix. Monitor blood pressure.  #Diabetes type 2: continue current insulin regimen.Monitor blood sugar level.  # Hypernatrema/Hypokalemia -resolved Monitor BMP   # Anemia of chronic disease and critical illness Hemoglobin remains stable.  # mild fever: Chest x-ray with no acute finding. Monitor fever curve. Patient completed antibiotics course. Tylenol as needed.  # goals of care: Patient is not able to participate in discussion. His in nonverbal. He has prolonged hospitalization with multiple comorbidities. I consulted palliative care for goals of care discussion.  DVT prophylaxis: SCD Code Status:full code Family Communication:no family at bedside Disposition Plan:urrently admitted    Consultants:   PCCM  Neurology   Palliative care  Procedures: ETT 10/06 >> 10/12 Trach #6 Shiley (DF) 10/12 >> 10/16 Trach #6 Distal XLT 10/16 >> LSC TLC 10/6 >> 10/15  Antimicrobials: Vancomycin 10/6 >>10/8 Zosyn 10/6 >> 10/7; 10/16 >> CTX 10/8>>> 10/14  Subjective: Patient was seen and examined at bedside. Patient opens his eyes and following simple commands.He is nonverbal. Review of systems Limited.  Objective: Vitals:   02/22/17 0750 02/22/17 0833 02/22/17 0838 02/22/17 1014  BP: (!) 149/72 (!) 142/76  134/89  Pulse: 83 (!) 111  (!) 126  Resp: (!) 31 (!) 23  Temp: 99.9 F (37.7 C)     TempSrc: Axillary     SpO2: 95% 97%  94%   Weight:      Height:        Intake/Output Summary (Last 24 hours) at 02/22/17 1053 Last data filed at 02/22/17 0500  Gross per 24 hour  Intake          2328.08 ml  Output             1201 ml  Net          1127.08 ml   Filed Weights   02/20/17 0500 02/21/17 0500 02/22/17 0500  Weight: 91.5 kg (201 lb 11.5 oz) 89.7 kg (197 lb 12 oz) 87.6 kg (193 lb 2 oz)    Examination:  General exam: nonverbal, opens eyes only  Neck; trach site no leakage Respiratory system: bibasal decreased breath sound, no wheezing Cardiovascular system: S1 & S2 heard, regular tachycardic.  No pedal edema. Gastrointestinal system: Abdomen is soft and nontender. Normal bowel sounds heard.feeding tube site clean Central nervous system: Alert awake Skin: No rashes, lesions or ulcers Psychiatry: unable to assess     Data Reviewed: I have personally reviewed following labs and imaging studies  CBC:  Recent Labs Lab 02/18/17 0404 02/19/17 0341 02/20/17 0248 02/21/17 0503 02/22/17 0318  WBC 11.1* 10.4 11.6* 14.3* 15.1*  NEUTROABS 9.4* 8.7* 9.4* 12.0* 12.4*  HGB 9.4* 9.9* 10.0* 10.4* 10.8*  HCT 31.4* 31.9* 32.6* 33.9* 34.8*  MCV 88.5 87.4 87.4 87.4 87.9  PLT 440* 417* 548* 553* 017*   Basic Metabolic Panel:  Recent Labs Lab 02/18/17 0404 02/19/17 0341 02/20/17 0248 02/21/17 0503 02/22/17 0318  NA 139 139 136 139 141  K 3.3* 3.6 3.5 3.5 4.2  CL 104 105 105 102 103  CO2 _0 GLUCOSE 150* 125* 140* 144* 123*  BUN 22* 20 23* 25* 31*  CREATININE 0.82 0.75 0.84 0.86 0.96  CALCIUM 8.8* 9.0 9.1 9.6 9.9  MG 2.0 2.0 2.0 2.1 2.2  PHOS 3.3 3.1 3.1 3.9 4.0   GFR: Estimated Creatinine Clearance: 88.9 mL/min (by C-G formula based on SCr of 0.96 mg/dL). Liver Function Tests:  Recent Labs Lab 02/21/17 0503  AST 36  ALT 52  ALKPHOS 255*  BILITOT 0.8  PROT 6.6  ALBUMIN 2.6*   No results for input(s): LIPASE, AMYLASE in the last 168 hours. No results for input(s): AMMONIA in  the last 168 hours. Coagulation Profile: No results for input(s): INR, PROTIME in the last 168 hours. Cardiac Enzymes: No results for input(s): CKTOTAL, CKMB, CKMBINDEX, TROPONINI in the last 168 hours. BNP (last 3 results) No results for input(s): PROBNP in the last 8760 hours. HbA1C: No results for input(s): HGBA1C in the last 72 hours. CBG:  Recent Labs Lab 02/21/17 1634 02/21/17 1938 02/21/17 2316 02/22/17 0318 02/22/17 0744  GLUCAP 154* 145* 187* 116* 154*   Lipid Profile: No results for input(s): CHOL, HDL, LDLCALC, TRIG, CHOLHDL, LDLDIRECT in the last 72 hours. Thyroid Function Tests: No results for input(s): TSH, T4TOTAL, FREET4, T3FREE, THYROIDAB in the last 72 hours. Anemia Panel: No results for input(s): VITAMINB12, FOLATE, FERRITIN, TIBC, IRON, RETICCTPCT in the last 72 hours. Sepsis Labs:  Recent Labs Lab 02/16/17 0319  PROCALCITON <0.10    Recent Results (from the past 240 hour(s))  Culture, respiratory (NON-Expectorated)     Status: None   Collection Time: 02/13/17  2:35 PM  Result Value Ref Range Status  Specimen Description TRACHEAL ASPIRATE  Final   Special Requests Normal  Final   Gram Stain   Final    MODERATE WBC PRESENT,BOTH PMN AND MONONUCLEAR FEW GRAM POSITIVE COCCI IN PAIRS RARE GRAM NEGATIVE RODS    Culture   Final    FEW STAPHYLOCOCCUS LUGDUNENSIS RARE PSEUDOMONAS AERUGINOSA    Report Status 02/16/2017 FINAL  Final   Organism ID, Bacteria PSEUDOMONAS AERUGINOSA  Final   Organism ID, Bacteria STAPHYLOCOCCUS LUGDUNENSIS  Final      Susceptibility   Staphylococcus lugdunensis - MIC*    CIPROFLOXACIN <=0.5 SENSITIVE Sensitive     ERYTHROMYCIN <=0.25 SENSITIVE Sensitive     GENTAMICIN <=0.5 SENSITIVE Sensitive     OXACILLIN 2 SENSITIVE Sensitive     TETRACYCLINE <=1 SENSITIVE Sensitive     VANCOMYCIN <=0.5 SENSITIVE Sensitive     TRIMETH/SULFA <=10 SENSITIVE Sensitive     CLINDAMYCIN <=0.25 SENSITIVE Sensitive     RIFAMPIN <=0.5  SENSITIVE Sensitive     Inducible Clindamycin NEGATIVE Sensitive     * FEW STAPHYLOCOCCUS LUGDUNENSIS   Pseudomonas aeruginosa - MIC*    CEFTAZIDIME 4 SENSITIVE Sensitive     CIPROFLOXACIN <=0.25 SENSITIVE Sensitive     GENTAMICIN <=1 SENSITIVE Sensitive     IMIPENEM 1 SENSITIVE Sensitive     PIP/TAZO 8 SENSITIVE Sensitive     CEFEPIME 2 SENSITIVE Sensitive     * RARE PSEUDOMONAS AERUGINOSA  Culture, blood (routine x 2)     Status: None   Collection Time: 02/14/17 11:00 AM  Result Value Ref Range Status   Specimen Description BLOOD RIGHT HAND  Final   Special Requests IN PEDIATRIC BOTTLE Blood Culture adequate volume  Final   Culture NO GROWTH 5 DAYS  Final   Report Status 02/19/2017 FINAL  Final  Culture, blood (routine x 2)     Status: None   Collection Time: 02/14/17 11:05 AM  Result Value Ref Range Status   Specimen Description BLOOD LEFT HAND  Final   Special Requests IN PEDIATRIC BOTTLE Blood Culture adequate volume  Final   Culture NO GROWTH 5 DAYS  Final   Report Status 02/19/2017 FINAL  Final         Radiology Studies: Dg Chest Port 1 View  Result Date: 02/22/2017 CLINICAL DATA:  Tracheostomy patient.  Fever. EXAM: PORTABLE CHEST 1 VIEW COMPARISON:  Radiographs 02/20/2017. FINDINGS: 0902 hour. Tracheostomy appears unchanged. There is stable cardiomegaly. The right lower lobe remains re-expanded. There is mild bibasilar atelectasis. No pneumothorax, confluent airspace opacity or significant pleural effusions. Old rib fractures are present bilaterally with postsurgical changes on the left. There is an old fracture of the left clavicle. IMPRESSION: Stable mild residual bibasilar atelectasis and cardiomegaly. No acute findings. Electronically Signed   By: Richardean Sale M.D.   On: 02/22/2017 09:19   Dg Chest Port 1 View  Result Date: 02/20/2017 CLINICAL DATA:  Atelectasis EXAM: PORTABLE CHEST 1 VIEW COMPARISON:  Plain film from earlier same day. FINDINGS: Significantly  improved aeration at the right lung base. Probable mild atelectasis at the left lung base. Heart size and mediastinal contours appear stable. Tracheostomy tube remains appropriately positioned at the midline. No pneumothorax seen. IMPRESSION: Significantly improved aeration at the right lung base, presumably resolving atelectasis. Electronically Signed   By: Franki Cabot M.D.   On: 02/20/2017 19:45        Scheduled Meds: . acetylcysteine  2 mL Nebulization Q8H  . chlorhexidine gluconate (MEDLINE KIT)  15 mL Mouth Rinse BID  .  diltiazem  60 mg Oral Q6H  . docusate  100 mg Per Tube Daily  . feeding supplement (PRO-STAT SUGAR FREE 64)  30 mL Per Tube BID  . folic acid  1 mg Per Tube Daily  . free water  200 mL Per Tube Q4H  . furosemide  40 mg Intravenous Q12H  . guaiFENesin  10 mL Per Tube Q4H  . heparin subcutaneous  5,000 Units Subcutaneous Q8H  . insulin aspart  0-15 Units Subcutaneous Q4H  . insulin glargine  25 Units Subcutaneous QHS  . ipratropium  0.5 mg Nebulization QID  . lisinopril  20 mg Per Tube Daily  . mouth rinse  15 mL Mouth Rinse Q4H  . metoprolol tartrate  50 mg Per Tube BID  . multivitamin  15 mL Per Tube Daily  . pantoprazole sodium  40 mg Per Tube Q24H  . potassium chloride  40 mEq Per Tube BID  . thiamine  100 mg Per Tube Daily   Continuous Infusions: . feeding supplement (GLUCERNA 1.2 CAL) 1,000 mL (02/22/17 0427)     LOS: 18 days    Lexany Belknap Tanna Furry, MD [facility] Pager [phone]  If 7PM-7AM, please contact night-coverage [email or web address] Password [email or web address] 02/22/2017, 10:53 AM

## 2017-02-22 NOTE — Care Management Note (Signed)
Case Management Note  Patient Details  Name: Delora FuelCharles Klasen MRN: [id number] Date of Birth: 1956-01-27  Subjective/Objective:    [facility] Pt's contact Dtr, Margreta JourneyAngela Gwynne # [phone] or Brother, Corey SkainsJoe Dickman.  Presents with ICH and IVH with 1.7 cm imdline shift with hydrocephalus, acute resp failure ,afib, trach collar 28%. Has peg 10/19, fevers, he is nonverbal.  Palliative consulted.   Action/Plan: NCM will follow for dc needs.   Expected Discharge Date:                  Expected Discharge Plan:  Skilled Nursing Facility  In-House Referral:  Clinical Social Work  Discharge planning Services  CM Consult  Post Acute Care Choice:  NA Choice offered to:  NA  DME Arranged:  N/A DME Agency:  NA  HH Arranged:  NA HH Agency:     Status of Service:  In process, will continue to follow  If discussed at Long Length of Stay Meetings, dates discussed:    Additional Comments:  Leone Havenaylor, Toi Stelly Clinton, RN 02/22/2017, 4:45 PM

## 2017-02-23 ENCOUNTER — Inpatient Hospital Stay (HOSPITAL_COMMUNITY): Payer: Medicare Other

## 2017-02-23 LAB — CBC WITH DIFFERENTIAL/PLATELET
BASOS PCT: 0 %
Basophils Absolute: 0 10*3/uL (ref 0.0–0.1)
EOS ABS: 0 10*3/uL (ref 0.0–0.7)
Eosinophils Relative: 0 %
HEMATOCRIT: 37.6 % — AB (ref 39.0–52.0)
HEMOGLOBIN: 11.5 g/dL — AB (ref 13.0–17.0)
LYMPHS ABS: 1.8 10*3/uL (ref 0.7–4.0)
Lymphocytes Relative: 12 %
MCH: 27.4 pg (ref 26.0–34.0)
MCHC: 30.6 g/dL (ref 30.0–36.0)
MCV: 89.7 fL (ref 78.0–100.0)
MONO ABS: 0.8 10*3/uL (ref 0.1–1.0)
MONOS PCT: 6 %
Neutro Abs: 11.5 10*3/uL — ABNORMAL HIGH (ref 1.7–7.7)
Neutrophils Relative %: 82 %
Platelets: 527 10*3/uL — ABNORMAL HIGH (ref 150–400)
RBC: 4.19 MIL/uL — ABNORMAL LOW (ref 4.22–5.81)
RDW: 17.4 % — AB (ref 11.5–15.5)
WBC: 14.2 10*3/uL — ABNORMAL HIGH (ref 4.0–10.5)

## 2017-02-23 LAB — GLUCOSE, CAPILLARY
GLUCOSE-CAPILLARY: 137 mg/dL — AB (ref 65–99)
GLUCOSE-CAPILLARY: 177 mg/dL — AB (ref 65–99)
GLUCOSE-CAPILLARY: 184 mg/dL — AB (ref 65–99)
GLUCOSE-CAPILLARY: 213 mg/dL — AB (ref 65–99)
Glucose-Capillary: 228 mg/dL — ABNORMAL HIGH (ref 65–99)
Glucose-Capillary: 168 mg/dL — ABNORMAL HIGH (ref 65–99)

## 2017-02-23 MED ORDER — IPRATROPIUM-ALBUTEROL 0.5-2.5 (3) MG/3ML IN SOLN
3.0000 mL | Freq: Four times a day (QID) | RESPIRATORY_TRACT | Status: DC
  Administered 2017-02-23 – 2017-02-26 (×13): 3 mL via RESPIRATORY_TRACT
  Filled 2017-02-23 (×12): qty 3

## 2017-02-23 NOTE — Progress Notes (Signed)
This note also relates to the following rows which could not be included: SpO2 - Cannot attach notes to unvalidated device data  RT attempted to place patient back on trach collar. Patient tolerated for about an hour. RT had to place patient back on vent due to increased RR, WOB, and desating to 86-88%.

## 2017-02-23 NOTE — Progress Notes (Addendum)
PROGRESS NOTE    Stuart Mendoza  GNF:[id number] DOB: [birth year] DOA: 02/04/2017 PCP: Patient, No Pcp Per   Brief Narrative: 61 year old male with past medical history of A. fib on Xarelto, COPD, hypertension, diabetes, alcohol abuse who was admitted on 10/6 with large right parietal ICH and IVH, and a 1.7 cm midline shift with hydrocephalus.  Also was found to be in acute hypoxic respiratory failure due to hospital-acquired pneumonia and subsequently intubated. Patient was unable to be weaned off vent, trach was placed. PEG also performed. Remained stable and has been transferred to [facility] for further care.   SIGNIFICANT EVENTS: 10/6 Admit, neurology, neurosurgery consulted, kcentra, 3% NS  10/12 remains with reasonable neurostatus; s/p Tracheostomy 10/13: Tolerated ATC All day 10/14: Went back on vent 10/14 PM due to dyspnea.  10/15: minimally responsive which sounds worse than prior baseline last week based on EMR notes. on TC trial has RR 35-40 and thick purulent secretions from Trach. 10/16: fever, pan-cultured, cuff leak so trach exchanged for #6 distal XLT; restart zosyn 10/17: increased tachycardia, thick tan secretions, tmax 98.8, failed SBT secondary to rr in 40's, per RT- multiple mucous plugs requiring bag/lavage 10/18: HR better controlled, tmax 102.1 but improving, Na improving with free water, following commands on right. Still having mucous plugging 10/19: PEG placed by Trauma surgery; tolerated 2hrs of PSV 10/20: RT says no further mucous plugs but does have an intermittent cuff leak that is positional (still getting good tidal volumes in 500's despite leak)  Assessment & Plan:  # ICH/IVH: -Seen by neurologist, recommended MRI in 6 weeks and outpatient follow-up. -PT OT therapy. Patient likely needs LTAC or SNF on discharge -he opens eyes nonverbal.  # Acute respiratory failure with hypoxia/HCAP, mucous plugging and acute pulmonary edema -Status post trach  10/12 -Sputum cultures grew Pseudomonas and staph lugdenesis, completed abx course.Zosyn for 7 days. -Patient had respiratory distress last night requiring ventilator. Currently on vent. Pulmonary critical care is following. Try to wean to trach as tolerated. -Currently on daily IV Lasix. 19 negative balance.DC free water through the tube. -Atelectasis improving, continue chest PT and pulmonary toilet.   # PAF -Off anticoagulation now given intracranial hemorrhage. Neurology recommending hold a/c until MRI is repeated in 6 weeks. Patient has been on Diltiazem through tube feed q. 6-hour. Monitor HR  #Hypertension: continue metoprolol, lisinopril, diltiazem and Lasix. Monitor blood pressure.  #Diabetes type 2: continue current insulin regimen.Monitor blood sugar level.  # Hypernatrema/Hypokalemia -resolved Monitor BMP   # Anemia of chronic disease and critical illness Hemoglobin remains stable.  # mild fever: Chest x-ray with no acute finding. Monitor fever curve. Patient completed antibiotics course. Tylenol as needed.  # goals of care: Patient is not able to participate in discussion. His in nonverbal. He has prolonged hospitalization with multiple comorbidities. I consulted palliative care for goals of care discussion.  Try to wean to trach. If patient is vent dependent he may need to go to LTAC. Case Freight forwarder and Education officer, museum following.  DVT prophylaxis: SCD Code Status:full code Family Communication:no family at bedside Disposition Plan:urrently admitted    Consultants:   PCCM  Neurology   Palliative care  Procedures: ETT 10/06 >> 10/12 Trach #6 Shiley (DF) 10/12 >> 10/16 Trach #6 Distal XLT 10/16 >> LSC TLC 10/6 >> 10/15  Antimicrobials: Vancomycin 10/6 >>10/8 Zosyn 10/6 >> 10/7; 10/16 >> CTX 10/8>>> 10/14  Subjective: Patient was seen and examined at bedside. Overnight even noted. Back on ventilator. Unable to obtain review  of system. Objective: Vitals:    02/23/17 0700 02/23/17 0749 02/23/17 0831 02/23/17 0840  BP: 137/67 136/80 101/66   Pulse: 85 85 (!) 108   Resp: 19 19 (!) 27   Temp:  99.5 F (37.5 C)    TempSrc:  Oral    SpO2: 99% 98% 100% 100%  Weight:      Height:        Intake/Output Summary (Last 24 hours) at 02/23/17 1115 Last data filed at 02/23/17 0730  Gross per 24 hour  Intake           1332.5 ml  Output              826 ml  Net            506.5 ml   Filed Weights   02/21/17 0500 02/22/17 0500 02/23/17 0455  Weight: 89.7 kg (197 lb 12 oz) 87.6 kg (193 lb 2 oz) 88.6 kg (195 lb 5.2 oz)    Examination:  General exam: Nonverbal, unchanged from yesterday Neck; on ventilator now, trach site clean Respiratory system: Bibasal coarse breath sound, no wheezing Cardiovascular system: Regular rate rhythm, S1-S2 normal.  No pedal edema. Gastrointestinal system: Abdomen is soft and nontender. Normal bowel sounds heard.feeding tube site clean Central nervous system: Alert awake Skin: No rashes, lesions or ulcers Psychiatry: unable to assess     Data Reviewed: I have personally reviewed following labs and imaging studies  CBC:  Recent Labs Lab 02/19/17 0341 02/20/17 0248 02/21/17 0503 02/22/17 0318 02/23/17 0654  WBC 10.4 11.6* 14.3* 15.1* 14.2*  NEUTROABS 8.7* 9.4* 12.0* 12.4* 11.5*  HGB 9.9* 10.0* 10.4* 10.8* 11.5*  HCT 31.9* 32.6* 33.9* 34.8* 37.6*  MCV 87.4 87.4 87.4 87.9 89.7  PLT 417* 548* 553* 592* 947*   Basic Metabolic Panel:  Recent Labs Lab 02/18/17 0404 02/19/17 0341 02/20/17 0248 02/21/17 0503 02/22/17 0318  NA 139 139 136 139 141  K 3.3* 3.6 3.5 3.5 4.2  CL 104 105 105 102 103  CO2 _0 GLUCOSE 150* 125* 140* 144* 123*  BUN 22* 20 23* 25* 31*  CREATININE 0.82 0.75 0.84 0.86 0.96  CALCIUM 8.8* 9.0 9.1 9.6 9.9  MG 2.0 2.0 2.0 2.1 2.2  PHOS 3.3 3.1 3.1 3.9 4.0   GFR: Estimated Creatinine Clearance: 89.4 mL/min (by C-G formula based on SCr of 0.96 mg/dL). Liver Function  Tests:  Recent Labs Lab 02/21/17 0503  AST 36  ALT 52  ALKPHOS 255*  BILITOT 0.8  PROT 6.6  ALBUMIN 2.6*   No results for input(s): LIPASE, AMYLASE in the last 168 hours. No results for input(s): AMMONIA in the last 168 hours. Coagulation Profile: No results for input(s): INR, PROTIME in the last 168 hours. Cardiac Enzymes: No results for input(s): CKTOTAL, CKMB, CKMBINDEX, TROPONINI in the last 168 hours. BNP (last 3 results) No results for input(s): PROBNP in the last 8760 hours. HbA1C: No results for input(s): HGBA1C in the last 72 hours. CBG:  Recent Labs Lab 02/22/17 1602 02/22/17 1943 02/22/17 2341 02/23/17 0317 02/23/17 0809  GLUCAP 129* 177* 224* 137* 177*   Lipid Profile: No results for input(s): CHOL, HDL, LDLCALC, TRIG, CHOLHDL, LDLDIRECT in the last 72 hours. Thyroid Function Tests: No results for input(s): TSH, T4TOTAL, FREET4, T3FREE, THYROIDAB in the last 72 hours. Anemia Panel: No results for input(s): VITAMINB12, FOLATE, FERRITIN, TIBC, IRON, RETICCTPCT in the last 72 hours. Sepsis Labs: No results for input(s): PROCALCITON, LATICACIDVEN  in the last 168 hours.  Recent Results (from the past 240 hour(s))  Culture, respiratory (NON-Expectorated)     Status: None   Collection Time: 02/13/17  2:35 PM  Result Value Ref Range Status   Specimen Description TRACHEAL ASPIRATE  Final   Special Requests Normal  Final   Gram Stain   Final    MODERATE WBC PRESENT,BOTH PMN AND MONONUCLEAR FEW GRAM POSITIVE COCCI IN PAIRS RARE GRAM NEGATIVE RODS    Culture   Final    FEW STAPHYLOCOCCUS LUGDUNENSIS RARE PSEUDOMONAS AERUGINOSA    Report Status 02/16/2017 FINAL  Final   Organism ID, Bacteria PSEUDOMONAS AERUGINOSA  Final   Organism ID, Bacteria STAPHYLOCOCCUS LUGDUNENSIS  Final      Susceptibility   Staphylococcus lugdunensis - MIC*    CIPROFLOXACIN <=0.5 SENSITIVE Sensitive     ERYTHROMYCIN <=0.25 SENSITIVE Sensitive     GENTAMICIN <=0.5 SENSITIVE  Sensitive     OXACILLIN 2 SENSITIVE Sensitive     TETRACYCLINE <=1 SENSITIVE Sensitive     VANCOMYCIN <=0.5 SENSITIVE Sensitive     TRIMETH/SULFA <=10 SENSITIVE Sensitive     CLINDAMYCIN <=0.25 SENSITIVE Sensitive     RIFAMPIN <=0.5 SENSITIVE Sensitive     Inducible Clindamycin NEGATIVE Sensitive     * FEW STAPHYLOCOCCUS LUGDUNENSIS   Pseudomonas aeruginosa - MIC*    CEFTAZIDIME 4 SENSITIVE Sensitive     CIPROFLOXACIN <=0.25 SENSITIVE Sensitive     GENTAMICIN <=1 SENSITIVE Sensitive     IMIPENEM 1 SENSITIVE Sensitive     PIP/TAZO 8 SENSITIVE Sensitive     CEFEPIME 2 SENSITIVE Sensitive     * RARE PSEUDOMONAS AERUGINOSA  Culture, blood (routine x 2)     Status: None   Collection Time: 02/14/17 11:00 AM  Result Value Ref Range Status   Specimen Description BLOOD RIGHT HAND  Final   Special Requests IN PEDIATRIC BOTTLE Blood Culture adequate volume  Final   Culture NO GROWTH 5 DAYS  Final   Report Status 02/19/2017 FINAL  Final  Culture, blood (routine x 2)     Status: None   Collection Time: 02/14/17 11:05 AM  Result Value Ref Range Status   Specimen Description BLOOD LEFT HAND  Final   Special Requests IN PEDIATRIC BOTTLE Blood Culture adequate volume  Final   Culture NO GROWTH 5 DAYS  Final   Report Status 02/19/2017 FINAL  Final         Radiology Studies: Dg Chest Port 1 View  Result Date: 02/23/2017 CLINICAL DATA:  Shortness of breath. History of COPD, hypertension and stroke. EXAM: PORTABLE CHEST 1 VIEW COMPARISON:  Chest radiograph February 22, 2017 FINDINGS: Stable cardiomegaly. Similar diffuse interstitial prominence without pleural effusion or focal consolidation. Stable tracheostomy tube. No pneumothorax. Healing RIGHT humeral head and neck fracture. LEFT rib ORIF. IMPRESSION: Stable cardiomegaly and interstitial prominence. Electronically Signed   By: Elon Alas M.D.   On: 02/23/2017 00:40   Dg Chest Port 1 View  Result Date: 02/22/2017 CLINICAL DATA:   Tracheostomy patient.  Fever. EXAM: PORTABLE CHEST 1 VIEW COMPARISON:  Radiographs 02/20/2017. FINDINGS: 0902 hour. Tracheostomy appears unchanged. There is stable cardiomegaly. The right lower lobe remains re-expanded. There is mild bibasilar atelectasis. No pneumothorax, confluent airspace opacity or significant pleural effusions. Old rib fractures are present bilaterally with postsurgical changes on the left. There is an old fracture of the left clavicle. IMPRESSION: Stable mild residual bibasilar atelectasis and cardiomegaly. No acute findings. Electronically Signed   By: Caryl Comes.D.  On: 02/22/2017 09:19        Scheduled Meds: . acetylcysteine  2 mL Nebulization Q8H  . chlorhexidine gluconate (MEDLINE KIT)  15 mL Mouth Rinse BID  . diltiazem  60 mg Oral Q6H  . docusate  100 mg Per Tube Daily  . feeding supplement (PRO-STAT SUGAR FREE 64)  30 mL Per Tube BID  . folic acid  1 mg Per Tube Daily  . furosemide  40 mg Intravenous Daily  . guaiFENesin  10 mL Per Tube Q4H  . heparin subcutaneous  5,000 Units Subcutaneous Q8H  . insulin aspart  0-15 Units Subcutaneous Q4H  . insulin glargine  25 Units Subcutaneous QHS  . ipratropium-albuterol  3 mL Nebulization Q6H  . lisinopril  20 mg Per Tube Daily  . mouth rinse  15 mL Mouth Rinse Q4H  . metoprolol tartrate  50 mg Per Tube BID  . multivitamin  15 mL Per Tube Daily  . pantoprazole sodium  40 mg Per Tube Q24H  . potassium chloride  40 mEq Per Tube BID  . thiamine  100 mg Per Tube Daily   Continuous Infusions: . feeding supplement (GLUCERNA 1.2 CAL) 1,000 mL (02/23/17 0730)     LOS: 19 days    Kenan Moodie Tanna Furry, MD [facility] Pager [phone]  If 7PM-7AM, please contact night-coverage [email or web address] Password [email or web address] 02/23/2017, 11:15 AM

## 2017-02-23 NOTE — Plan of Care (Signed)
Problem: Physical Regulation: Goal: Ability to maintain clinical measurements within normal limits will improve Outcome: Progressing Patient unable to respond at this time so, no teach back displayed at this time. Given bedtime medications earlier due to tachycardia.

## 2017-02-23 NOTE — Progress Notes (Signed)
CSW continuing to follow- if able to wean to trach collar [facility]- if unable to wean will have to pursue vent SNF  Burna SisJenna H. Roe Wilner, LCSW Clinical Social Worker [phone]

## 2017-02-23 NOTE — Progress Notes (Signed)
No charge note:   Palliative consult received.   Left message for patient's brother attempting to arrange GOC meeting.  Ocie BobKasie Sayer Masini, AGNP-C Palliative Medicine  Please call Palliative Medicine team phone with any questions [phone]. For individual providers please see AMION.

## 2017-02-23 NOTE — Progress Notes (Signed)
No charge note:   Palliative consult received.   Left message for patient's daughter Stuart Mendoza requesting return call to arrange GOC meeting.  Stuart Mendoza, AGNP-C Palliative Medicine  Please call Palliative Medicine team phone with any questions [phone]. For individual providers please see AMION.

## 2017-02-23 NOTE — Progress Notes (Signed)
Patient continues with increased work of breathing. Chest xray results back and hospitalist received results. Called coverage d/t pts increased respirations 34-36/min and labored. Verbal order to place patient on vent at this time. Respiratory called for vent placement.

## 2017-02-23 NOTE — Progress Notes (Signed)
hospitalist paged d/t patients labored breathing and increased respiratory workload. Chest xray ordered. Will continue to monitor patient.

## 2017-02-23 NOTE — Plan of Care (Signed)
Problem: Health Behavior/Discharge Planning: Goal: Ability to manage health-related needs will improve Outcome: Not Progressing Patient dependent for care  Problem: Self-Care: Goal: Ability to communicate needs accurately will improve Outcome: Not Progressing Patient unable to communicate  Problem: Nutrition: Goal: Dietary intake will improve Outcome: Progressing Tube feeding

## 2017-02-23 NOTE — Progress Notes (Signed)
PULMONARY / CRITICAL CARE MEDICINE   Name: Stuart Mendoza MRN: [id number] DOB: 1955/09/02    ADMISSION DATE:  02/04/2017  REFERRING MD:  Dr. Sheria Langameron, ER  CHIEF COMPLAINT:  Altered mental status  HISTORY OF PRESENT ILLNESS:   61yo M with Afib (on xarelto), COPD, HTN, DM, and Etoh abuse, presented to ER on 10/6 with AMS and found to have large right parietal ICH with IVH and 1.7 cm midline shift with hydrocephalus. Intubated for airway, and given Kcentra.  Found to have RUL HCAP pneumonia. Had recent admission (9/29 to 10/04) for alcohol withdrawal and right humerus fracture (12/2016).   SUBJECTIVE:  Trach collar x 24 hours Still with thick secretions despite mucomyst and chest PT.   VITAL SIGNS: BP 101/66   Pulse (!) 108   Temp 99.5 F (37.5 C) (Oral)   Resp (!) 27   Ht 5\' 10"  (1.778 m)   Wt 195 lb 5.2 oz (88.6 kg)   SpO2 100%   BMI 28.03 kg/m   VENTILATOR SETTINGS: Vent Mode: PSV;CPAP FiO2 (%):  [28 %-40 %] 40 % Set Rate:  [14 bmp] 14 bmp Vt Set:  [590 mL-620 mL] 590 mL PEEP:  [5 cmH20] 5 cmH20 Pressure Support:  [12 cmH20] 12 cmH20 Plateau Pressure:  [15 cmH20] 15 cmH20  INTAKE / OUTPUT: I/O last 3 completed shifts: In: 715 [NG/GT:715] Out: 1627 [Urine:1626; Stool:1]  PHYSICAL EXAMINATION:  General:  Ill appearing male, poorly reponsive HEENT: Trach CDI PSY:na Neuro: poorly responsive CV: hsr rrr  PULM: coarse rhonchi bilat, thick yellow secretions via trach WG:NFAOGI:soft, non-tender, bsx4 active TF Extremities: warm/dry, +edema  Skin: no rashes or lesions   LABS:  BMET  Recent Labs Lab 02/20/17 0248 02/21/17 0503 02/22/17 0318  NA 136 139 141  K 3.5 3.5 4.2  CL 105 102 103  CO2 25 27 26   BUN 23* 25* 31*  CREATININE 0.84 0.86 0.96  GLUCOSE 140* 144* 123*   Electrolytes  Recent Labs Lab 02/20/17 0248 02/21/17 0503 02/22/17 0318  CALCIUM 9.1 9.6 9.9  MG 2.0 2.1 2.2  PHOS 3.1 3.9 4.0    CBC  Recent Labs Lab 02/21/17 0503 02/22/17 0318  02/23/17 0654  WBC 14.3* 15.1* 14.2*  HGB 10.4* 10.8* 11.5*  HCT 33.9* 34.8* 37.6*  PLT 553* 592* 527*    Coag's No results for input(s): APTT, INR in the last 168 hours.  Sepsis Markers No results for input(s): LATICACIDVEN, PROCALCITON, O2SATVEN in the last 168 hours.  ABG  Recent Labs Lab 02/16/17 1640  PHART 7.470*  PCO2ART 34.7  PO2ART 79.0*    Liver Enzymes  Recent Labs Lab 02/21/17 0503  AST 36  ALT 52  ALKPHOS 255*  BILITOT 0.8  ALBUMIN 2.6*    Cardiac Enzymes No results for input(s): TROPONINI, PROBNP in the last 168 hours.  Glucose  Recent Labs Lab 02/22/17 1235 02/22/17 1602 02/22/17 1943 02/22/17 2341 02/23/17 0317 02/23/17 0809  GLUCAP 202* 129* 177* 224* 137* 177*    Imaging Dg Chest Port 1 View  Result Date: 02/23/2017 CLINICAL DATA:  Shortness of breath. History of COPD, hypertension and stroke. EXAM: PORTABLE CHEST 1 VIEW COMPARISON:  Chest radiograph February 22, 2017 FINDINGS: Stable cardiomegaly. Similar diffuse interstitial prominence without pleural effusion or focal consolidation. Stable tracheostomy tube. No pneumothorax. Healing RIGHT humeral head and neck fracture. LEFT rib ORIF. IMPRESSION: Stable cardiomegaly and interstitial prominence. Electronically Signed   By: Awilda Metroourtnay  Bloomer M.D.   On: 02/23/2017 00:40   STUDIES:  CT head 10/6 > Large ICH Rt parietal lobe.  1.7 cm of midline shift.  Small amount of hemorrhage in the Lt frontal lobe and a small amount of subdural blood. CT head 10/9 > unchanged CT abd/pelvis 10/17 > No anatomic contraindication to gastrostomy. However, there is a possible communication between the greater curvature of the stomach and a loop of adjacent jejunum. This may be a fake out by CT.Mild edema in the mesentery without overt ascites or evidence of focal abscess.  CULTURES: Per primary>  ANTIBIOTICS: Per primary  SIGNIFICANT EVENTS: 10/6 Admit, neurology, neurosurgery consulted, kcentra, 3%  NS 10/12 remains with reasonable neurostatus; s/p Tracheostomy 10/13: Tolerated ATC All day 10/14: Went back on vent 10/14 PM due to dyspnea.   10/15: minimally responsive which sounds worse than prior baseline last week based on EMR notes. on TC trial has RR 35-40 and thick purulent secretions from Trach. 10/16: fever, pan-cultured, cuff leak so trach exchanged for #6 distal XLT; restart zosyn 10/17: increased tachycardia, thick tan secretions, tmax 98.8, failed SBT secondary to rr in 40's, per RT- multiple mucous plugs requiring bag/lavage 10/18:  HR better controlled, tmax 102.1 but improving, Na improving with free water, following commands on right.  Still having mucous plugging 10/19: PEG placed by Trauma surgery; tolerated 2hrs of PSV 10/20: RT says no further mucous plugs but does have an intermittent cuff leak that is positional (still getting good tidal volumes in 500's despite leak) 10/22 on TC for 24 hours at this point. Tolerating well.  10/25 back on vent for mucus plugging.  LINES/TUBES: ETT 10/06 >> 10/12 Trach #6 Shiley (DF) 10/12 >> 10/16 Trach #6 Distal XLT 10/16 >> L [place] TLC 10/6 >> 10/15   ASSESSMENT / PLAN: 61yo M with Afib (on xarelto), COPD, HTN, DM, and Etoh abuse, admit 10/6 with large right parietal ICH with IVH and 1.7 cm midline shift with hydrocephalus. Acute Hypoxic Respiratory failure with HCAP pneumonia. Had recent admission (9/29 to 10/04) for alcohol withdrawal and right humerus fracture (12/2016). Course now complicated by VAP and copious secretions. 10/24 back on vent for mucus plugging.    PULM Acute Hypoxic respiratory failure; VAP pneumonia; Mucous plugging; Pulmonary edema; Hx COPD: - S/p perc trach 10/12; was doing TC trials until developed tachypnea and fever on 10/15.  - Sputum culture (10/15): pseudomonas and staph lugdenesis -10/24 mucus plugging on t collar and require full vent support. 10/25 weaning to t collar  P: - Abx per primary  care - TC as tolerated - Continue mucumyst nebs q8hr and atrovent nebs q4hrs to be timed with chest PT. Stop xopenex nebs. - Not a candidate for trach downsize.  - CXR continues to show bibasilar infiltrates and mild pulm edema that is unchanged - Lasix 20mg  IV BID for diuresis    RLL collapse (new on CXR 10/22) - taken pre- mucomyst and chest PT. O2 sats fine.  - Repeat CXR 10/25 shows increased aeration    PCCM will see 10/29, please call if needed sooner.  Brett Canales Minor ACNP [facility] PCCM Pager [phone] till 3 pm If no answer page [phone] 02/23/2017, 9:39 AM

## 2017-02-24 ENCOUNTER — Inpatient Hospital Stay (HOSPITAL_COMMUNITY): Payer: Medicare Other

## 2017-02-24 LAB — CBC WITH DIFFERENTIAL/PLATELET
BASOS ABS: 0 10*3/uL (ref 0.0–0.1)
Basophils Relative: 0 %
EOS ABS: 0 10*3/uL (ref 0.0–0.7)
Eosinophils Relative: 0 %
HCT: 37.5 % — ABNORMAL LOW (ref 39.0–52.0)
Hemoglobin: 11.4 g/dL — ABNORMAL LOW (ref 13.0–17.0)
Lymphocytes Relative: 6 %
Lymphs Abs: 1.5 10*3/uL (ref 0.7–4.0)
MCH: 27.7 pg (ref 26.0–34.0)
MCHC: 30.4 g/dL (ref 30.0–36.0)
MCV: 91.2 fL (ref 78.0–100.0)
MONO ABS: 1.8 10*3/uL — AB (ref 0.1–1.0)
Monocytes Relative: 7 %
NEUTROS PCT: 87 %
Neutro Abs: 21.7 10*3/uL — ABNORMAL HIGH (ref 1.7–7.7)
Platelets: 546 10*3/uL — ABNORMAL HIGH (ref 150–400)
RBC: 4.11 MIL/uL — AB (ref 4.22–5.81)
RDW: 18.2 % — AB (ref 11.5–15.5)
WBC: 25 10*3/uL — AB (ref 4.0–10.5)

## 2017-02-24 LAB — GLUCOSE, CAPILLARY
GLUCOSE-CAPILLARY: 179 mg/dL — AB (ref 65–99)
GLUCOSE-CAPILLARY: 206 mg/dL — AB (ref 65–99)
GLUCOSE-CAPILLARY: 222 mg/dL — AB (ref 65–99)
Glucose-Capillary: 227 mg/dL — ABNORMAL HIGH (ref 65–99)
Glucose-Capillary: 220 mg/dL — ABNORMAL HIGH (ref 65–99)
Glucose-Capillary: 206 mg/dL — ABNORMAL HIGH (ref 65–99)

## 2017-02-24 LAB — BASIC METABOLIC PANEL
ANION GAP: 11 (ref 5–15)
BUN: 50 mg/dL — ABNORMAL HIGH (ref 6–20)
CALCIUM: 10.3 mg/dL (ref 8.9–10.3)
CO2: 26 mmol/L (ref 22–32)
Chloride: 110 mmol/L (ref 101–111)
Creatinine, Ser: 1.38 mg/dL — ABNORMAL HIGH (ref 0.61–1.24)
GFR, EST NON AFRICAN AMERICAN: 53 mL/min — AB (ref >60)
Glucose, Bld: 197 mg/dL — ABNORMAL HIGH (ref 65–99)
Potassium: 4.8 mmol/L (ref 3.5–5.1)
Sodium: 147 mmol/L — ABNORMAL HIGH (ref 135–145)

## 2017-02-24 LAB — MRSA PCR SCREENING: MRSA by PCR: NEGATIVE

## 2017-02-24 LAB — C DIFFICILE QUICK SCREEN W PCR REFLEX
C DIFFICLE (CDIFF) ANTIGEN: NEGATIVE
C Diff interpretation: NOT DETECTED
C Diff toxin: NEGATIVE

## 2017-02-24 LAB — PROCALCITONIN: Procalcitonin: 0.19 ng/mL

## 2017-02-24 LAB — MAGNESIUM: MAGNESIUM: 2.5 mg/dL — AB (ref 1.7–2.4)

## 2017-02-24 LAB — LACTIC ACID, PLASMA
LACTIC ACID, VENOUS: 2.2 mmol/L — AB (ref 0.5–1.9)
Lactic Acid, Venous: 1.9 mmol/L (ref 0.5–1.9)

## 2017-02-24 MED ORDER — PIPERACILLIN-TAZOBACTAM 3.375 G IVPB
3.3750 g | Freq: Three times a day (TID) | INTRAVENOUS | Status: DC
  Administered 2017-02-24 – 2017-02-26 (×6): 3.375 g via INTRAVENOUS
  Filled 2017-02-24 (×7): qty 50

## 2017-02-24 MED ORDER — METRONIDAZOLE IN NACL 5-0.79 MG/ML-% IV SOLN
500.0000 mg | Freq: Three times a day (TID) | INTRAVENOUS | Status: DC
  Administered 2017-02-24: 500 mg via INTRAVENOUS
  Filled 2017-02-24: qty 100

## 2017-02-24 MED ORDER — METOPROLOL TARTRATE 5 MG/5ML IV SOLN
5.0000 mg | Freq: Once | INTRAVENOUS | Status: AC
  Administered 2017-02-24: 5 mg via INTRAVENOUS
  Filled 2017-02-24: qty 5

## 2017-02-24 MED ORDER — SODIUM CHLORIDE 0.9 % IV SOLN
500.0000 mg | Freq: Three times a day (TID) | INTRAVENOUS | Status: DC
  Administered 2017-02-24: 500 mg via INTRAVENOUS
  Filled 2017-02-24 (×2): qty 500

## 2017-02-24 MED ORDER — METOPROLOL TARTRATE 5 MG/5ML IV SOLN
5.0000 mg | INTRAVENOUS | Status: DC | PRN
  Administered 2017-02-24 – 2017-02-25 (×2): 5 mg via INTRAVENOUS
  Filled 2017-02-24 (×2): qty 5

## 2017-02-24 MED ORDER — LEVOFLOXACIN IN D5W 500 MG/100ML IV SOLN
500.0000 mg | Freq: Every day | INTRAVENOUS | Status: DC
  Administered 2017-02-24: 500 mg via INTRAVENOUS
  Filled 2017-02-24 (×2): qty 100

## 2017-02-24 NOTE — Care Management Important Message (Signed)
Important Message  Patient Details  Name: Delora FuelCharles Leeb MRN: [id number] Date of Birth: [birth year]   Medicare Important Message Given:  Yes    Leone Havenaylor, Virgene Tirone Clinton, RN 02/24/2017, 12:03 PMImportant Message  Patient Details  Name: Delora FuelCharles Monteith MRN: [id number] Date of Birth: [birth year]   Medicare Important Message Given:  Yes    Leone Havenaylor, Oak Dorey Clinton, RN 02/24/2017, 12:03 PM

## 2017-02-24 NOTE — Plan of Care (Signed)
Problem: Coping: Goal: Level of anxiety will decrease Outcome: Progressing Discussed with patient plan of care for the and responding with right hand gestures for communication with some teach back displayed.  Will keep encouraging the patient to participate and interact.

## 2017-02-24 NOTE — Progress Notes (Signed)
CRITICAL VALUE ALERT  Critical Value:  Lactic acid 2.2  Date & Time Notied:  02/24/17 @ 0647  Provider Notified: Delma Post. Hall  Orders Received/Actions taken:  Antibiotics previously ordered

## 2017-02-24 NOTE — Progress Notes (Signed)
Didn't get a chance before an appropriate time to call daughter Angela CarriMargreta Journeyere back at [phone].  Will have first shift follow-up

## 2017-02-24 NOTE — Progress Notes (Signed)
RT went into pt's room to collect sputum culture, attempted to suction and wasn't able to get much out. Sputum collection bottle left in place, RN notified and RT will let day shift therapist know.

## 2017-02-24 NOTE — Progress Notes (Signed)
MD returned call. Discussed giving metoprolol slowly due to hemodynamic instability. As long as SBP greater 100 will give metoprolol slow IVP. HR is decreasing and BP is increasing. MD states to hold lasix and Potassium secondary to patient BP and K 4.8. Will continue to monitor and assess patient.

## 2017-02-24 NOTE — Progress Notes (Signed)
Patient had 12 beat run of VTach. He is asymptomatic. MD paged and notified. Event strip printed and put in chart. Will continue to monitor.

## 2017-02-24 NOTE — Progress Notes (Signed)
HR increasing and BP decreasing.  MD paged and notified.  Metoprolol ordered but held until speak with MD due to BP 87/68.  Will discuss with MD.  Continuing to monitor pt.

## 2017-02-24 NOTE — Progress Notes (Addendum)
PROGRESS NOTE    Zyshonne Malecha  QMV:[id number] DOB: 06/27/1955 DOA: 02/04/2017 PCP: Patient, No Pcp Per   Brief Narrative: 61 year old male with past medical history of A. fib on Xarelto, COPD, hypertension, diabetes, alcohol abuse who was admitted on 10/6 with large right parietal ICH and IVH, and a 1.7 cm midline shift with hydrocephalus.  Also was found to be in acute hypoxic respiratory failure due to hospital-acquired pneumonia and subsequently intubated. Patient was unable to be weaned off vent, trach was placed. PEG also performed. Remained stable and has been transferred to [facility] for further care.   SIGNIFICANT EVENTS: 10/6 Admit, neurology, neurosurgery consulted, kcentra, 3% NS  10/12 remains with reasonable neurostatus; s/p Tracheostomy 10/13: Tolerated ATC All day 10/14: Went back on vent 10/14 PM due to dyspnea.  10/15: minimally responsive which sounds worse than prior baseline last week based on EMR notes. on TC trial has RR 35-40 and thick purulent secretions from Trach. 10/16: fever, pan-cultured, cuff leak so trach exchanged for #6 distal XLT; restart zosyn 10/17: increased tachycardia, thick tan secretions, tmax 98.8, failed SBT secondary to rr in 40's, per RT- multiple mucous plugs requiring bag/lavage 10/18: HR better controlled, tmax 102.1 but improving, Na improving with free water, following commands on right. Still having mucous plugging 10/19: PEG placed by Trauma surgery; tolerated 2hrs of PSV 10/20: RT says no further mucous plugs but does have an intermittent cuff leak that is positional (still getting good tidal volumes in 500's despite leak)  Assessment & Plan:  # ICH/IVH: -Seen by neurologist, recommended MRI in 6 weeks and outpatient follow-up. -PT OT therapy. Patient likely needs LTAC or SNF on discharge -he opens eyes nonverbal.  # Acute respiratory failure with hypoxia/HCAP, mucous plugging and acute pulmonary edema -Status post trach  10/12 -Sputum cultures grew Pseudomonas and staph lugdenesis, completed abx course.Zosyn for 7 days. -Patient is now back on the ventilator. He had fever with temperature of 103 last night with respiratory distress. He started on broad-spectrum antibiotics. I will discontinue vancomycin, Levaquin and Flagyl. MRSA PCR negative. I will start IV Zosyn for coverage of possible aspiration pneumonia. -holding IV Lasix.  -continue chest PT and pulmonary toilet.   # PAF, Afib with RVR this morning: Continue oral diltiazem. Started IV metoprolol with improvement in heart rate. Blood pressure improved. -Off anticoagulation now given intracranial hemorrhage. Neurology recommending hold a/c until MRI is repeated in 6 weeks. Patient has been on Diltiazem through tube feed q. 6-hour. Monitor HR  #Hypertension: continue metoprolol,  diltiazem. Monitor blood pressure. Hold lisinopril and lasix  #Diabetes type 2: continue current insulin regimen.Monitor blood sugar level.  # Hypernatrema/Hypokalemia -resolved Monitor BMP   # Anemia of chronic disease and critical illness Hemoglobin remains stable.  # AKI due to Afib w RVR/ACEI/diuretics: hold IV lasix and lisinopril. Monitor BMP.  # diarrhea due to tube feed: c diff negative/.  # goals of care: Patient is not able to participate in discussion. His in nonverbal. He has prolonged hospitalization with multiple comorbidities. Patient with no clinical improvement. I believe patient will benefit from hospice care and DO NOT RESUSCITATE status. Palliative care planning for family meeting tomorrow.  Try to wean to trach. If patient is vent dependent he may need to go to LTAC. Case Freight forwarder and Education officer, museum following.  DVT prophylaxis: SCD Code Status:full code Family Communication:no family at bedside Disposition Plan:Currently admitted    Consultants:   PCCM  Neurology   Palliative care  Procedures: ETT 10/06 >>  10/12 Trach #6 Shiley (DF)  10/12 >> 10/16 Trach #6 Distal XLT 10/16 >> LSC TLC 10/6 >> 10/15  Antimicrobials: Vancomycin 10/6 >>10/8 Zosyn 10/6 >> 10/7; 10/16 >> CTX 10/8>>> 10/14  Subjective: Patient was seen and examined at bedside. Overnight events noted. Review of system is limited.  Objective: Vitals:   02/24/17 0828 02/24/17 1121 02/24/17 1129 02/24/17 1206  BP:  109/75  139/86  Pulse:  (!) 112  (!) 104  Resp:  20  15  Temp:    99.1 F (37.3 C)  TempSrc:    Core (Comment)  SpO2: 99% 100% 100% 100%  Weight:      Height:        Intake/Output Summary (Last 24 hours) at 02/24/17 1258 Last data filed at 02/24/17 0626  Gross per 24 hour  Intake           1752.5 ml  Output              500 ml  Net           1252.5 ml   Filed Weights   02/22/17 0500 02/23/17 0455 02/24/17 0242  Weight: 87.6 kg (193 lb 2 oz) 88.6 kg (195 lb 5.2 oz) 87.4 kg (192 lb 10.9 oz)    Examination:  General exam: Nonverbal, lying in bed comfortable Neck; on ventilator now, trach site clean Respiratory system: Bibasal coarse breath sounds, Cardiovascular system: Irregular heart rate, tachycardic, S1-S2 normal. No pedal edema. Gastrointestinal system: Abdomen is soft and nontender. Normal bowel sounds heard.feeding tube site clean Central nervous system: Alert awake Skin: No rashes, lesions or ulcers Psychiatry: unable to assess     Data Reviewed: I have personally reviewed following labs and imaging studies  CBC:  Recent Labs Lab 02/20/17 0248 02/21/17 0503 02/22/17 0318 02/23/17 0654 02/24/17 0258  WBC 11.6* 14.3* 15.1* 14.2* 25.0*  NEUTROABS 9.4* 12.0* 12.4* 11.5* 21.7*  HGB 10.0* 10.4* 10.8* 11.5* 11.4*  HCT 32.6* 33.9* 34.8* 37.6* 37.5*  MCV 87.4 87.4 87.9 89.7 91.2  PLT 548* 553* 592* 527* 790*   Basic Metabolic Panel:  Recent Labs Lab 02/18/17 0404 02/19/17 0341 02/20/17 0248 02/21/17 0503 02/22/17 0318 02/24/17 0258  NA 139 139 136 139 141 147*  K 3.3* 3.6 3.5 3.5 4.2 4.8  CL 104 105  105 102 103 110  CO2 25 25 25 27 26 26   GLUCOSE 150* 125* 140* 144* 123* 197*  BUN 22* 20 23* 25* 31* 50*  CREATININE 0.82 0.75 0.84 0.86 0.96 1.38*  CALCIUM 8.8* 9.0 9.1 9.6 9.9 10.3  MG 2.0 2.0 2.0 2.1 2.2  --   PHOS 3.3 3.1 3.1 3.9 4.0  --    GFR: Estimated Creatinine Clearance: 57.3 mL/min (A) (by C-G formula based on SCr of 1.38 mg/dL (H)). Liver Function Tests:  Recent Labs Lab 02/21/17 0503  AST 36  ALT 52  ALKPHOS 255*  BILITOT 0.8  PROT 6.6  ALBUMIN 2.6*   No results for input(s): LIPASE, AMYLASE in the last 168 hours. No results for input(s): AMMONIA in the last 168 hours. Coagulation Profile: No results for input(s): INR, PROTIME in the last 168 hours. Cardiac Enzymes: No results for input(s): CKTOTAL, CKMB, CKMBINDEX, TROPONINI in the last 168 hours. BNP (last 3 results) No results for input(s): PROBNP in the last 8760 hours. HbA1C: No results for input(s): HGBA1C in the last 72 hours. CBG:  Recent Labs Lab 02/23/17 1930 02/23/17 2310 02/24/17 0343 02/24/17 0728 02/24/17 1205  GLUCAP  184* 213* 179* 227* 220*   Lipid Profile: No results for input(s): CHOL, HDL, LDLCALC, TRIG, CHOLHDL, LDLDIRECT in the last 72 hours. Thyroid Function Tests: No results for input(s): TSH, T4TOTAL, FREET4, T3FREE, THYROIDAB in the last 72 hours. Anemia Panel: No results for input(s): VITAMINB12, FOLATE, FERRITIN, TIBC, IRON, RETICCTPCT in the last 72 hours. Sepsis Labs:  Recent Labs Lab 02/24/17 0258 02/24/17 0528 02/24/17 0538  PROCALCITON  --  0.19  --   LATICACIDVEN 1.9  --  2.2*    Recent Results (from the past 240 hour(s))  C difficile quick scan w PCR reflex     Status: None   Collection Time: 02/24/17  4:45 AM  Result Value Ref Range Status   C Diff antigen NEGATIVE NEGATIVE Final   C Diff toxin NEGATIVE NEGATIVE Final   C Diff interpretation No C. difficile detected.  Final  Culture, respiratory (NON-Expectorated)     Status: None (Preliminary result)    Collection Time: 02/24/17  8:10 AM  Result Value Ref Range Status   Specimen Description TRACHEAL ASPIRATE  Final   Special Requests NONE  Final   Gram Stain   Final    FEW WBC PRESENT, PREDOMINANTLY PMN FEW SQUAMOUS EPITHELIAL CELLS PRESENT MODERATE GRAM NEGATIVE RODS MODERATE GRAM POSITIVE RODS MODERATE GRAM POSITIVE COCCI    Culture PENDING  Incomplete   Report Status PENDING  Incomplete  MRSA PCR Screening     Status: None   Collection Time: 02/24/17 10:05 AM  Result Value Ref Range Status   MRSA by PCR NEGATIVE NEGATIVE Final    Comment:        The GeneXpert MRSA Assay (FDA approved for NASAL specimens only), is one component of a comprehensive MRSA colonization surveillance program. It is not intended to diagnose MRSA infection nor to guide or monitor treatment for MRSA infections.          Radiology Studies: Dg Chest Port 1 View  Result Date: 02/24/2017 CLINICAL DATA:  Tracheostomy.  Ventilator support.  Fever. EXAM: PORTABLE CHEST 1 VIEW COMPARISON:  02/23/2017 FINDINGS: Tracheostomy remains in place. The lungs remain clear except for mild atelectasis at both lung bases. Early pneumonia not excluded. No effusions. No edema. IMPRESSION: Persistent mild patchy density both lung bases that could be atelectasis or mild pneumonia. No worsening or new finding. Electronically Signed   By: Nelson Chimes M.D.   On: 02/24/2017 08:08   Dg Chest Port 1 View  Result Date: 02/23/2017 CLINICAL DATA:  Shortness of breath. History of COPD, hypertension and stroke. EXAM: PORTABLE CHEST 1 VIEW COMPARISON:  Chest radiograph February 22, 2017 FINDINGS: Stable cardiomegaly. Similar diffuse interstitial prominence without pleural effusion or focal consolidation. Stable tracheostomy tube. No pneumothorax. Healing RIGHT humeral head and neck fracture. LEFT rib ORIF. IMPRESSION: Stable cardiomegaly and interstitial prominence. Electronically Signed   By: Elon Alas M.D.   On:  02/23/2017 00:40        Scheduled Meds: . acetylcysteine  2 mL Nebulization Q8H  . chlorhexidine gluconate (MEDLINE KIT)  15 mL Mouth Rinse BID  . diltiazem  60 mg Oral Q6H  . docusate  100 mg Per Tube Daily  . feeding supplement (PRO-STAT SUGAR FREE 64)  30 mL Per Tube BID  . folic acid  1 mg Per Tube Daily  . furosemide  40 mg Intravenous Daily  . guaiFENesin  10 mL Per Tube Q4H  . heparin subcutaneous  5,000 Units Subcutaneous Q8H  . insulin aspart  0-15 Units  Subcutaneous Q4H  . insulin glargine  25 Units Subcutaneous QHS  . ipratropium-albuterol  3 mL Nebulization Q6H  . lisinopril  20 mg Per Tube Daily  . mouth rinse  15 mL Mouth Rinse Q4H  . metoprolol tartrate  50 mg Per Tube BID  . multivitamin  15 mL Per Tube Daily  . pantoprazole sodium  40 mg Per Tube Q24H  . potassium chloride  40 mEq Per Tube BID  . thiamine  100 mg Per Tube Daily   Continuous Infusions: . feeding supplement (GLUCERNA 1.2 CAL) 1,000 mL (02/24/17 0327)     LOS: 20 days    Dron Tanna Furry, MD [facility] Pager [phone]  If 7PM-7AM, please contact night-coverage [email or web address] Password [email or web address] 02/24/2017, 12:58 PM

## 2017-02-24 NOTE — Progress Notes (Signed)
RN called about the pt a-fib rvr, lopressor IV 5 mg once given. Wbc 25 from 14 and tachycardic, fever 101.9. C-diff pcr pending. Blood cx x 2, sputum cx, procalcitonin, empiric IV antibiotic IV vanc, IV flagyl and IV levaquin. Close monitoring.

## 2017-02-24 NOTE — Progress Notes (Signed)
Linsinopril held due to BP 91/68. When going to call MD noted medication discontinued. Will continue to monitor.

## 2017-02-24 NOTE — Progress Notes (Signed)
MD returned call. Discussed getting Mg level for patient and will continue metoprolol. Will continue to monitor.

## 2017-02-24 NOTE — Progress Notes (Signed)
No charge note:   Spoke with patient's ex wife who is supporting patient's daughter, Marylene Landngela in decision making for patient's care. Family will call PMT in the morning to set time for GOC meeting to be held tomorrow 10/27 during the day.  Ocie BobKasie Mahan, AGNP-C Palliative Medicine  Please call Palliative Medicine team phone with any questions [phone]. For individual providers please see AMION.

## 2017-02-24 NOTE — Progress Notes (Signed)
Pt seen by trach team for trach consult. No education needed at this time. All necessary equipment needed is available at bedside. Will continue to follow for progression.

## 2017-02-25 DIAGNOSIS — Z7189 Other specified counseling: Secondary | ICD-10-CM

## 2017-02-25 DIAGNOSIS — Z515 Encounter for palliative care: Secondary | ICD-10-CM

## 2017-02-25 LAB — BASIC METABOLIC PANEL
ANION GAP: 12 (ref 5–15)
BUN: 46 mg/dL — AB (ref 6–20)
CALCIUM: 9.9 mg/dL (ref 8.9–10.3)
CHLORIDE: 108 mmol/L (ref 101–111)
CO2: 26 mmol/L (ref 22–32)
CREATININE: 1 mg/dL (ref 0.61–1.24)
GFR calc non Af Amer: >60 mL/min (ref >60)
Glucose, Bld: 173 mg/dL — ABNORMAL HIGH (ref 65–99)
POTASSIUM: 4.4 mmol/L (ref 3.5–5.1)
Sodium: 146 mmol/L — ABNORMAL HIGH (ref 135–145)

## 2017-02-25 LAB — CBC
HEMATOCRIT: 36.6 % — AB (ref 39.0–52.0)
HEMOGLOBIN: 10.9 g/dL — AB (ref 13.0–17.0)
MCH: 27.4 pg (ref 26.0–34.0)
MCHC: 29.8 g/dL — ABNORMAL LOW (ref 30.0–36.0)
MCV: 92 fL (ref 78.0–100.0)
Platelets: 386 10*3/uL (ref 150–400)
RBC: 3.98 MIL/uL — AB (ref 4.22–5.81)
RDW: 18.1 % — AB (ref 11.5–15.5)
WBC: 20.9 10*3/uL — AB (ref 4.0–10.5)

## 2017-02-25 LAB — GLUCOSE, CAPILLARY
Glucose-Capillary: 156 mg/dL — ABNORMAL HIGH (ref 65–99)
Glucose-Capillary: 173 mg/dL — ABNORMAL HIGH (ref 65–99)
Glucose-Capillary: 184 mg/dL — ABNORMAL HIGH (ref 65–99)
Glucose-Capillary: 185 mg/dL — ABNORMAL HIGH (ref 65–99)
Glucose-Capillary: 231 mg/dL — ABNORMAL HIGH (ref 65–99)
Glucose-Capillary: 239 mg/dL — ABNORMAL HIGH (ref 65–99)

## 2017-02-25 MED ORDER — FUROSEMIDE 10 MG/ML IJ SOLN
20.0000 mg | Freq: Every day | INTRAMUSCULAR | Status: DC
  Administered 2017-02-25 – 2017-02-26 (×2): 20 mg via INTRAVENOUS
  Filled 2017-02-25 (×2): qty 2

## 2017-02-25 MED ORDER — MORPHINE SULFATE (PF) 4 MG/ML IV SOLN: 1.0000 mg | INTRAVENOUS | Status: DC | PRN

## 2017-02-25 MED ORDER — DILTIAZEM 12 MG/ML ORAL SUSPENSION
90.0000 mg | Freq: Four times a day (QID) | ORAL | Status: DC
  Administered 2017-02-25 – 2017-03-02 (×22): 90 mg via ORAL
  Filled 2017-02-25 (×22): qty 9

## 2017-02-25 NOTE — Progress Notes (Signed)
PROGRESS NOTE    Stuart Mendoza  EGB:[id number] DOB: 1955/12/13 DOA: 02/04/2017 PCP: Patient, No Pcp Per   Brief Narrative: 61 year old male with past medical history of A. fib on Xarelto, COPD, hypertension, diabetes, alcohol abuse who was admitted on 10/6 with large right parietal ICH and IVH, and a 1.7 cm midline shift with hydrocephalus.  Also was found to be in acute hypoxic respiratory failure due to hospital-acquired pneumonia and subsequently intubated. Patient was unable to be weaned off vent, trach was placed. PEG also performed. Remained stable and has been transferred to [facility] for further care.   SIGNIFICANT EVENTS: 10/6 Admit, neurology, neurosurgery consulted, kcentra, 3% NS  10/12 remains with reasonable neurostatus; s/p Tracheostomy 10/13: Tolerated ATC All day 10/14: Went back on vent 10/14 PM due to dyspnea.  10/15: minimally responsive which sounds worse than prior baseline last week based on EMR notes. on TC trial has RR 35-40 and thick purulent secretions from Trach. 10/16: fever, pan-cultured, cuff leak so trach exchanged for #6 distal XLT; restart zosyn 10/17: increased tachycardia, thick tan secretions, tmax 98.8, failed SBT secondary to rr in 40's, per RT- multiple mucous plugs requiring bag/lavage 10/18: HR better controlled, tmax 102.1 but improving, Na improving with free water, following commands on right. Still having mucous plugging 10/19: PEG placed by Trauma surgery; tolerated 2hrs of PSV 10/20: RT says no further mucous plugs but does have an intermittent cuff leak that is positional (still getting good tidal volumes in 500's despite leak)  Assessment & Plan:  # ICH/IVH: -Seen by neurologist, recommended MRI in 6 weeks and outpatient follow-up. -PT OT therapy. Patient likely needs LTAC or SNF on discharge -he opens eyes nonverbal.  # Acute respiratory failure with hypoxia/HCAP, mucous plugging and acute pulmonary edema -Status post trach  10/12 -Sputum cultures grew Pseudomonas and staph lugdenesis, completed abx course.Zosyn for 7 days. -Patient is  on the ventilator. He had temperature of 103 and respiratory distress on 10/25 nights, started IV zosyn for possible aspiration pneumonia.MRSA screen negative. WBC trending down. -resume low dose IV Lasix.  -continue chest PT and pulmonary toilet.   # PAF, Afib with RVR:  -HR still elevated. I increased the dose of diltiazem to 90 mg every 6 hours. Continue metoprolol. Monitor heart rate and blood pressure closely. -Off anticoagulation now given intracranial hemorrhage. Neurology recommending hold a/c until MRI is repeated in 6 weeks. Patient has been on Diltiazem through tube feed q. 6-hour. Monitor HR  #Hypertension: continue metoprolol, diltiazem, lasix. Monitor blood pressure. Hold lisinopril   #Diabetes type 2: continue current insulin regimen.Monitor blood sugar level.  # Hypernatrema/Hypokalemia -resolved Monitor BMP   # Anemia of chronic disease and critical illness Hemoglobin remains stable.  # AKI due to Afib w RVR/ACEI/diuretics: improved. Lisinopril on hold.  # diarrhea due to tube feed: c diff negative/.  # goals of care: Patient is not able to participate in discussion. He remains nonverbal with no clinical improvement. He has prolonged hospitalization with multiple comorbidities. I believe patient will benefit from hospice care and DO NOT RESUSCITATE status. Palliative care planning for family meeting today.  Try to wean to trach. If patient is vent dependent he may need to go to LTAC. Case Freight forwarder and Education officer, museum following.  DVT prophylaxis: SCD Code Status:full code Family Communication:no family at bedside Disposition Plan:Currently admitted    Consultants:   PCCM  Neurology   Palliative care  Procedures: ETT 10/06 >> 10/12 Trach #6 Shiley (DF) 10/12 >> 10/16 Trach #  6 Distal XLT 10/16 >> LSC TLC 10/6 >>  10/15  Antimicrobials: Vancomycin 10/6 >>10/8 Zosyn 10/6 >> 10/7; 10/16 >> CTX 10/8>>> 10/14  Subjective: Patient was seen and examined at bedside. On ventilator. Open eyes only. Review of systems Limited.  Objective: Vitals:   02/25/17 0600 02/25/17 0811 02/25/17 0837 02/25/17 1101  BP: (!) 142/80 137/83 137/83 (!) 150/86  Pulse:  97 (!) 107 (!) 140  Resp: (!) 23 20 (!) 26 20  Temp:  99 F (37.2 C)    TempSrc:  Rectal    SpO2:  100% 97% 99%  Weight:      Height:        Intake/Output Summary (Last 24 hours) at 02/25/17 1117 Last data filed at 02/25/17 0600  Gross per 24 hour  Intake             1890 ml  Output             1250 ml  Net              640 ml   Filed Weights   02/23/17 0455 02/24/17 0242 02/25/17 0249  Weight: 88.6 kg (195 lb 5.2 oz) 87.4 kg (192 lb 10.9 oz) 90.5 kg (199 lb 8.3 oz)    Examination:  General exam: nonverbal, on ventilator, Neck;  trach site clean Respiratory system:bilateral coarse breath sound, Cardiovascular system: tachycardic irregular heart rate, S1-S2 normal. Gastrointestinal system: Abdomen is soft and nontender. Normal bowel sounds heard.feeding tube site clean Central nervous system: Alert awake Skin: No rashes, lesions or ulcers Psychiatry: unable to assess     Data Reviewed: I have personally reviewed following labs and imaging studies  CBC:  Recent Labs Lab 02/20/17 0248 02/21/17 0503 02/22/17 0318 02/23/17 0654 02/24/17 0258 02/25/17 0539  WBC 11.6* 14.3* 15.1* 14.2* 25.0* 20.9*  NEUTROABS 9.4* 12.0* 12.4* 11.5* 21.7*  --   HGB 10.0* 10.4* 10.8* 11.5* 11.4* 10.9*  HCT 32.6* 33.9* 34.8* 37.6* 37.5* 36.6*  MCV 87.4 87.4 87.9 89.7 91.2 92.0  PLT 548* 553* 592* 527* 546* 542   Basic Metabolic Panel:  Recent Labs Lab 02/19/17 0341 02/20/17 0248 02/21/17 0503 02/22/17 0318 02/24/17 0258 02/24/17 1921 02/25/17 0539  NA 139 136 139 141 147*  --  146*  K 3.6 3.5 3.5 4.2 4.8  --  4.4  CL 105 105 102 103  110  --  108  CO2 25 25 27 26 26   --  26  GLUCOSE 125* 140* 144* 123* 197*  --  173*  BUN 20 23* 25* 31* 50*  --  46*  CREATININE 0.75 0.84 0.86 0.96 1.38*  --  1.00  CALCIUM 9.0 9.1 9.6 9.9 10.3  --  9.9  MG 2.0 2.0 2.1 2.2  --  2.5*  --   PHOS 3.1 3.1 3.9 4.0  --   --   --    GFR: Estimated Creatinine Clearance: 86.7 mL/min (by C-G formula based on SCr of 1 mg/dL). Liver Function Tests:  Recent Labs Lab 02/21/17 0503  AST 36  ALT 52  ALKPHOS 255*  BILITOT 0.8  PROT 6.6  ALBUMIN 2.6*   No results for input(s): LIPASE, AMYLASE in the last 168 hours. No results for input(s): AMMONIA in the last 168 hours. Coagulation Profile: No results for input(s): INR, PROTIME in the last 168 hours. Cardiac Enzymes: No results for input(s): CKTOTAL, CKMB, CKMBINDEX, TROPONINI in the last 168 hours. BNP (last 3 results) No results for input(s): PROBNP  in the last 8760 hours. HbA1C: No results for input(s): HGBA1C in the last 72 hours. CBG:  Recent Labs Lab 02/24/17 1612 02/24/17 2100 02/24/17 2317 02/25/17 0318 02/25/17 0809  GLUCAP 206* 206* 222* 156* 185*   Lipid Profile: No results for input(s): CHOL, HDL, LDLCALC, TRIG, CHOLHDL, LDLDIRECT in the last 72 hours. Thyroid Function Tests: No results for input(s): TSH, T4TOTAL, FREET4, T3FREE, THYROIDAB in the last 72 hours. Anemia Panel: No results for input(s): VITAMINB12, FOLATE, FERRITIN, TIBC, IRON, RETICCTPCT in the last 72 hours. Sepsis Labs:  Recent Labs Lab 02/24/17 0258 02/24/17 0528 02/24/17 0538  PROCALCITON  --  0.19  --   LATICACIDVEN 1.9  --  2.2*    Recent Results (from the past 240 hour(s))  C difficile quick scan w PCR reflex     Status: None   Collection Time: 02/24/17  4:45 AM  Result Value Ref Range Status   C Diff antigen NEGATIVE NEGATIVE Final   C Diff toxin NEGATIVE NEGATIVE Final   C Diff interpretation No C. difficile detected.  Final  Culture, blood (routine x 2)     Status: None  (Preliminary result)   Collection Time: 02/24/17  5:28 AM  Result Value Ref Range Status   Specimen Description BLOOD RIGHT HAND  Final   Special Requests   Final    BOTTLES DRAWN AEROBIC ONLY Blood Culture adequate volume   Culture NO GROWTH 1 DAY  Final   Report Status PENDING  Incomplete  Culture, blood (routine x 2)     Status: None (Preliminary result)   Collection Time: 02/24/17  5:38 AM  Result Value Ref Range Status   Specimen Description BLOOD LEFT HAND  Final   Special Requests   Final    BOTTLES DRAWN AEROBIC ONLY Blood Culture adequate volume   Culture NO GROWTH 1 DAY  Final   Report Status PENDING  Incomplete  Culture, respiratory (NON-Expectorated)     Status: None (Preliminary result)   Collection Time: 02/24/17  8:10 AM  Result Value Ref Range Status   Specimen Description TRACHEAL ASPIRATE  Final   Special Requests NONE  Final   Gram Stain   Final    FEW WBC PRESENT, PREDOMINANTLY PMN FEW SQUAMOUS EPITHELIAL CELLS PRESENT MODERATE GRAM NEGATIVE RODS MODERATE GRAM POSITIVE RODS MODERATE GRAM POSITIVE COCCI    Culture PENDING  Incomplete   Report Status PENDING  Incomplete  MRSA PCR Screening     Status: None   Collection Time: 02/24/17 10:05 AM  Result Value Ref Range Status   MRSA by PCR NEGATIVE NEGATIVE Final    Comment:        The GeneXpert MRSA Assay (FDA approved for NASAL specimens only), is one component of a comprehensive MRSA colonization surveillance program. It is not intended to diagnose MRSA infection nor to guide or monitor treatment for MRSA infections.          Radiology Studies: Dg Chest Port 1 View  Result Date: 02/24/2017 CLINICAL DATA:  Tracheostomy.  Ventilator support.  Fever. EXAM: PORTABLE CHEST 1 VIEW COMPARISON:  02/23/2017 FINDINGS: Tracheostomy remains in place. The lungs remain clear except for mild atelectasis at both lung bases. Early pneumonia not excluded. No effusions. No edema. IMPRESSION: Persistent mild  patchy density both lung bases that could be atelectasis or mild pneumonia. No worsening or new finding. Electronically Signed   By: Nelson Chimes M.D.   On: 02/24/2017 08:08        Scheduled Meds: .  acetylcysteine  2 mL Nebulization Q8H  . chlorhexidine gluconate (MEDLINE KIT)  15 mL Mouth Rinse BID  . diltiazem  90 mg Oral Q6H  . docusate  100 mg Per Tube Daily  . feeding supplement (PRO-STAT SUGAR FREE 64)  30 mL Per Tube BID  . folic acid  1 mg Per Tube Daily  . guaiFENesin  10 mL Per Tube Q4H  . heparin subcutaneous  5,000 Units Subcutaneous Q8H  . insulin aspart  0-15 Units Subcutaneous Q4H  . insulin glargine  25 Units Subcutaneous QHS  . ipratropium-albuterol  3 mL Nebulization Q6H  . mouth rinse  15 mL Mouth Rinse Q4H  . metoprolol tartrate  50 mg Per Tube BID  . multivitamin  15 mL Per Tube Daily  . pantoprazole sodium  40 mg Per Tube Q24H  . potassium chloride  40 mEq Per Tube BID  . thiamine  100 mg Per Tube Daily   Continuous Infusions: . feeding supplement (GLUCERNA 1.2 CAL) 1,000 mL (02/24/17 2227)  . piperacillin-tazobactam (ZOSYN)  IV 3.375 g (02/25/17 0542)     LOS: 21 days    Stuart Viviano Tanna Furry, MD [facility] Pager [phone]  If 7PM-7AM, please contact night-coverage [email or web address] Password [email or web address] 02/25/2017, 11:17 AM

## 2017-02-25 NOTE — Progress Notes (Signed)
Metoprolol 5 mg IVP given for HR 115-170 in Afib with RVR.  BP holding at 174/77.  Will continue to monitor.

## 2017-02-25 NOTE — Plan of Care (Signed)
Problem: Safety: Goal: Ability to remain free from injury will improve Outcome: Progressing Pt HR continues to be elevated and pt is semi-responsive today.  Will continue to monitor.  Problem: Health Behavior/Discharge Planning: Goal: Ability to manage health-related needs will improve Outcome: Not Progressing Pt overall condition is not improving.  Now on cooling blanket to keep temps controlled  Problem: Pain Managment: Goal: General experience of comfort will improve Outcome: Progressing No signs of pain at this time.

## 2017-02-25 NOTE — Progress Notes (Signed)
Palliative care has arrived and is having meeting with family who has also arrived regarding pt condition and prognosis.

## 2017-02-25 NOTE — Consult Note (Signed)
Consultation Note Date: 02/25/2017   Patient Name: Stuart Mendoza  DOB: 02-20-1956  MRN: [id number]  Age / Sex: 61 y.o., male  PCP: Patient, No Pcp Per Referring Physician: Rosita Fire, MD  Reason for Consultation: Establishing goals of care and Psychosocial/spiritual support  HPI/Patient Profile: 61 y.o. male  with past medical history of A. Fib on Xarelto, prior stroke, COPD, DM2, alcohol abuse, and HTN. He presented to the ED obtunded, and was intubated for airway protection. He was admitted on 02/04/2017. Imaging revealed a RLL PNA and a very large right parietal intraparenchymal hemorrhage with ventricular extension, evolving hydrocephalus, and 1.5cm midline shift. He was not able to be weaned from vent and he had both a trach and PEG placed. At this point he will open his eyes and follow simple commands, but is nonverbal. Next of kin is his daughter, Levada Dy. Palliative consulted to assist in clarifying goals of care.   Clinical Assessment and Goals of Care: Stuart Mendoza is unable to participate in a goals of care conversation due to his multiple medical issues. His next of kin is his daughter, Levada Dy. I met with Levada Dy, her brother (who is cognitively unable to participate in a [place] conversation), her mother/pt's ex-wife, and a family friend. We talked through Stuart Mendoza's multiple medical issues, including what had been done to manage them to date. We next went through two different care trajectories: ongoing aggressive care versus a comfort care. I shared the intent of each path, what care would look like, and the likely outcome.   After a good discussion on options, Levada Dy was able to verbalize that her father would not want to be kept alive in a prolonged state of debility. Her goal for him is to ensure comfort and dignity, and not prolong his suffering. We talked about the transition to comfort care, namely liberating him from medications and  interventions that lend to life prolonging, not quality producing. We specifically discussed stopping tube feeds, stopping IV fluids and antibiotics, and planning to stop supplemental O2 support/vent. While doing this, we would also start continuous medication to keep him comfortable. Levada Dy was in agreement with this, but asked for that transition to occur once his brothers had time to get here, which will likely be tomorrow. In the interim, continue current interventions, ensure comfort, and don't escalate care.   Primary Decision Maker NEXT OF KIN, pt's daughter: Levada Dy.   SUMMARY OF RECOMMENDATIONS    DNR  Plan to continue supportive care today, do not escalate. Extended family working to get here, with plan to transition to full comfort care tomorrow. I will call daughter in the morning to coordinate a time as she wants her family here when those changes are ordered (expect transition to full comfort to occur in early afternoon).   ADDENDUM: Spoke with Levada Dy. Her uncles will arrive on Monday, with plan for 2pm transition to full comfort measures. Continue current level of supportive care until that point. Do Not Escalate. If pt declines, ensure comfort is maintained and update Angela.   Code Status/Advance Care Planning:  DNR  Symptom Management:   Pain, dyspnea: will start PRN IV morphine today. Will order palliative drip tomorrow once timing of transition to full comfort measures clarified with Levada Dy.   Palliative Prophylaxis:   Frequent Pain Assessment, Oral Care and Turn Reposition  Additional Recommendations (Limitations, Scope, Preferences):  Continue current level of care, do not escalate. Plan to transition to full comfort measures tomorrow once family can get here. If  pt deteriorates overnight, call Levada Dy to notify, comfort is priority.   Psycho-social/Spiritual:   Desire for further Chaplaincy support:no  Prognosis:   Hours - Days, I anticipate a hospital death  once he transitions to full comfort measures.   Discharge Planning: Anticipated Hospital Death      Primary Diagnoses: Present on Admission: . ICH (intracerebral hemorrhage) ([place])   I have reviewed the medical record, interviewed the patient and family, and examined the patient. The following aspects are pertinent.  Past Medical History:  Diagnosis Date  . Atrial fibrillation ([place])   . COPD (chronic obstructive pulmonary disease) ([place])   . Diabetes mellitus without complication ([place])   . Hypertension   . Hypokalemia   . Hyponatremia   . Stroke [facility]) 02/04/2017   [place]   Social History   Social History  . Marital status: Single    Spouse name: N/A  . Number of children: N/A  . Years of education: N/A   Social History Main Topics  . Smoking status: Current Every Day Smoker  . Smokeless tobacco: Never Used  . Alcohol use Yes  . Drug use: No  . Sexual activity: Not Asked   Other Topics Concern  . None   Social History Narrative  . None   History reviewed. No pertinent family history. Scheduled Meds: . acetylcysteine  2 mL Nebulization Q8H  . chlorhexidine gluconate (MEDLINE KIT)  15 mL Mouth Rinse BID  . diltiazem  60 mg Oral Q6H  . docusate  100 mg Per Tube Daily  . feeding supplement (PRO-STAT SUGAR FREE 64)  30 mL Per Tube BID  . folic acid  1 mg Per Tube Daily  . guaiFENesin  10 mL Per Tube Q4H  . heparin subcutaneous  5,000 Units Subcutaneous Q8H  . insulin aspart  0-15 Units Subcutaneous Q4H  . insulin glargine  25 Units Subcutaneous QHS  . ipratropium-albuterol  3 mL Nebulization Q6H  . mouth rinse  15 mL Mouth Rinse Q4H  . metoprolol tartrate  50 mg Per Tube BID  . multivitamin  15 mL Per Tube Daily  . pantoprazole sodium  40 mg Per Tube Q24H  . potassium chloride  40 mEq Per Tube BID  . thiamine  100 mg Per Tube Daily   Continuous Infusions: . feeding supplement (GLUCERNA 1.2 CAL) 1,000 mL (02/24/17 2227)  . piperacillin-tazobactam (ZOSYN)  IV  3.375 g (02/25/17 0542)   PRN Meds:.acetaminophen (TYLENOL) oral liquid 160 mg/5 mL **OR** acetaminophen, bisacodyl, iopamidol, metoprolol tartrate, ondansetron (ZOFRAN) IV Allergies  Allergen Reactions  . Metformin And Related    Review of Systems  Unable to perform ROS   Physical Exam  Constitutional:  Chronically ill appearing man, obtunded, in bed.  HENT:  Head: Normocephalic and atraumatic.  Mouth/Throat: Oropharynx is clear and moist. No oropharyngeal exudate.  Cardiovascular: An irregularly irregular rhythm present. Tachycardia present.   Pulmonary/Chest:  Trach, vent.  Abdominal:  PEG  Musculoskeletal:  Non-purposeful movement of RUE/RLE. No movement on left.   Neurological:  Obtunded. Will briefly open eyes to voice but not tracking or following any commands.   Psychiatric:  Predominantly unresponsive. Calm in bed.    Vital Signs: BP (!) 142/80   Pulse (!) 39   Temp 98.9 F (37.2 C) (Rectal)   Resp (!) 23   Ht '5\' 10"'$  (1.778 m)   Wt 90.5 kg (199 lb 8.3 oz)   SpO2 100%   BMI 28.63 kg/m  Pain Assessment: CPOT   Pain Score:  3   SpO2: SpO2: 100 % O2 Device:SpO2: 100 % O2 Flow Rate: .O2 Flow Rate (L/min): 8 L/min  IO: Intake/output summary:  Intake/Output Summary (Last 24 hours) at 02/25/17 0813 Last data filed at 02/25/17 0600  Gross per 24 hour  Intake             1890 ml  Output             1250 ml  Net              640 ml    LBM: Last BM Date: 02/24/17 Baseline Weight: Weight: 92.6 kg (204 lb 2.3 oz) Most recent weight: Weight: 90.5 kg (199 lb 8.3 oz)     Palliative Assessment/Data: PPS 10%    Time Total: 70 minutes Greater than 50%  of this time was spent counseling and coordinating care related to the above assessment and plan.  Signed by: Charlynn Court, NP Palliative Medicine Team Pager # [phone] (M-F 7a-5p) Team Phone # [phone] (Nights/Weekends)

## 2017-02-26 DIAGNOSIS — Z515 Encounter for palliative care: Secondary | ICD-10-CM

## 2017-02-26 LAB — GLUCOSE, CAPILLARY
GLUCOSE-CAPILLARY: 89 mg/dL (ref 65–99)
GLUCOSE-CAPILLARY: 163 mg/dL — AB (ref 65–99)
GLUCOSE-CAPILLARY: 135 mg/dL — AB (ref 65–99)
Glucose-Capillary: 147 mg/dL — ABNORMAL HIGH (ref 65–99)

## 2017-02-26 LAB — CULTURE, RESPIRATORY

## 2017-02-26 LAB — CULTURE, RESPIRATORY W GRAM STAIN

## 2017-02-26 MED ORDER — SODIUM CHLORIDE 0.9 % IV SOLN
2.0000 mg/h | INTRAVENOUS | Status: DC
  Administered 2017-02-26 (×5): 2 mg/h via INTRAVENOUS
  Filled 2017-02-26: qty 10

## 2017-02-26 MED ORDER — BIOTENE DRY MOUTH MT LIQD: 15.0000 mL | OROMUCOSAL | Status: DC | PRN

## 2017-02-26 MED ORDER — ACETAMINOPHEN 160 MG/5ML PO SOLN
650.0000 mg | Freq: Four times a day (QID) | ORAL | Status: DC
  Administered 2017-02-26 – 2017-03-02 (×17): 650 mg
  Filled 2017-02-26 (×17): qty 20.3

## 2017-02-26 MED ORDER — MORPHINE BOLUS VIA INFUSION
2.0000 mg | INTRAVENOUS | Status: DC | PRN
  Administered 2017-02-26 – 2017-02-27 (×3): 2 mg via INTRAVENOUS
  Administered 2017-02-28 (×2): 1 mg via INTRAVENOUS
  Administered 2017-02-28: 2 mg via INTRAVENOUS
  Administered 2017-03-02: 1 mg via INTRAVENOUS
  Administered 2017-03-02 (×2): 2 mg via INTRAVENOUS
  Filled 2017-02-26: qty 2

## 2017-02-26 MED ORDER — IPRATROPIUM-ALBUTEROL 0.5-2.5 (3) MG/3ML IN SOLN: 3.0000 mL | Freq: Four times a day (QID) | RESPIRATORY_TRACT | Status: DC | PRN

## 2017-02-26 MED ORDER — LORAZEPAM 2 MG/ML IJ SOLN: 1.0000 mg | INTRAMUSCULAR | Status: DC | PRN

## 2017-02-26 MED ORDER — POLYVINYL ALCOHOL 1.4 % OP SOLN: 1.0000 [drp] | Freq: Four times a day (QID) | OPHTHALMIC | Status: DC | PRN

## 2017-02-26 NOTE — Progress Notes (Signed)
Pt was made comfort care.  Family with patient.  Took pt off ventilator and placed on 28% aerosol trach collar

## 2017-02-26 NOTE — Progress Notes (Signed)
PROGRESS NOTE    Stuart Mendoza  BMW:[id number] DOB: [birth year] DOA: 02/04/2017 PCP: Patient, No Pcp Per   Brief Narrative: 61 year old male with past medical history of A. fib on Xarelto, COPD, hypertension, diabetes, alcohol abuse who was admitted on 10/6 with large right parietal ICH and IVH, and a 1.7 cm midline shift with hydrocephalus.  Also was found to be in acute hypoxic respiratory failure due to hospital-acquired pneumonia and subsequently intubated. Patient was unable to be weaned off vent, trach was placed. PEG also performed. Remained stable and has been transferred to [facility] for further care.   SIGNIFICANT EVENTS: 10/6 Admit, neurology, neurosurgery consulted, kcentra, 3% NS  10/12 remains with reasonable neurostatus; s/p Tracheostomy 10/13: Tolerated ATC All day 10/14: Went back on vent 10/14 PM due to dyspnea.  10/15: minimally responsive which sounds worse than prior baseline last week based on EMR notes. on TC trial has RR 35-40 and thick purulent secretions from Trach. 10/16: fever, pan-cultured, cuff leak so trach exchanged for #6 distal XLT; restart zosyn 10/17: increased tachycardia, thick tan secretions, tmax 98.8, failed SBT secondary to rr in 40's, per RT- multiple mucous plugs requiring bag/lavage 10/18: HR better controlled, tmax 102.1 but improving, Na improving with free water, following commands on right. Still having mucous plugging 10/19: PEG placed by Trauma surgery; tolerated 2hrs of PSV 10/20: RT says no further mucous plugs but does have an intermittent cuff leak that is positional (still getting good tidal volumes in 500's despite leak) 10/27 palliative care met with family and plan for comfort care starting from 10/28 when all family arrives.  Assessment & Plan:  # goals of care: Patient with no significant clinical improvement.He is requiring ventilator. Discussed with the palliative care team. Had a family meeting yesterday. Plan for full comfort  care starting from today afternoon.Patient may need residential hospice. No escalation in therapy. Discussed with the patient's nurse at bedside.  # ICH/IVH: -Seen by neurologist, recommended MRI in 6 weeks and outpatient follow-up. -PT OT therapy.  -he opens eyes and intermittent minimal verbal  # Acute respiratory failure with hypoxia/HCAP, mucous plugging and acute pulmonary edema -Status post trach 10/12 -Sputum cultures grew Pseudomonas and staph lugdenesis, completed abx course.Zosyn for 7 days. -Patient is  on the ventilator. He had temperature of 103 and respiratory distress on 10/25 nights, started IV zosyn for possible aspiration pneumonia.MRSA screen negative. WBC trending down. -continue low dose IV Lasix.  -continue chest PT and pulmonary toilet.   # PAF, Afib with RVR:  -continue diltiazem and metoprolol. Monitor heart rate and blood pressure closely. -Off anticoagulation now given intracranial hemorrhage. Neurology recommending hold a/c until MRI is repeated in 6 weeks. Patient has been on Diltiazem through tube feed q. 6-hour. Monitor HR  #Hypertension: continue metoprolol, diltiazem, lasix. Monitor blood pressure. Hold lisinopril   #Diabetes type 2: continue current insulin regimen.Monitor blood sugar level.  # Hypernatrema/Hypokalemia -resolved Monitor BMP   # Anemia of chronic disease and critical illness Hemoglobin remains stable.  # AKI due to Afib w RVR/ACEI/diuretics: improved. Lisinopril on hold.  # diarrhea due to tube feed: c diff negative/.  DVT prophylaxis: SCD Code Status:full code Family Communication:no family at bedside Disposition Plan:Currently admitted    Consultants:   PCCM  Neurology   Palliative care  Procedures: ETT 10/06 >> 10/12 Trach #6 [place]iley (DF) 10/12 >> 10/16 Trach #6 Distal XLT 10/16 >> LSC TLC 10/6 >> 10/15  Antimicrobials: Vancomycin 10/6 >>10/8 Zosyn 10/6 >> 10/7; 10/16 >>  CTX 10/8>>>  10/14  Subjective: Patient was seen and examined at bedside. On ventilator. Opens eyes and intermittent minimal verbal. Unable to obtain review of system.  Objective: Vitals:   02/26/17 0331 02/26/17 0424 02/26/17 0742 02/26/17 0745  BP: 113/69 102/67 111/70   Pulse: 99 (!) 101 93   Resp: 18 17 (!) 21   Temp: 99 F (37.2 C)  99.6 F (37.6 C)   TempSrc: Core (Comment)  Core (Comment)   SpO2: 100% 99% 100% 100%  Weight: 89.6 kg (197 lb 8.5 oz)     Height:        Intake/Output Summary (Last 24 hours) at 02/26/17 1026 Last data filed at 02/26/17 0533  Gross per 24 hour  Intake          1580.75 ml  Output             1500 ml  Net            80.75 ml   Filed Weights   02/24/17 0242 02/25/17 0249 02/26/17 0331  Weight: 87.4 kg (192 lb 10.9 oz) 90.5 kg (199 lb 8.3 oz) 89.6 kg (197 lb 8.5 oz)    Examination:  General exam: ill-looking ale lying on bed, open eyes Neck;  trach site clean Respiratory system:bibasal coarse breath sound Cardiovascular system: tachycardic irregular heart rate, S1-S2 normal. Gastrointestinal system: Abdomen is soft and nontender. Normal bowel sounds heard.feeding tube site clean Central nervous system: alert awake but not following commands. Skin: No rashes, lesions or ulcers Psychiatry: unable to assess     Data Reviewed: I have personally reviewed following labs and imaging studies  CBC:  Recent Labs Lab 02/20/17 0248 02/21/17 0503 02/22/17 0318 02/23/17 0654 02/24/17 0258 02/25/17 0539  WBC 11.6* 14.3* 15.1* 14.2* 25.0* 20.9*  NEUTROABS 9.4* 12.0* 12.4* 11.5* 21.7*  --   HGB 10.0* 10.4* 10.8* 11.5* 11.4* 10.9*  HCT 32.6* 33.9* 34.8* 37.6* 37.5* 36.6*  MCV 87.4 87.4 87.9 89.7 91.2 92.0  PLT 548* 553* 592* 527* 546* 712   Basic Metabolic Panel:  Recent Labs Lab 02/20/17 0248 02/21/17 0503 02/22/17 0318 02/24/17 0258 02/24/17 1921 02/25/17 0539  NA 136 139 141 147*  --  146*  K 3.5 3.5 4.2 4.8  --  4.4  CL 105 102 103 110   --  108  CO2 _0 --  26  GLUCOSE 140* 144* 123* 197*  --  173*  BUN 23* 25* 31* 50*  --  46*  CREATININE 0.84 0.86 0.96 1.38*  --  1.00  CALCIUM 9.1 9.6 9.9 10.3  --  9.9  MG 2.0 2.1 2.2  --  2.5*  --   PHOS 3.1 3.9 4.0  --   --   --    GFR: Estimated Creatinine Clearance: 86.2 mL/min (by C-G formula based on SCr of 1 mg/dL). Liver Function Tests:  Recent Labs Lab 02/21/17 0503  AST 36  ALT 52  ALKPHOS 255*  BILITOT 0.8  PROT 6.6  ALBUMIN 2.6*   No results for input(s): LIPASE, AMYLASE in the last 168 hours. No results for input(s): AMMONIA in the last 168 hours. Coagulation Profile: No results for input(s): INR, PROTIME in the last 168 hours. Cardiac Enzymes: No results for input(s): CKTOTAL, CKMB, CKMBINDEX, TROPONINI in the last 168 hours. BNP (last 3 results) No results for input(s): PROBNP in the last 8760 hours. HbA1C: No results for input(s): HGBA1C in the last 72 hours. CBG:  Recent  Labs Lab 02/25/17 1217 02/25/17 1536 02/25/17 2030 02/25/17 2346 02/26/17 0333  GLUCAP 239* 231* 173* 184* 89   Lipid Profile: No results for input(s): CHOL, HDL, LDLCALC, TRIG, CHOLHDL, LDLDIRECT in the last 72 hours. Thyroid Function Tests: No results for input(s): TSH, T4TOTAL, FREET4, T3FREE, THYROIDAB in the last 72 hours. Anemia Panel: No results for input(s): VITAMINB12, FOLATE, FERRITIN, TIBC, IRON, RETICCTPCT in the last 72 hours. Sepsis Labs:  Recent Labs Lab 02/24/17 0258 02/24/17 0528 02/24/17 0538  PROCALCITON  --  0.19  --   LATICACIDVEN 1.9  --  2.2*    Recent Results (from the past 240 hour(s))  C difficile quick scan w PCR reflex     Status: None   Collection Time: 02/24/17  4:45 AM  Result Value Ref Range Status   C Diff antigen NEGATIVE NEGATIVE Final   C Diff toxin NEGATIVE NEGATIVE Final   C Diff interpretation No C. difficile detected.  Final  Culture, blood (routine x 2)     Status: None (Preliminary result)   Collection Time:  02/24/17  5:28 AM  Result Value Ref Range Status   Specimen Description BLOOD RIGHT HAND  Final   Special Requests   Final    BOTTLES DRAWN AEROBIC ONLY Blood Culture adequate volume   Culture NO GROWTH 1 DAY  Final   Report Status PENDING  Incomplete  Culture, blood (routine x 2)     Status: None (Preliminary result)   Collection Time: 02/24/17  5:38 AM  Result Value Ref Range Status   Specimen Description BLOOD LEFT HAND  Final   Special Requests   Final    BOTTLES DRAWN AEROBIC ONLY Blood Culture adequate volume   Culture NO GROWTH 1 DAY  Final   Report Status PENDING  Incomplete  Culture, respiratory (NON-Expectorated)     Status: None   Collection Time: 02/24/17  8:10 AM  Result Value Ref Range Status   Specimen Description TRACHEAL ASPIRATE  Final   Special Requests NONE  Final   Gram Stain   Final    FEW WBC PRESENT, PREDOMINANTLY PMN FEW SQUAMOUS EPITHELIAL CELLS PRESENT MODERATE GRAM NEGATIVE RODS MODERATE GRAM POSITIVE RODS MODERATE GRAM POSITIVE COCCI    Culture MODERATE PSEUDOMONAS AERUGINOSA  Final   Report Status 02/26/2017 FINAL  Final   Organism ID, Bacteria PSEUDOMONAS AERUGINOSA  Final      Susceptibility   Pseudomonas aeruginosa - MIC*    CEFTAZIDIME 4 SENSITIVE Sensitive     CIPROFLOXACIN 0.5 SENSITIVE Sensitive     GENTAMICIN <=1 SENSITIVE Sensitive     IMIPENEM 1 SENSITIVE Sensitive     PIP/TAZO 32 SENSITIVE Sensitive     CEFEPIME 8 SENSITIVE Sensitive     * MODERATE PSEUDOMONAS AERUGINOSA  MRSA PCR Screening     Status: None   Collection Time: 02/24/17 10:05 AM  Result Value Ref Range Status   MRSA by PCR NEGATIVE NEGATIVE Final    Comment:        The GeneXpert MRSA Assay (FDA approved for NASAL specimens only), is one component of a comprehensive MRSA colonization surveillance program. It is not intended to diagnose MRSA infection nor to guide or monitor treatment for MRSA infections.          Radiology Studies: No results  found.      Scheduled Meds: . acetylcysteine  2 mL Nebulization Q8H  . chlorhexidine gluconate (MEDLINE KIT)  15 mL Mouth Rinse BID  . diltiazem  90 mg Oral Q6H  .  docusate  100 mg Per Tube Daily  . guaiFENesin  10 mL Per Tube Q4H  . ipratropium-albuterol  3 mL Nebulization Q6H  . mouth rinse  15 mL Mouth Rinse Q4H  . metoprolol tartrate  50 mg Per Tube BID  . pantoprazole sodium  40 mg Per Tube Q24H   Continuous Infusions: . feeding supplement (GLUCERNA 1.2 CAL) 1,000 mL (02/26/17 0016)  . morphine       LOS: 22 days    Zonnique Norkus Tanna Furry, MD [facility] Pager [phone]  If 7PM-7AM, please contact night-coverage [email or web address] Password [email or web address] 02/26/2017, 10:26 AM

## 2017-02-26 NOTE — Progress Notes (Signed)
Palliative Care  I spoke with Stuart Mendoza again this morning. Her family will be able to make it to the hospital today, and they would like to proceed with transitioning to full comfort measures at 1400 today once they have arrived. I will place orders in the chart, to include starting a morphine drip.   Murrell ConverseSarah Kelda Azad AGNP-C Palliative Care [phone]  Full Note to Follow.

## 2017-02-26 NOTE — Progress Notes (Signed)
Daily Progress Note   Patient Name: Cassian Torelli       Date: 02/26/2017 DOB: Sep 03, 1955  Age: 61 y.o. MRN#: [id number] Attending Physician: Rosita Fire, MD Primary Care Physician: Patient, No Pcp Per Admit Date: 02/04/2017  Reason for Consultation/Follow-up: Psychosocial/spiritual support and Withdrawal of life-sustaining treatment  Subjective: Mr. Klich is minimally responsive. He will open his eyes and say a few random words. No behavioral signs of discomfort.   Length of Stay: 22  Current Medications: Scheduled Meds:  . diltiazem  90 mg Oral Q6H  . docusate  100 mg Per Tube Daily  . guaiFENesin  10 mL Per Tube Q4H  . metoprolol tartrate  50 mg Per Tube BID  . pantoprazole sodium  40 mg Per Tube Q24H    Continuous Infusions: . morphine 2 mg/hr (02/26/17 1213)    PRN Meds: acetaminophen (TYLENOL) oral liquid 160 mg/5 mL **OR** [DISCONTINUED] acetaminophen, antiseptic oral rinse, ipratropium-albuterol, LORazepam, metoprolol tartrate, morphine **AND** morphine, ondansetron (ZOFRAN) IV, polyvinyl alcohol  Physical Exam         Constitutional:  Chronically ill appearing man, minimally responsive in bed. HENT:  Head: Normocephalic and atraumatic.  Mouth/Throat: Oropharynx is clear and moist. No oropharyngeal exudate.  Cardiovascular: An irregularly irregular rhythm present. Tachycardia present.   Pulmonary/Chest:  Trach, vent.  Abdominal:  PEG  Musculoskeletal:  Non-purposeful movement of RUE/RLE. No movement on left.   Neurological:  Obtunded. Will briefly open eyes to voice but not tracking or following any commands. Will say a few garbled words but unrelated to questions or situation.   Psychiatric:  Predominantly unresponsive. Calm in bed.    Vital Signs: BP 125/87   Pulse 97   Temp 99.6 F (37.6 C)  (Core (Comment))   Resp (!) 23   Ht 5' 10"  (1.778 m)   Wt 89.6 kg (197 lb 8.5 oz)   SpO2 99%   BMI 28.34 kg/m  SpO2: SpO2: 99 % O2 Device: O2 Device: Ventilator O2 Flow Rate: O2 Flow Rate (L/min): 8 L/min  Intake/output summary:  Intake/Output Summary (Last 24 hours) at 02/26/17 1404 Last data filed at 02/26/17 0533  Gross per 24 hour  Intake          1580.75 ml  Output              850 ml  Net           730.75 ml   LBM: Last BM Date: 02/26/17 Baseline Weight: Weight: 92.6 kg (204 lb 2.3 oz) Most recent weight: Weight: 89.6 kg (197 lb 8.5 oz)  Palliative Assessment/Data: PPS 10%    Patient Active Problem List   Diagnosis Date Noted  . Goals of care, counseling/discussion   . Palliative care by specialist   . Fever   . Tracheostomy in place [facility])   . VAP (ventilator-associated pneumonia) ([place])   . Tracheostomy status ([place])   . Tracheostomy malfunction ([place])   . Acute respiratory failure with hypoxia ([place])   . Cerebral edema (HCC)   . Aspiration pneumonia of right upper lobe ([place])   . Sepsis ([place])   . On mechanically assisted ventilation ([place])   . Chronic anticoagulation   . Alcoholism (  [place])   . Smoker   . ICH (intracerebral hemorrhage) ([place]) 02/04/2017  . Unspecified atrial fibrillation ([place]) 02/02/2017  . Type 2 diabetes mellitus without complication ([place]) 80/22/3361  . Hypokalemia 02/02/2017  . COPD (chronic obstructive pulmonary disease) ([place]) 02/02/2017  . Obesity (BMI 30-39.9) 02/02/2017  . Withdrawal syndrome ([place]) 01/28/2017  . Acute metabolic encephalopathy 22/44/9753  . Hyponatremia 01/28/2017  . Accelerated hypertension 01/28/2017    Palliative Care Assessment & Plan   HPI: 61 y.o. male  with past medical history of A. Fib on Xarelto, prior stroke, COPD, DM2, alcohol abuse, and HTN. He presented to the ED obtunded, and was intubated for airway protection. He was admitted on 02/04/2017. Imaging revealed a RLL PNA and a very large right parietal  intraparenchymal hemorrhage with ventricular extension, evolving hydrocephalus, and 1.5cm midline shift. He was not able to be weaned from vent and he had both a trach and PEG placed. At this point he will open his eyes and follow simple commands and say a few words, but is predominantly unresponsive. Next of kin is his daughter, Levada Dy. Palliative consulted to assist in clarifying goals of care.   Assessment: I met with Levada Dy, her mother (the patient's ex-wife), her brother (pt's son), and a family friend on 10/27. We had a long conversation about the nature of the patient's illness and options for care moving forward. Levada Dy expressed the goal of ensuring his comfort and dignity, and felt that would best be achieved through a transition to full comfort measures. She wanted to give other family a chance to be her, with the plan of transitioning to full comfort measures today.   As per the plan I discussed with Levada Dy yesterday, I started transitioning Tony's orders this morning in preparation for an afternoon one way vent removal. Once Levada Dy and her family arrived to the floor we talked through the process of removing the vent, and I reinforced the role medications would play in ensuring his comfort as his body transitions towards death. I also explained the timeline for his death is unknown, and could be hours to days.   Recommendations/Plan:  Transition to full comfort measures, medications adjusted  PMT will continue to follow and support pt and family through this  Goals of Care and Additional Recommendations:  Limitations on Scope of Treatment: Full Comfort Care  Code Status:  DNR  Prognosis:   Hours - Days  Discharge Planning:  Anticipated Hospital Death.  Care plan was discussed with Levada Dy (pt's daughter and next of kin), care nurses, and respiratory therapist.   Thank you for allowing the Palliative Medicine Team to assist in the care of this patient.  Time in: 1400 Time  out: 1500 Total time: 75 minutes    Greater than 50%  of this time was spent counseling and coordinating care related to the above assessment and plan.  Charlynn Court, NP Palliative Medicine Team [phone] pager (7a-5p) Team Phone # [phone]

## 2017-02-26 NOTE — Progress Notes (Signed)
Nurse called as family made decision to remove some of life support.  Family has many issues going on. Patient was not friendly to many of them at various times and broke off relationships with the ones who cared most for him.  Family struggling with how to handle things such as burial. He was a retired Cytogeneticistveteran. Family concerned he had no life insurance and no income for burial support.  Least expensive option is cremation and then they can choose where to put his ashes.  Waited with family over 1 hour. If family would like support again, just request the chaplain be called back. Phebe CollaDonna S Laurice Kimmons, Chaplain   02/26/17 1500  Clinical Encounter Type  Visited With Patient and family together  Referral From Nurse  Consult/Referral To Chaplain  Spiritual Encounters  Spiritual Needs Prayer;Emotional  Stress Factors  Family Stress Factors Family relationships

## 2017-02-27 LAB — GLUCOSE, CAPILLARY: Glucose-Capillary: 123 mg/dL — ABNORMAL HIGH (ref 65–99)

## 2017-02-27 MED ORDER — GLYCOPYRROLATE 0.2 MG/ML IJ SOLN
0.2000 mg | INTRAMUSCULAR | Status: DC
  Administered 2017-02-27 – 2017-03-02 (×19): 0.2 mg via INTRAVENOUS
  Filled 2017-02-27 (×19): qty 1

## 2017-02-27 MED ORDER — SCOPOLAMINE 1 MG/3DAYS TD PT72
1.0000 | MEDICATED_PATCH | TRANSDERMAL | Status: DC
  Administered 2017-02-27 – 2017-03-02 (×2): 1.5 mg via TRANSDERMAL
  Filled 2017-02-27 (×2): qty 1

## 2017-02-27 NOTE — Progress Notes (Signed)
Daily Progress Note   Patient Name: Stuart Mendoza       Date: 02/27/2017 DOB: 10-Jul-1955  Age: 61 y.o. MRN#: [id number] Attending Physician: Maxie BarbBhandari, Dron Prasad, MD Primary Care Physician: Patient, No Pcp Per Admit Date: 02/04/2017  Reason for Consultation/Follow-up: Psychosocial/spiritual support and Withdrawal of life-sustaining treatment  Subjective: [name] is relatively unchanged from when I last saw him yesterday afternoon. He will continue to open his eyes intermittently to voice, but is not meaningfully interactive. No family at bedside.   Length of Stay: 23  Current Medications: Scheduled Meds:  . acetaminophen (TYLENOL) oral liquid 160 mg/5 mL  650 mg Per Tube Q6H  . diltiazem  90 mg Oral Q6H  . docusate  100 mg Per Tube Daily  . glycopyrrolate  0.2 mg Intravenous Q4H  . metoprolol tartrate  50 mg Per Tube BID  . pantoprazole sodium  40 mg Per Tube Q24H  . scopolamine  1 patch Transdermal Q72H    Continuous Infusions: . morphine 2 mg/hr (02/26/17 1742)    PRN Meds: antiseptic oral rinse, ipratropium-albuterol, LORazepam, morphine **AND** morphine, ondansetron (ZOFRAN) IV, polyvinyl alcohol  Physical Exam         Constitutional:  Chronically ill appearing man, minimally responsive in bed. HENT:  Head: Normocephalic and atraumatic.  Mouth/Throat: Oropharynx is clear and moist. No oropharyngeal exudate.  Cardiovascular: An irregularly irregular rhythm present. Regular rate.  Pulmonary/Chest:  Trach, on humidified trach collar Abdominal:  PEG  Musculoskeletal:  Non-purposeful movement of RUE/RLE. No movement on left.   Neurological:  Obtunded. Will briefly open eyes to voice but not tracking or following any commands. Will say a few garbled words but unrelated to questions or situation.   Psychiatric:    Predominantly unresponsive. Calm in bed.    Vital Signs: BP 128/74   Pulse 92   Temp 98.3 F (36.8 C) (Oral)   Resp (!) 24   Ht 5\' 10"  (1.778 m)   Wt 89.6 kg (197 lb 8.5 oz)   SpO2 (!) 84%   BMI 28.34 kg/m  SpO2: SpO2: (!) 84 % O2 Device: O2 Device: Tracheostomy Collar O2 Flow Rate: O2 Flow Rate (L/min): 5 L/min  Intake/output summary:   Intake/Output Summary (Last 24 hours) at 02/27/17 1216 Last data filed at 02/26/17 2320  Gross per 24 hour  Intake             7.57 ml  Output              200 ml  Net          -192.43 ml   LBM: Last BM Date: 02/26/17 Baseline Weight: Weight: 92.6 kg (204 lb 2.3 oz) Most recent weight: Weight: 89.6 kg (197 lb 8.5 oz)  Palliative Assessment/Data: PPS 10%    Patient Active Problem List   Diagnosis Date Noted  . Terminal care   . Goals of care, counseling/discussion   . Palliative care by specialist   . Fever   . Tracheostomy in place [facility](HCC)   . VAP (ventilator-associated pneumonia) (HCC)   . Tracheostomy status (HCC)   . Tracheostomy malfunction (HCC)   . Acute respiratory failure with hypoxia (HCC)   . Cerebral edema (  HCC)   . Aspiration pneumonia of right upper lobe (HCC)   . Sepsis (HCC)   . On mechanically assisted ventilation (HCC)   . Chronic anticoagulation   . Alcoholism (HCC)   . Smoker   . ICH (intracerebral hemorrhage) (HCC) 02/04/2017  . Unspecified atrial fibrillation (HCC) 02/02/2017  . Type 2 diabetes mellitus without complication (HCC) 02/02/2017  . Hypokalemia 02/02/2017  . COPD (chronic obstructive pulmonary disease) (HCC) 02/02/2017  . Obesity (BMI 30-39.9) 02/02/2017  . Withdrawal syndrome (HCC) 01/28/2017  . Acute metabolic encephalopathy 01/28/2017  . Hyponatremia 01/28/2017  . Accelerated hypertension 01/28/2017    Palliative Care Assessment & Plan   HPI: 61 y.o. male  with past medical history of A. Fib on Xarelto, prior stroke, COPD, DM2, alcohol abuse, and HTN. He presented to the ED  obtunded, and was intubated for airway protection. He was admitted on 02/04/2017. Imaging revealed a RLL PNA and a very large right parietal intraparenchymal hemorrhage with ventricular extension, evolving hydrocephalus, and 1.5cm midline shift. He was not able to be weaned from vent and he had both a trach and PEG placed. At this point he will open his eyes and follow simple commands and say a few words, but is predominantly unresponsive. Next of kin is his daughter, Stuart Mendoza. Palliative consulted to assist in clarifying goals of care.   Assessment: Pt transitioned to full comfort measures at 1400 on 10/28. He has been on a morphine drip and has remained quite comfortable. Today, I noted increased respiratory secretions with an inability to clear them and poor response to suctioning. I did contact his daughter and was able to update her and her mother over the phone. They were concerned about him starving or becoming dehydrated. I explained the dying process and the changed relationship the dying body has with food and fluid. She felt much more comfortable after our conversation  Recommendations/Plan:  Continue full comfort measures, I anticipate a hospital death with prognosis hours to a day or two  Symptoms  Continue morphine drip and PRN boluses  Secretions increased, will stop guaifenesin as he is no longer able to clear secretions. Scopolamine patch and scheduled Robinul started  Goals of Care and Additional Recommendations:  Limitations on Scope of Treatment: Full Comfort Care  Code Status:  DNR  Prognosis:   Hours - Days  Discharge Planning:  Anticipated Hospital Death.  Care plan was discussed with Stuart Mendoza (pt's daughter and next of kin), and care nurse.  Thank you for allowing the Palliative Medicine Team to assist in the care of this patient.  Total time: 35 minutes    Greater than 50%  of this time was spent counseling and coordinating care related to the above assessment  and plan.  Murrell Converse, NP Palliative Medicine Team [phone] pager (7a-5p) Team Phone # [phone]

## 2017-02-27 NOTE — Progress Notes (Signed)
Patient has been transitioned to comfort care.  Reviewed with Dr. Ronalee BeltsBhandari, PCCM will be available PRN.  Please call back if new needs arise.    Canary BrimBrandi Kaina Orengo, NP-C [facility] Pgr: [phone] or if no answer [phone] 02/27/2017, 10:08 AM

## 2017-02-27 NOTE — Progress Notes (Signed)
Nutrition Follow Up/Brief Note  Chart reviewed. Pt now transitioning to comfort care.  No further nutrition interventions warranted at this time.  Please re-consult as needed.   Katie Kerianne Gurr, RD, LDN Pager #: [phone] After-Hours Pager #: [phone]    

## 2017-02-27 NOTE — Plan of Care (Signed)
Problem: Activity: Goal: Ability to tolerate increased activity will improve Outcome: Progressing Discussing with patient plan of care for the evening with medications and comfort as the goal with no teach back displayed at this time.

## 2017-02-27 NOTE — Progress Notes (Signed)
PROGRESS NOTE    Stuart Mendoza  WGN:[id number] DOB: 1955/08/05 DOA: 02/04/2017 PCP: Patient, No Pcp Per   Brief Narrative: 61 year old male with past medical history of A. fib on Xarelto, COPD, hypertension, diabetes, alcohol abuse who was admitted on 10/6 with large right parietal ICH and IVH, and a 1.7 cm midline shift with hydrocephalus.  Also was found to be in acute hypoxic respiratory failure due to hospital-acquired pneumonia and subsequently intubated. Patient was unable to be weaned off vent, trach was placed. PEG also performed. Remained stable and has been transferred to [facility] for further care.   SIGNIFICANT EVENTS: 10/6 Admit, neurology, neurosurgery consulted, kcentra, 3% NS  10/12 remains with reasonable neurostatus; s/p Tracheostomy 10/13: Tolerated ATC All day 10/14: Went back on vent 10/14 PM due to dyspnea.  10/15: minimally responsive which sounds worse than prior baseline last week based on EMR notes. on TC trial has RR 35-40 and thick purulent secretions from Trach. 10/16: fever, pan-cultured, cuff leak so trach exchanged for #6 distal XLT; restart zosyn 10/17: increased tachycardia, thick tan secretions, tmax 98.8, failed SBT secondary to rr in 40's, per RT- multiple mucous plugs requiring bag/lavage 10/18: HR better controlled, tmax 102.1 but improving, Na improving with free water, following commands on right. Still having mucous plugging 10/19: PEG placed by Trauma surgery; tolerated 2hrs of PSV 10/20: RT says no further mucous plugs but does have an intermittent cuff leak that is positional (still getting good tidal volumes in 500's despite leak) 10/27 palliative care met with family and plan for comfort care starting from 10/28 when all family arrives. 10/28 comfort care only  Assessment & Plan:  # goals of care: Comfort care only. Palliative care following. On morphine drip. Continue supportive care.  # ICH/IVH: -Seen by neurologist, recommended MRI in 6  weeks and outpatient follow-up. Now comfort care --he opens eyes and intermittent minimal verbal  # Acute respiratory failure with hypoxia/HCAP, mucous plugging and acute pulmonary edema -Status post trach 10/12 -Sputum cultures grew Pseudomonas and staph lugdenesis, completed abx course.Zosyn for 7 days. -Patient is  on the ventilator. He had temperature of 103 and respiratory distress on 10/25 nights, started IV zosyn for possible aspiration pneumonia. Now comfort care only. No abx  # PAF, Afib with RVR:  #Hypertension:  #Diabetes type 2:  # Hypernatrema/Hypokalemia -resolved # Anemia of chronic disease and critical illness # AKI due to Afib w RVR/ACEI/diuretics:  # diarrhea due to tube feed: c diff negative/.  DVT prophylaxis:CC Code Status:DNR Family Communication:no family at bedside Disposition Plan:comfort    Consultants:   PCCM  Neurology   Palliative care  Procedures: ETT 10/06 >> 10/12 Trach #6 Shiley (DF) 10/12 >> 10/16 Trach #6 Distal XLT 10/16 >> LSC TLC 10/6 >> 10/15  Antimicrobials: Vancomycin 10/6 >>10/8 Zosyn 10/6 >> 10/7; 10/16 >> CTX 10/8>>> 10/14  Subjective: Patient was seen and examined at bedside. Off vent, on morphine drip. Opened eyes, non verbal. Looks comfortable. Objective: Vitals:   02/27/17 0340 02/27/17 0449 02/27/17 0738 02/27/17 1102  BP: 128/74     Pulse: 96 95 91 92  Resp: 13 13 (!) 22 (!) 24  Temp: 98.3 F (36.8 C)     TempSrc: Oral     SpO2: 97% 95% 97% (!) 84%  Weight:      Height:        Intake/Output Summary (Last 24 hours) at 02/27/17 1203 Last data filed at 02/26/17 2320  Gross per 24 hour  Intake  21.65 ml  Output              200 ml  Net          -178.35 ml   Filed Weights   02/24/17 0242 02/25/17 0249 02/26/17 0331  Weight: 87.4 kg (192 lb 10.9 oz) 90.5 kg (199 lb 8.3 oz) 89.6 kg (197 lb 8.5 oz)    Examination:  General exam: ill-looking ale lying on bed, open eyes, looks comfortable,  off ventilator Central nervous system: Opens eyes only, nonverbal Skin: No rashes, lesions or ulcers Psychiatry: unable to assess     Data Reviewed: I have personally reviewed following labs and imaging studies  CBC:  Recent Labs Lab 02/21/17 0503 02/22/17 0318 02/23/17 0654 02/24/17 0258 02/25/17 0539  WBC 14.3* 15.1* 14.2* 25.0* 20.9*  NEUTROABS 12.0* 12.4* 11.5* 21.7*  --   HGB 10.4* 10.8* 11.5* 11.4* 10.9*  HCT 33.9* 34.8* 37.6* 37.5* 36.6*  MCV 87.4 87.9 89.7 91.2 92.0  PLT 553* 592* 527* 546* 468   Basic Metabolic Panel:  Recent Labs Lab 02/21/17 0503 02/22/17 0318 02/24/17 0258 02/24/17 1921 02/25/17 0539  NA 139 141 147*  --  146*  K 3.5 4.2 4.8  --  4.4  CL 102 103 110  --  108  CO2 _0 --  26  GLUCOSE 144* 123* 197*  --  173*  BUN 25* 31* 50*  --  46*  CREATININE 0.86 0.96 1.38*  --  1.00  CALCIUM 9.6 9.9 10.3  --  9.9  MG 2.1 2.2  --  2.5*  --   PHOS 3.9 4.0  --   --   --    GFR: Estimated Creatinine Clearance: 86.2 mL/min (by C-G formula based on SCr of 1 mg/dL). Liver Function Tests:  Recent Labs Lab 02/21/17 0503  AST 36  ALT 52  ALKPHOS 255*  BILITOT 0.8  PROT 6.6  ALBUMIN 2.6*   No results for input(s): LIPASE, AMYLASE in the last 168 hours. No results for input(s): AMMONIA in the last 168 hours. Coagulation Profile: No results for input(s): INR, PROTIME in the last 168 hours. Cardiac Enzymes: No results for input(s): CKTOTAL, CKMB, CKMBINDEX, TROPONINI in the last 168 hours. BNP (last 3 results) No results for input(s): PROBNP in the last 8760 hours. HbA1C: No results for input(s): HGBA1C in the last 72 hours. CBG:  Recent Labs Lab 02/26/17 0333 02/26/17 0817 02/26/17 2039 02/26/17 2316 02/27/17 0338  GLUCAP 89 163* 147* 135* 123*   Lipid Profile: No results for input(s): CHOL, HDL, LDLCALC, TRIG, CHOLHDL, LDLDIRECT in the last 72 hours. Thyroid Function Tests: No results for input(s): TSH, T4TOTAL, FREET4,  T3FREE, THYROIDAB in the last 72 hours. Anemia Panel: No results for input(s): VITAMINB12, FOLATE, FERRITIN, TIBC, IRON, RETICCTPCT in the last 72 hours. Sepsis Labs:  Recent Labs Lab 02/24/17 0258 02/24/17 0528 02/24/17 0538  PROCALCITON  --  0.19  --   LATICACIDVEN 1.9  --  2.2*    Recent Results (from the past 240 hour(s))  C difficile quick scan w PCR reflex     Status: None   Collection Time: 02/24/17  4:45 AM  Result Value Ref Range Status   C Diff antigen NEGATIVE NEGATIVE Final   C Diff toxin NEGATIVE NEGATIVE Final   C Diff interpretation No C. difficile detected.  Final  Culture, blood (routine x 2)     Status: None (Preliminary result)   Collection Time: 02/24/17  5:28  AM  Result Value Ref Range Status   Specimen Description BLOOD RIGHT HAND  Final   Special Requests   Final    BOTTLES DRAWN AEROBIC ONLY Blood Culture adequate volume   Culture NO GROWTH 2 DAYS  Final   Report Status PENDING  Incomplete  Culture, blood (routine x 2)     Status: None (Preliminary result)   Collection Time: 02/24/17  5:38 AM  Result Value Ref Range Status   Specimen Description BLOOD LEFT HAND  Final   Special Requests   Final    BOTTLES DRAWN AEROBIC ONLY Blood Culture adequate volume   Culture NO GROWTH 2 DAYS  Final   Report Status PENDING  Incomplete  Culture, respiratory (NON-Expectorated)     Status: None   Collection Time: 02/24/17  8:10 AM  Result Value Ref Range Status   Specimen Description TRACHEAL ASPIRATE  Final   Special Requests NONE  Final   Gram Stain   Final    FEW WBC PRESENT, PREDOMINANTLY PMN FEW SQUAMOUS EPITHELIAL CELLS PRESENT MODERATE GRAM NEGATIVE RODS MODERATE GRAM POSITIVE RODS MODERATE GRAM POSITIVE COCCI    Culture MODERATE PSEUDOMONAS AERUGINOSA  Final   Report Status 02/26/2017 FINAL  Final   Organism ID, Bacteria PSEUDOMONAS AERUGINOSA  Final      Susceptibility   Pseudomonas aeruginosa - MIC*    CEFTAZIDIME 4 SENSITIVE Sensitive      CIPROFLOXACIN 0.5 SENSITIVE Sensitive     GENTAMICIN <=1 SENSITIVE Sensitive     IMIPENEM 1 SENSITIVE Sensitive     PIP/TAZO 32 SENSITIVE Sensitive     CEFEPIME 8 SENSITIVE Sensitive     * MODERATE PSEUDOMONAS AERUGINOSA  MRSA PCR Screening     Status: None   Collection Time: 02/24/17 10:05 AM  Result Value Ref Range Status   MRSA by PCR NEGATIVE NEGATIVE Final    Comment:        The GeneXpert MRSA Assay (FDA approved for NASAL specimens only), is one component of a comprehensive MRSA colonization surveillance program. It is not intended to diagnose MRSA infection nor to guide or monitor treatment for MRSA infections.          Radiology Studies: No results found.      Scheduled Meds: . acetaminophen (TYLENOL) oral liquid 160 mg/5 mL  650 mg Per Tube Q6H  . diltiazem  90 mg Oral Q6H  . docusate  100 mg Per Tube Daily  . glycopyrrolate  0.2 mg Intravenous Q4H  . metoprolol tartrate  50 mg Per Tube BID  . pantoprazole sodium  40 mg Per Tube Q24H  . scopolamine  1 patch Transdermal Q72H   Continuous Infusions: . morphine 2 mg/hr (02/26/17 1742)     LOS: 23 days    Lacorey Brusca Tanna Furry, MD [facility] Pager [phone]  If 7PM-7AM, please contact night-coverage [email or web address] Password [email or web address] 02/27/2017, 12:03 PM

## 2017-02-28 MED ORDER — KETOROLAC TROMETHAMINE 30 MG/ML IJ SOLN
30.0000 mg | Freq: Three times a day (TID) | INTRAMUSCULAR | Status: DC | PRN
  Administered 2017-02-28: 30 mg via INTRAVENOUS
  Filled 2017-02-28: qty 1

## 2017-02-28 NOTE — Progress Notes (Signed)
Spoke w/ pts brother Gabriel RungJoe on phone.  Joe requesting updates. Relayed that I left vm for pts dtr Angela this AM; pt has had a decline in respiratory status, goal is to keep pt comfortable on morphine drip.  Pts brother appreciative.

## 2017-02-28 NOTE — Progress Notes (Signed)
Spoke with palliative care NP regarding trach change. NP instructed RT to hold off on change since patient is now comfort care.

## 2017-02-28 NOTE — Progress Notes (Signed)
PROGRESS NOTE    Stuart Mendoza  DJT:[id number] DOB: March 02, 1956 DOA: 02/04/2017 PCP: Patient, No Pcp Per   Brief Narrative: 61 year old male with past medical history of A. fib on Xarelto, COPD, hypertension, diabetes, alcohol abuse who was admitted on 10/6 with large right parietal ICH and IVH, and a 1.7 cm midline shift with hydrocephalus.  Also was found to be in acute hypoxic respiratory failure due to hospital-acquired pneumonia and subsequently intubated. Patient was unable to be weaned off vent, trach was placed. PEG also performed. Remained stable and has been transferred to [facility] for further care.   SIGNIFICANT EVENTS: 10/6 Admit, neurology, neurosurgery consulted, kcentra, 3% NS  10/12 remains with reasonable neurostatus; s/p Tracheostomy 10/13: Tolerated ATC All day 10/14: Went back on vent 10/14 PM due to dyspnea.  10/15: minimally responsive which sounds worse than prior baseline last week based on EMR notes. on TC trial has RR 35-40 and thick purulent secretions from Trach. 10/16: fever, pan-cultured, cuff leak so trach exchanged for #6 distal XLT; restart zosyn 10/17: increased tachycardia, thick tan secretions, tmax 98.8, failed SBT secondary to rr in 40's, per RT- multiple mucous plugs requiring bag/lavage 10/18: HR better controlled, tmax 102.1 but improving, Na improving with free water, following commands on right. Still having mucous plugging 10/19: PEG placed by Trauma surgery; tolerated 2hrs of PSV 10/20: RT says no further mucous plugs but does have an intermittent cuff leak that is positional (still getting good tidal volumes in 500's despite leak) 10/27 palliative care met with family and plan for comfort care starting from 10/28 when all family arrives. 10/28 comfort care only  Assessment & Plan:  # goals of care: Comfort care only. Palliative care following. On morphine drip and supportive care. -palliative care consult appreciated. Pt was in mild  respiratory distress this am and having fever. On tylenol.  # ICH/IVH: -Seen by neurologist, recommended MRI in 6 weeks and outpatient follow-up. Now comfort care --unresponsiveness  # Acute respiratory failure with hypoxia/HCAP, mucous plugging and acute pulmonary edema -Status post trach 10/12 -Sputum cultures grew Pseudomonas and staph lugdenesis, completed abx course.Zosyn for 7 days. -Patient is  on the ventilator. He had temperature of 103 and respiratory distress on 10/25 nights, started IV zosyn for possible aspiration pneumonia. Now comfort care only. No abx  # PAF, Afib with RVR:  #Hypertension:  #Diabetes type 2:  # Hypernatrema/Hypokalemia -resolved # Anemia of chronic disease and critical illness # AKI due to Afib w RVR/ACEI/diuretics:  # diarrhea due to tube feed: c diff negative/.  DVT prophylaxis:CC Code Status:DNR Family Communication:no family at bedside Disposition Plan:comfort    Consultants:   PCCM  Neurology   Palliative care  Procedures: ETT 10/06 >> 10/12 Trach #6 Shiley (DF) 10/12 >> 10/16 Trach #6 Distal XLT 10/16 >> LSC TLC 10/6 >> 10/15  Antimicrobials: Vancomycin 10/6 >>10/8 Zosyn 10/6 >> 10/7; 10/16 >> CTX 10/8>>> 10/14  Subjective: Patient was seen and examined at bedside. Off vent, mild resp distress but not responsive..  Objective: Vitals:   02/28/17 1000 02/28/17 1100 02/28/17 1123 02/28/17 1130  BP:      Pulse: 90 (!) 102 (!) 108   Resp: 18 11 11    Temp:    (!) 102.9 F (39.4 C)  TempSrc:    Oral  SpO2: (!) 89% 92% (!) 89%   Weight:      Height:        Intake/Output Summary (Last 24 hours) at 02/28/17 1211 Last data filed  at 02/28/17 0830  Gross per 24 hour  Intake              526 ml  Output              100 ml  Net              426 ml   Filed Weights   02/24/17 0242 02/25/17 0249 02/26/17 0331  Weight: 87.4 kg (192 lb 10.9 oz) 90.5 kg (199 lb 8.3 oz) 89.6 kg (197 lb 8.5 oz)    Examination:  General  exam: ill-looking male, unresponsive Central nervous system: not opening eyes  today Skin: No rashes, lesions or ulcers Psychiatry: unable to assess     Data Reviewed: I have personally reviewed following labs and imaging studies  CBC:  Recent Labs Lab 02/22/17 0318 02/23/17 0654 02/24/17 0258 02/25/17 0539  WBC 15.1* 14.2* 25.0* 20.9*  NEUTROABS 12.4* 11.5* 21.7*  --   HGB 10.8* 11.5* 11.4* 10.9*  HCT 34.8* 37.6* 37.5* 36.6*  MCV 87.9 89.7 91.2 92.0  PLT 592* 527* 546* 623   Basic Metabolic Panel:  Recent Labs Lab 02/22/17 0318 02/24/17 0258 02/24/17 1921 02/25/17 0539  NA 141 147*  --  146*  K 4.2 4.8  --  4.4  CL 103 110  --  108  CO2 26 26  --  26  GLUCOSE 123* 197*  --  173*  BUN 31* 50*  --  46*  CREATININE 0.96 1.38*  --  1.00  CALCIUM 9.9 10.3  --  9.9  MG 2.2  --  2.5*  --   PHOS 4.0  --   --   --    GFR: Estimated Creatinine Clearance: 86.2 mL/min (by C-G formula based on SCr of 1 mg/dL). Liver Function Tests: No results for input(s): AST, ALT, ALKPHOS, BILITOT, PROT, ALBUMIN in the last 168 hours. No results for input(s): LIPASE, AMYLASE in the last 168 hours. No results for input(s): AMMONIA in the last 168 hours. Coagulation Profile: No results for input(s): INR, PROTIME in the last 168 hours. Cardiac Enzymes: No results for input(s): CKTOTAL, CKMB, CKMBINDEX, TROPONINI in the last 168 hours. BNP (last 3 results) No results for input(s): PROBNP in the last 8760 hours. HbA1C: No results for input(s): HGBA1C in the last 72 hours. CBG:  Recent Labs Lab 02/26/17 0333 02/26/17 0817 02/26/17 2039 02/26/17 2316 02/27/17 0338  GLUCAP 89 163* 147* 135* 123*   Lipid Profile: No results for input(s): CHOL, HDL, LDLCALC, TRIG, CHOLHDL, LDLDIRECT in the last 72 hours. Thyroid Function Tests: No results for input(s): TSH, T4TOTAL, FREET4, T3FREE, THYROIDAB in the last 72 hours. Anemia Panel: No results for input(s): VITAMINB12, FOLATE, FERRITIN,  TIBC, IRON, RETICCTPCT in the last 72 hours. Sepsis Labs:  Recent Labs Lab 02/24/17 0258 02/24/17 0528 02/24/17 0538  PROCALCITON  --  0.19  --   LATICACIDVEN 1.9  --  2.2*    Recent Results (from the past 240 hour(s))  C difficile quick scan w PCR reflex     Status: None   Collection Time: 02/24/17  4:45 AM  Result Value Ref Range Status   C Diff antigen NEGATIVE NEGATIVE Final   C Diff toxin NEGATIVE NEGATIVE Final   C Diff interpretation No C. difficile detected.  Final  Culture, blood (routine x 2)     Status: None (Preliminary result)   Collection Time: 02/24/17  5:28 AM  Result Value Ref Range Status   Specimen Description  BLOOD RIGHT HAND  Final   Special Requests   Final    BOTTLES DRAWN AEROBIC ONLY Blood Culture adequate volume   Culture NO GROWTH 3 DAYS  Final   Report Status PENDING  Incomplete  Culture, blood (routine x 2)     Status: None (Preliminary result)   Collection Time: 02/24/17  5:38 AM  Result Value Ref Range Status   Specimen Description BLOOD LEFT HAND  Final   Special Requests   Final    BOTTLES DRAWN AEROBIC ONLY Blood Culture adequate volume   Culture NO GROWTH 3 DAYS  Final   Report Status PENDING  Incomplete  Culture, respiratory (NON-Expectorated)     Status: None   Collection Time: 02/24/17  8:10 AM  Result Value Ref Range Status   Specimen Description TRACHEAL ASPIRATE  Final   Special Requests NONE  Final   Gram Stain   Final    FEW WBC PRESENT, PREDOMINANTLY PMN FEW SQUAMOUS EPITHELIAL CELLS PRESENT MODERATE GRAM NEGATIVE RODS MODERATE GRAM POSITIVE RODS MODERATE GRAM POSITIVE COCCI    Culture MODERATE PSEUDOMONAS AERUGINOSA  Final   Report Status 02/26/2017 FINAL  Final   Organism ID, Bacteria PSEUDOMONAS AERUGINOSA  Final      Susceptibility   Pseudomonas aeruginosa - MIC*    CEFTAZIDIME 4 SENSITIVE Sensitive     CIPROFLOXACIN 0.5 SENSITIVE Sensitive     GENTAMICIN <=1 SENSITIVE Sensitive     IMIPENEM 1 SENSITIVE Sensitive       PIP/TAZO 32 SENSITIVE Sensitive     CEFEPIME 8 SENSITIVE Sensitive     * MODERATE PSEUDOMONAS AERUGINOSA  MRSA PCR Screening     Status: None   Collection Time: 02/24/17 10:05 AM  Result Value Ref Range Status   MRSA by PCR NEGATIVE NEGATIVE Final    Comment:        The GeneXpert MRSA Assay (FDA approved for NASAL specimens only), is one component of a comprehensive MRSA colonization surveillance program. It is not intended to diagnose MRSA infection nor to guide or monitor treatment for MRSA infections.          Radiology Studies: No results found.      Scheduled Meds: . acetaminophen (TYLENOL) oral liquid 160 mg/5 mL  650 mg Per Tube Q6H  . diltiazem  90 mg Oral Q6H  . docusate  100 mg Per Tube Daily  . glycopyrrolate  0.2 mg Intravenous Q4H  . metoprolol tartrate  50 mg Per Tube BID  . pantoprazole sodium  40 mg Per Tube Q24H  . scopolamine  1 patch Transdermal Q72H   Continuous Infusions: . morphine 2 mg/hr (02/26/17 1742)     LOS: 24 days    Lexys Milliner Tanna Furry, MD [facility] Pager [phone]  If 7PM-7AM, please contact night-coverage [email or web address] Password [email or web address] 02/28/2017, 12:11 PM

## 2017-02-28 NOTE — Progress Notes (Signed)
Respiratory Care Note Pt seen by trach team for consult. Pt on 28% humidified trach collar at this time.  No education needed.  All necessary equipment is at bedside.  Will continue to follow for progression.

## 2017-02-28 NOTE — Progress Notes (Signed)
Daily Progress Note   Patient Name: Stuart Mendoza       Date: 02/28/2017 DOB: 09-22-1955  Age: 61 y.o. MRN#: [id number] Attending Physician: Maxie Barb, MD Primary Care Physician: Patient, No Pcp Per Admit Date: 02/04/2017  Reason for Consultation/Follow-up: Psychosocial/spiritual support and Withdrawal of life-sustaining treatment  Subjective: Stuart Mendoza is in the process of dying. His breathing is no longer rhythmic and he is now having periods of apnea. He is also more somnolent and is no longer opening his eyes to voice and now minimally moving in bed. No family at bedside.   Length of Stay: 24  Current Medications: Scheduled Meds:  . acetaminophen (TYLENOL) oral liquid 160 mg/5 mL  650 mg Per Tube Q6H  . diltiazem  90 mg Oral Q6H  . docusate  100 mg Per Tube Daily  . glycopyrrolate  0.2 mg Intravenous Q4H  . metoprolol tartrate  50 mg Per Tube BID  . pantoprazole sodium  40 mg Per Tube Q24H  . scopolamine  1 patch Transdermal Q72H    Continuous Infusions: . morphine 2 mg/hr (02/26/17 1742)    PRN Meds: antiseptic oral rinse, LORazepam, morphine **AND** morphine, ondansetron (ZOFRAN) IV, polyvinyl alcohol  Physical Exam         Constitutional:  Chronically ill appearing man, unresponsive in bed HENT:  Head: Normocephalic and atraumatic.  Mouth/Throat: Oropharynx is clear and moist. No oropharyngeal exudate.  Cardiovascular: An irregularly irregular rhythm present.  Tachycardia Pulmonary/Chest:  Trach, on humidified room air via trach collar Abdominal:  PEG  Musculoskeletal:  No longer moving in bed. Neurological:  Unresponsive and no longer opening his eyes to voice or speaking any words.   Vital Signs: BP 93/80   Pulse (!) 131   Temp (!) 100.7 F (38.2 C) (Axillary)   Resp 12   Ht 5\' 10"   (1.778 m)   Wt 89.6 kg (197 lb 8.5 oz)   SpO2 91%   BMI 28.34 kg/m  SpO2: SpO2: 91 % O2 Device: O2 Device: Tracheostomy Collar O2 Flow Rate: O2 Flow Rate (L/min): 10 L/min; clarfied O2 flow rate with nurse and confirmed humidified room air, not supplemental O2.  Intake/output summary:   Intake/Output Summary (Last 24 hours) at 02/28/17 0856 Last data filed at 02/28/17 0830  Gross per 24 hour  Intake              526 ml  Output              100 ml  Net              426 ml   LBM: Last BM Date: 02/27/17 Baseline Weight: Weight: 92.6 kg (204 lb 2.3 oz) Most recent weight: Weight: 89.6 kg (197 lb 8.5 oz)  Palliative Assessment/Data: PPS 10%    Patient Active Problem List   Diagnosis Date Noted  . Terminal care   . Goals of care, counseling/discussion   . Palliative care by specialist   . Fever   . Tracheostomy in place [facility])   . VAP (ventilator-associated pneumonia) (HCC)   . Tracheostomy status (HCC)   . Tracheostomy malfunction (HCC)   . Acute respiratory failure with hypoxia (HCC)   .  Cerebral edema (HCC)   . Aspiration pneumonia of right upper lobe (HCC)   . Sepsis (HCC)   . On mechanically assisted ventilation (HCC)   . Chronic anticoagulation   . Alcoholism (HCC)   . Smoker   . ICH (intracerebral hemorrhage) (HCC) 02/04/2017  . Unspecified atrial fibrillation (HCC) 02/02/2017  . Type 2 diabetes mellitus without complication (HCC) 02/02/2017  . Hypokalemia 02/02/2017  . COPD (chronic obstructive pulmonary disease) (HCC) 02/02/2017  . Obesity (BMI 30-39.9) 02/02/2017  . Withdrawal syndrome (HCC) 01/28/2017  . Acute metabolic encephalopathy 01/28/2017  . Hyponatremia 01/28/2017  . Accelerated hypertension 01/28/2017    Palliative Care Assessment & Plan   HPI: 61 y.o. male  with past medical history of A. Fib on Xarelto, prior stroke, COPD, DM2, alcohol abuse, and HTN. He presented to the ED obtunded, and was intubated for airway protection. He was admitted on  02/04/2017. Imaging revealed a RLL PNA and a very large right parietal intraparenchymal hemorrhage with ventricular extension, evolving hydrocephalus, and 1.5cm midline shift. He was not able to be weaned from vent and he had both a trach and PEG placed. At this point he will open his eyes and follow simple commands and say a few words, but is predominantly unresponsive. Next of kin is his daughter, Stuart Landngela. Palliative consulted to assist in clarifying goals of care.   Assessment: Pt transitioned to full comfort measures at 1400 on 10/28. He has been on a morphine drip. He started having increased respiratory distress yesterday due to increased secretions, this was well managed by starting scheduled Robinul and a Scopolamine patch. Today, he is now unresponsive. I had a good discussion with his care nurse surrounding comfort medications and orders.   Recommendations/Plan:  Continue full comfort measures, I anticipate a hospital death with prognosis hours to days  Symptoms  Continue morphine drip and PRN boluses  Continue scheduled Robinul and Scopolamine patch  Ongoing fevers, will continue scheduled Tylenol and add PRN Toradol  Cardizem and Lopressor continue as previously pt very symptomatic from tachycardia, consequently, I view these as comfort medications   Goals of Care and Additional Recommendations:  Limitations on Scope of Treatment: Full Comfort Care  Code Status:  DNR  Prognosis:   Hours - Days  Discharge Planning:  Anticipated Hospital Death.  Care plan was discussed with care nurse and primary team  Thank you for allowing the Palliative Medicine Team to assist in the care of this patient.  Total time: 35 minutes    Greater than 50%  of this time was spent counseling and coordinating care related to the above assessment and plan.  Murrell ConverseSarah Ether Goebel, NP Palliative Medicine Team [phone] pager (7a-5p) Team Phone # [phone]

## 2017-02-28 NOTE — Progress Notes (Signed)
Left vm for pts dtr Stuart LandAngela to update on pt. Relayed that respiratory status has declined overnight and that morphine drip continued  for comfort.  Provided my phone number for any questions or concerns.

## 2017-02-28 NOTE — Care Management Important Message (Signed)
Important Message  Patient Details  Name: Delora FuelCharles Keys MRN: [id number] Date of Birth: 1955/12/21   Medicare Important Message Given:  Yes    Jerrianne Hartin Abena 02/28/2017, 9:20 AM

## 2017-03-01 LAB — CULTURE, BLOOD (ROUTINE X 2)
Culture: NO GROWTH
Culture: NO GROWTH
SPECIAL REQUESTS: ADEQUATE
SPECIAL REQUESTS: ADEQUATE

## 2017-03-01 NOTE — Progress Notes (Signed)
PROGRESS NOTE [facility]   Harrell Niehoff   JGG:[id number] DOB: Aug 15, 1955  DOA: 02/04/2017 PCP: Patient, No Pcp Per   Brief Narrative:  Stuart Mendoza is a 61 year old male with past medical history of A. fib on Xarelto, COPD, hypertension, diabetes, alcohol abuse who was admitted on 10/6 with large right parietal ICH and IVH, and a 1.7 cm midline shift with hydrocephalus. Also was found to be in acute hypoxic respiratory failure due to hospital-acquired pneumonia and subsequently intubated. Patient was unable to be weaned off vent, trach was placed. PEG also performed. Remained stable and has been [facility] care  SIGNIFICANT EVENTS: 10/6 Admit, neurology, neurosurgery consulted, kcentra, 3% NS  10/12 remains with reasonable neurostatus; s/p Tracheostomy 10/13: Tolerated ATC All day 10/14: Went back on vent 10/14 PM due to dyspnea.  10/15: minimally responsive which sounds worse than prior baseline last week based on EMR notes. on TC trial has RR 35-40 and thick purulent secretions from Trach. 10/16: fever, pan-cultured, cuff leak so trach exchanged for #6 distal XLT; restart zosyn 10/17: increased tachycardia, thick tan secretions, tmax 98.8, failed SBT secondary to rr in 40's, per RT- multiple mucous plugs requiring bag/lavage 10/18: HR better controlled, tmax 102.1 but improving, Na improving with free water, following commands on right. Still having mucous plugging 10/19: PEG placed by Trauma surgery; tolerated 2hrs of PSV 10/20: RT says no further mucous plugs but does have an intermittent cuff leak that is positional (still getting good tidal volumes in 500's despite leak) 10/27 palliative care met with family and plan for comfort care starting from 10/28 when all family arrives. 10/28 comfort care only  Subjective: Patient comfortable, RR around 18 no acute events   Assessment & Plan: ICH/IHV  Not an active issue as patient is comfort care  only   Acute respiratory failure with hypoxia, VAP, mucous plugging and pulmonary edema  S/p trach 10/12 Pseudomonas and staph lugdenesis, completed abx course with Zosyn for 7 days  On morphine ggt - comfort care only   PAF, A. fib with RVR Hypertension Type 2 diabetes mellitus Hyponatremia/hypokalemia resolved Anemia of chronic disease and critical illness AK I due to dehydration/ace inhibitors/diuresis Diarrhea due to tube feedings - negative c diff   DVT prophylaxis: CMO Code Status: DNR  Family Communication: None at bedside  Disposition Plan: comfort vs hospice facility   Consultants:   PCCM   Neurology   Palliative care   Procedures:  ETT 10/06 >> 10/12 Trach #6 Shiley (DF) 10/12 >> 10/16 Trach #6 Distal XLT 10/16 >> LSC TLC 10/6 >> 10/15  Antimicrobials: Anti-infectives    Start     Dose/Rate Route Frequency Ordered Stop   02/24/17 1400  piperacillin-tazobactam (ZOSYN) IVPB 3.375 g  Status:  Discontinued     3.375 g 12.5 mL/hr over 240 Minutes Intravenous Every 8 hours 02/24/17 1258 02/26/17 0934   02/24/17 0600  metroNIDAZOLE (FLAGYL) IVPB 500 mg  Status:  Discontinued     500 mg 100 mL/hr over 60 Minutes Intravenous Every 8 hours 02/24/17 0454 02/24/17 1256   02/24/17 0600  levofloxacin (LEVAQUIN) IVPB 500 mg  Status:  Discontinued     500 mg 100 mL/hr over 60 Minutes Intravenous Daily 02/24/17 0454 02/24/17 1256   02/24/17 0600  vancomycin (VANCOCIN) 500 mg in sodium chloride 0.9 % 100 mL IVPB  Status:  Discontinued     500 mg 100 mL/hr over 60 Minutes Intravenous Every 8 hours 02/24/17 0454 02/24/17 1256  02/14/17 0900  piperacillin-tazobactam (ZOSYN) IVPB 3.375 g     3.375 g 12.5 mL/hr over 240 Minutes Intravenous Every 8 hours 02/14/17 0830 02/21/17 2359   02/06/17 1000  cefTRIAXone (ROCEPHIN) 1 g in dextrose 5 % 50 mL IVPB     1 g 100 mL/hr over 30 Minutes Intravenous Every 24 hours 02/06/17 0924 02/12/17 0946   02/05/17 0200  vancomycin  (VANCOCIN) IVPB 1000 mg/200 mL premix  Status:  Discontinued     1,000 mg 200 mL/hr over 60 Minutes Intravenous Every 12 hours 02/04/17 1308 02/05/17 1135   02/04/17 2000  piperacillin-tazobactam (ZOSYN) IVPB 3.375 g  Status:  Discontinued     3.375 g 12.5 mL/hr over 240 Minutes Intravenous Every 8 hours 02/04/17 1310 02/06/17 0924   02/04/17 1400  piperacillin-tazobactam (ZOSYN) IVPB 3.375 g  Status:  Discontinued     3.375 g 12.5 mL/hr over 240 Minutes Intravenous Every 8 hours 02/04/17 1221 02/04/17 1310   02/04/17 1015  vancomycin (VANCOCIN) 2,000 mg in sodium chloride 0.9 % 500 mL IVPB     2,000 mg 250 mL/hr over 120 Minutes Intravenous  Once 02/04/17 1009 02/04/17 1227   02/04/17 1000  piperacillin-tazobactam (ZOSYN) IVPB 3.375 g     3.375 g 100 mL/hr over 30 Minutes Intravenous  Once 02/04/17 0957 02/04/17 1055   02/04/17 1000  vancomycin (VANCOCIN) IVPB 1000 mg/200 mL premix  Status:  Discontinued     1,000 mg 200 mL/hr over 60 Minutes Intravenous  Once 02/04/17 0957 02/04/17 1009          Objective: Vitals:   02/28/17 1924 02/28/17 2319 02/28/17 2321 03/01/17 0331  BP:  (!) 86/70 (!) 86/70   Pulse: 97 100 (!) 108 100  Resp: (!) 28 (!) 23 20 (!) 21  Temp:      TempSrc:      SpO2: 90% (!) 82% (!) 88% (!) 88%  Weight:      Height:        Intake/Output Summary (Last 24 hours) at 03/01/17 0838 Last data filed at 02/28/17 2343  Gross per 24 hour  Intake                0 ml  Output              150 ml  Net             -150 ml   Filed Weights   02/24/17 0242 02/25/17 0249 02/26/17 0331  Weight: 87.4 kg (192 lb 10.9 oz) 90.5 kg (199 lb 8.3 oz) 89.6 kg (197 lb 8.5 oz)    Examination:  General: NAD, non responsive  Cardiovascular: RRR, S1/S2 +, no rubs, no gallops Respiratory: CTA bilaterally, no wheezing, no rhonchi Abdominal: Soft, NT, ND, bowel sounds + Extremities: no edema   Data Reviewed: I have personally reviewed following labs and imaging  studies  CBC:  Recent Labs Lab 02/23/17 0654 02/24/17 0258 02/25/17 0539  WBC 14.2* 25.0* 20.9*  NEUTROABS 11.5* 21.7*  --   HGB 11.5* 11.4* 10.9*  HCT 37.6* 37.5* 36.6*  MCV 89.7 91.2 92.0  PLT 527* 546* 386   Basic Metabolic Panel:  Recent Labs Lab 02/24/17 0258 02/24/17 1921 02/25/17 0539  NA 147*  --  146*  K 4.8  --  4.4  CL 110  --  108  CO2 26  --  26  GLUCOSE 197*  --  173*  BUN 50*  --  46*  CREATININE 1.38*  --  1.00  CALCIUM 10.3  --  9.9  MG  --  2.5*  --    GFR: Estimated Creatinine Clearance: 86.2 mL/min (by C-G formula based on SCr of 1 mg/dL). Liver Function Tests: No results for input(s): AST, ALT, ALKPHOS, BILITOT, PROT, ALBUMIN in the last 168 hours. No results for input(s): LIPASE, AMYLASE in the last 168 hours. No results for input(s): AMMONIA in the last 168 hours. Coagulation Profile: No results for input(s): INR, PROTIME in the last 168 hours. Cardiac Enzymes: No results for input(s): CKTOTAL, CKMB, CKMBINDEX, TROPONINI in the last 168 hours. BNP (last 3 results) No results for input(s): PROBNP in the last 8760 hours. HbA1C: No results for input(s): HGBA1C in the last 72 hours. CBG:  Recent Labs Lab 02/26/17 0333 02/26/17 0817 02/26/17 2039 02/26/17 2316 02/27/17 0338  GLUCAP 89 163* 147* 135* 123*   Lipid Profile: No results for input(s): CHOL, HDL, LDLCALC, TRIG, CHOLHDL, LDLDIRECT in the last 72 hours. Thyroid Function Tests: No results for input(s): TSH, T4TOTAL, FREET4, T3FREE, THYROIDAB in the last 72 hours. Anemia Panel: No results for input(s): VITAMINB12, FOLATE, FERRITIN, TIBC, IRON, RETICCTPCT in the last 72 hours. Sepsis Labs:  Recent Labs Lab 02/24/17 0258 02/24/17 0528 02/24/17 0538  PROCALCITON  --  0.19  --   LATICACIDVEN 1.9  --  2.2*    Recent Results (from the past 240 hour(s))  C difficile quick scan w PCR reflex     Status: None   Collection Time: 02/24/17  4:45 AM  Result Value Ref Range Status    C Diff antigen NEGATIVE NEGATIVE Final   C Diff toxin NEGATIVE NEGATIVE Final   C Diff interpretation No C. difficile detected.  Final  Culture, blood (routine x 2)     Status: None (Preliminary result)   Collection Time: 02/24/17  5:28 AM  Result Value Ref Range Status   Specimen Description BLOOD RIGHT HAND  Final   Special Requests   Final    BOTTLES DRAWN AEROBIC ONLY Blood Culture adequate volume   Culture NO GROWTH 4 DAYS  Final   Report Status PENDING  Incomplete  Culture, blood (routine x 2)     Status: None (Preliminary result)   Collection Time: 02/24/17  5:38 AM  Result Value Ref Range Status   Specimen Description BLOOD LEFT HAND  Final   Special Requests   Final    BOTTLES DRAWN AEROBIC ONLY Blood Culture adequate volume   Culture NO GROWTH 4 DAYS  Final   Report Status PENDING  Incomplete  Culture, respiratory (NON-Expectorated)     Status: None   Collection Time: 02/24/17  8:10 AM  Result Value Ref Range Status   Specimen Description TRACHEAL ASPIRATE  Final   Special Requests NONE  Final   Gram Stain   Final    FEW WBC PRESENT, PREDOMINANTLY PMN FEW SQUAMOUS EPITHELIAL CELLS PRESENT MODERATE GRAM NEGATIVE RODS MODERATE GRAM POSITIVE RODS MODERATE GRAM POSITIVE COCCI    Culture MODERATE PSEUDOMONAS AERUGINOSA  Final   Report Status 02/26/2017 FINAL  Final   Organism ID, Bacteria PSEUDOMONAS AERUGINOSA  Final      Susceptibility   Pseudomonas aeruginosa - MIC*    CEFTAZIDIME 4 SENSITIVE Sensitive     CIPROFLOXACIN 0.5 SENSITIVE Sensitive     GENTAMICIN <=1 SENSITIVE Sensitive     IMIPENEM 1 SENSITIVE Sensitive     PIP/TAZO 32 SENSITIVE Sensitive     CEFEPIME 8 SENSITIVE Sensitive     * MODERATE PSEUDOMONAS  AERUGINOSA  MRSA PCR Screening     Status: None   Collection Time: 02/24/17 10:05 AM  Result Value Ref Range Status   MRSA by PCR NEGATIVE NEGATIVE Final    Comment:        The GeneXpert MRSA Assay (FDA approved for NASAL specimens only), is one  component of a comprehensive MRSA colonization surveillance program. It is not intended to diagnose MRSA infection nor to guide or monitor treatment for MRSA infections.       Radiology Studies: No results found.    Scheduled Meds: . acetaminophen (TYLENOL) oral liquid 160 mg/5 mL  650 mg Per Tube Q6H  . diltiazem  90 mg Oral Q6H  . docusate  100 mg Per Tube Daily  . glycopyrrolate  0.2 mg Intravenous Q4H  . metoprolol tartrate  50 mg Per Tube BID  . pantoprazole sodium  40 mg Per Tube Q24H  . scopolamine  1 patch Transdermal Q72H   Continuous Infusions: . morphine 2 mg/hr (02/26/17 1742)     LOS: 25 days    Time spent: Total of 15 minutes spent with pt, greater than 50% of which was spent in discussion of  treatment, counseling and coordination of care    Chipper Oman, MD Pager: Text Page via [email or web address]   If 7PM-7AM, please contact night-coverage [email or web address] 03/01/2017, 8:38 AM

## 2017-03-01 NOTE — Progress Notes (Signed)
Palliative Medicine RN Note: Daily symptom check. Pt is relaxed, RR around 18. No facial grimacing. I called his daughter Marylene Landngela to discuss pt and status, as well [facility], as [place] and often cannot get here due to financial concerns r/t travel. Marylene Landngela will discuss it with her mother and call me back.  Updated Eileen StanfordJenna.  Margret ChanceMelanie G. Sheetal Lyall, RN, BSN, [facility] 03/01/2017 2:39 PM Cell [phone] 8:00-4:00 Monday-Friday Office [phone]

## 2017-03-01 NOTE — Progress Notes (Signed)
MD called CSW to inquire if pt can transition to residential hospice- CSW called palliative medicine- they will come to evaluate patient today and discuss transfer to residential facility with family if appropriate  CSW will continue to follow and assist as needed  Burna SisJenna H. Arianne Klinge, LCSW Clinical Social Worker [phone]

## 2017-03-01 NOTE — Plan of Care (Signed)
Problem: Physical Regulation: Goal: Ability to maintain clinical measurements within normal limits will improve Outcome: Progressing Discussed with patient about placing ice packs to help cool along with medications with no teach back displayed

## 2017-03-02 MED ORDER — SCOPOLAMINE 1 MG/3DAYS TD PT72: 1.0000 | MEDICATED_PATCH | TRANSDERMAL | 12 refills | Status: AC

## 2017-03-02 MED ORDER — MORPHINE BOLUS VIA INFUSION: 2.0000 mg | INTRAVENOUS | 0 refills | Status: AC | PRN

## 2017-03-02 MED ORDER — METOPROLOL TARTRATE 25 MG/10 ML ORAL SUSPENSION: 50.0000 mg | Freq: Two times a day (BID) | ORAL | Status: AC

## 2017-03-02 MED ORDER — ACETAMINOPHEN 160 MG/5ML PO SOLN: 650.0000 mg | Freq: Four times a day (QID) | ORAL | 0 refills | Status: AC

## 2017-03-02 MED ORDER — SODIUM CHLORIDE 0.9 % IV SOLN: 2.0000 mg/h | INTRAVENOUS | Status: AC

## 2017-03-02 MED ORDER — GLYCOPYRROLATE 0.2 MG/ML IJ SOLN: 0.2000 mg | INTRAMUSCULAR | Status: AC

## 2017-03-02 MED ORDER — DILTIAZEM 12 MG/ML ORAL SUSPENSION: 90.0000 mg | Freq: Four times a day (QID) | ORAL | Status: AC

## 2017-03-02 MED ORDER — POLYVINYL ALCOHOL 1.4 % OP SOLN: 1.0000 [drp] | Freq: Four times a day (QID) | OPHTHALMIC | 0 refills | Status: AC | PRN

## 2017-03-02 MED ORDER — LORAZEPAM 2 MG/ML IJ SOLN: 1.0000 mg | INTRAMUSCULAR | 0 refills | Status: AC | PRN

## 2017-03-02 NOTE — Progress Notes (Signed)
[facility] arrived for transport. Patient is stable at time of transport. HR 100, RR 25 on room air. [facility] has all required paperwork for transport.

## 2017-03-02 NOTE — Plan of Care (Signed)
Problem: Pain Managment: Goal: General experience of comfort will improve Outcome: Progressing Patient pain appears well controlled with current orders. No signs of distress observed at this time.

## 2017-03-02 NOTE — Care Management Note (Signed)
Case Management Note  Patient Details  Name: Stuart Mendoza MRN: [id number] Date of Birth: October 17, 1955  [facility]ive[facility]. Pt's contact Dtr, Margreta JourneyAngela Mendoza # [phone] or Brother, Stuart Mendoza.  Presents with ICH and IVH with 1.7 cm imdline shift with hydrocephalus, acute resp failure ,afib, trach collar 28%. Has peg 10/19, fevers, he is nonverbal.  Palliative consulted.   11/1 1539 Stuart Capeeborah Jerod Mcquain RN, BSN- patient is for dc to hospice facility today. CSW following.                     Action/Plan: DC to Residential hospice.  Expected Discharge Date:  03/02/17               Expected Discharge Plan:  Hospice Medical Facility  In-House Referral:  Clinical Social Work  Discharge planning Services  CM Consult  Post Acute Care Choice:  NA Choice offered to:  NA  DME Arranged:  N/A DME Agency:  NA  HH Arranged:  NA HH Agency:     Status of Service:  Completed, signed off  If discussed at [employer] Meetings, dates discussed:    Additional Comments:  Stuart Mendoza, Stuart Demarest Clinton, RN 03/02/2017, 3:39 PM

## 2017-03-02 NOTE — Progress Notes (Signed)
[facility] Anticipated discharge date: 11/1 Family notified: pt dtr Transportation by [facility]- called at 3:40pm- they estimate pick up won't be till 5  CSW signing off.  Burna SisJenna H. Solon Alban, LCSW Clinical Social Worker [phone]

## 2017-03-02 NOTE — Discharge Summary (Signed)
Physician Discharge Summary  Stuart Mendoza  LEX:[id number]  DOB: 11-Oct-1955  DOA: 02/04/2017 PCP: Patient, No Pcp Per  Admit date: 02/04/2017 Discharge date: 03/02/2017  Admitted From: Home  Disposition:  Hospice   Discharge Condition: Comfort care  CODE STATUS: DNR  Diet recommendation: NPO   Brief/Interim Summary: For full details see H&P/Progress notes. Stuart Mendoza is a 61 year old male with past medical history of A. fib on Xarelto, COPD, hypertension, diabetes, alcohol abuse who was admitted on 10/6 with large right parietal ICH and IVH, and a 1.7 cm midline shift with hydrocephalus. Also was found to be in acute hypoxic respiratory failure due to hospital-acquired pneumonia and subsequently intubated. Patient was unable to be weaned off vent, trach was placed. PEG also performed.  SIGNIFICANT EVENTS: 10/6 Admit, neurology, neurosurgery consulted, kcentra, 3% NS  10/12 remains with reasonable neurostatus; s/p Tracheostomy 10/13: Tolerated ATC All day 10/14: Went back on vent 10/14 PM due to dyspnea.  10/15: minimally responsive which sounds worse than prior baseline. on TC trial has RR 35-40 and thick purulent secretions from Trach. 10/16: fever, pan-cultured, cuff leak so trach exchanged for #6 distal XLT; restart zosyn 10/17: increased tachycardia, thick tan secretions, tmax 98.8, failed SBT secondary to rr in 40's, per RT- multiple mucous plugs requiring bag/lavage 10/18: HR better controlled, tmax 102.1 but improving, Na improving with free water, following commands on right. Still having mucous plugging 10/19: PEG placed by Trauma surgery; tolerated 2hrs of PSV 10/20: RT says no further mucous plugs but does have an intermittent cuff leak that is positional (still getting good tidal volumes in 500's despite leak) 10/25: Patient continue aspirations, declining and requiring vent, recommended palliative care.  10/27 palliative care met with family and plan for comfort  care starting from 10/28 when all family arrives. 10/28 comfort care only  Subjective: Patient calm and breathing well. Today was more responsive with voice, opening his eyes upon calling his name. Vital sings stable.   Discharge Diagnoses/Hospital Course:  ICH/IHV - Large - in view of anticoagulation (Xarelto)  Given Kcentra, Neurosurgery evaluated did not recommend surgical intervention Patient placed on comfort care    Acute respiratory failure with hypoxia, VAP, mucous plugging and pulmonary edema  S/p trach 10/12 Pseudomonas and staph lugdenesis, completed abx course with Zosyn for 7 days  Continues to have multiple episodes of aspiration and mucus plugging   PAF, A. fib with RVR - HR controlled with medications, cont for now as palliative measure for tachycardia Hypertension - Stable  Type 2 diabetes mellitus - stable  Hyponatremia/hypokalemia - resolved Anemia of chronic disease and critical illness AK I due to dehydration/ace inhibitors/diuresis Diarrhea due to tube feedings - negative c diff    Discharge Instructions  You were cared for by a hospitalist during your hospital stay. If you have any questions about your discharge medications or the care you received while you were in the hospital after you are discharged, you can call the unit and asked to speak with the hospitalist on call if the hospitalist that took care of you is not available. Once you are discharged, your primary care physician will handle any further medical issues. Please note that NO REFILLS for any discharge medications will be authorized once you are discharged, as it is imperative that you return to your primary care physician (or establish a relationship with a primary care physician if you do not have one) for your aftercare needs so that they can reassess your need for medications and  monitor your lab values.  Discharge Instructions    Ambulatory referral to Neurology    Complete by:  As directed     Pt will follow up with stroke MD Erlinda Hong preferred, if not available, then consider Leonie Man, Penumalli or Ahern) at [facility] in about 2 months. Thanks.   Call MD for:  difficulty breathing, headache or visual disturbances    Complete by:  As directed    Call MD for:  extreme fatigue    Complete by:  As directed    Call MD for:  hives    Complete by:  As directed    Call MD for:  persistant dizziness or light-headedness    Complete by:  As directed    Call MD for:  persistant nausea and vomiting    Complete by:  As directed    Call MD for:  redness, tenderness, or signs of infection (pain, swelling, redness, odor or green/yellow discharge around incision site)    Complete by:  As directed    Call MD for:  severe uncontrolled pain    Complete by:  As directed    Call MD for:  temperature >100.4    Complete by:  As directed    Diet - low sodium heart healthy    Complete by:  As directed    Increase activity slowly    Complete by:  As directed      Allergies as of 03/02/2017      Reactions   Metformin And Related       Medication List    STOP taking these medications   albuterol 108 (90 Base) MCG/ACT inhaler Commonly known as:  PROVENTIL HFA;VENTOLIN HFA   amLODipine 10 MG tablet Commonly known as:  NORVASC   aspirin EC 81 MG tablet   chlorhexidine 0.12 % solution Commonly known as:  PERIDEX   fluticasone 50 MCG/ACT nasal spray Commonly known as:  FLONASE   folic acid 1 MG tablet Commonly known as:  FOLVITE   HYDROcodone-acetaminophen 10-325 MG tablet Commonly known as:  NORCO   JANUVIA 100 MG tablet Generic drug:  sitaGLIPtin   labetalol 100 MG tablet Commonly known as:  NORMODYNE   lisinopril 20 MG tablet Commonly known as:  PRINIVIL,ZESTRIL   multivitamin with minerals Tabs tablet   nicotine 14 mg/24hr patch Commonly known as:  NICODERM CQ - dosed in mg/24 hours   omeprazole 20 MG capsule Commonly known as:  PRILOSEC   SPIRIVA HANDIHALER 18 MCG inhalation  capsule Generic drug:  tiotropium   XARELTO 20 MG Tabs tablet Generic drug:  rivaroxaban     TAKE these medications   acetaminophen 160 MG/5ML solution Commonly known as:  TYLENOL Place 20.3 mLs (650 mg total) into feeding tube every 6 (six) hours.   diltiazem 10 mg/ml oral suspension Commonly known as:  CARDIZEM Take 9 mLs (90 mg total) by mouth every 6 (six) hours.   glycopyrrolate 0.2 MG/ML injection Commonly known as:  ROBINUL Inject 1 mL (0.2 mg total) into the vein every 4 (four) hours.   LORazepam 2 MG/ML injection Commonly known as:  ATIVAN Inject 0.5 mLs (1 mg total) into the vein every 4 (four) hours as needed for anxiety or seizure.   metoprolol tartrate 25 mg/10 mL Susp Commonly known as:  LOPRESSOR Place 20 mLs (50 mg total) into feeding tube 2 (two) times daily.   morphine 250 mg in sodium chloride 0.9 % 250 mL Inject 2 mg/hr into the vein continuous.  morphine 5 mg/mL Soln Inject 2 mg into the vein every 30 (thirty) minutes as needed (dyspnea).   polyvinyl alcohol 1.4 % ophthalmic solution Commonly known as:  LIQUIFILM TEARS Place 1 drop into both eyes 4 (four) times daily as needed for dry eyes.   scopolamine 1 MG/3DAYS Commonly known as:  TRANSDERM-SCOP Place 1 patch (1.5 mg total) onto the skin every 3 (three) days.      Follow-up Information    Rosalin Hawking, MD. Schedule an appointment as soon as possible for a visit in 2 month(s).   Specialty:  Neurology Contact information: [address] [place] [address] [phone]          Allergies  Allergen Reactions  . Metformin And Related     Consultations:  PCCM   Neurology   Palliative care  Procedures/Studies: Ct Head Wo Contrast  Result Date: 02/07/2017 CLINICAL DATA:  Intracranial hemorrhage.  Follow-up. EXAM: CT HEAD WITHOUT CONTRAST TECHNIQUE: Contiguous axial images were obtained from the base of the skull through the vertex without intravenous contrast.  COMPARISON:  02/04/2017 FINDINGS: Brain: Large parenchymal hematoma centered in the right parietal lobe has not significantly changed in size, measuring 7.8 x 5.9 x 6.9 cm (estimated volume of 159 cc). Intraventricular extension is unchanged with blood in the right greater than left lateral ventricles. Mild dilatation of the temporal horn of the right lateral ventricle is unchanged. Small hemorrhagic contusions in the anterior left frontal lobe are unchanged, with the larger measuring 8 mm. Edema surrounding the right parietal hematoma is unchanged. Midline shift does not appear significantly changed upon remeasurement, currently 13 mm. Moderate encephalomalacia is again noted in the anterior frontal lobes bilaterally. Vascular: Calcified atherosclerosis at the skullbase. No hyperdense vessel. Skull: No fracture or focal osseous lesion. Sinuses/Orbits: No significant inflammatory disease in the visualized paranasal sinuses. Trace bilateral mastoid effusions. Unremarkable orbits. Other: None. IMPRESSION: 1. Unchanged large right parietal hematoma, surrounding edema, and midline shift. 2. Unchanged small hemorrhagic contusion in the anterior left frontal lobe. 3. Unchanged intraventricular blood with mild right temporal horn dilatation. Electronically Signed   By: Logan Bores M.D.   On: 02/07/2017 16:56   Ct Head Wo Contrast  Result Date: 02/04/2017 CLINICAL DATA:  Follow-up intracranial hemorrhage EXAM: CT HEAD WITHOUT CONTRAST TECHNIQUE: Contiguous axial images were obtained from the base of the skull through the vertex without intravenous contrast. COMPARISON:  02/04/2017 @ 1058 hours FINDINGS: Brain: Large stable 7.3 x 5.8 x 7.4 cm intraparenchymal hematoma in the right parietal lobe with intraventricular rupture and surrounding vasogenic edema. Small amount of blood dependently in the occipital horn of the left lateral ventricle. Complete effacement of the occipital horn of the right lateral ventricle with  hemorrhage in the right lateral with mild stable dilatation of the right temporal horn. Severe mass effect with right to left midline shift measuring 17 mm similar in appearance to the earlier examination. Small intraparenchymal hemorrhage in the inferior left frontal lobe. Bilateral frontal lobe encephalomalacia. Generalized cerebral atrophy. Periventricular white matter low attenuation likely secondary to microangiopathy. Vascular: Cerebrovascular atherosclerotic calcifications are noted. Skull: Negative for fracture or focal lesion. Sinuses/Orbits: Visualized portions of the orbits are unremarkable. Visualized portions of the paranasal sinuses and mastoid air cells are unremarkable. Other: None. IMPRESSION: 1. Stable large 7.3 x 5.8 x 7.4 cm intraparenchymal hemorrhage in the right parietal lobe with intraventricular rupture. Stable persistent 17 mm of right to left midline shift. Persistent mild dilatation of the temporal horn of the  right lateral ventricle without significant hydrocephalus. Electronically Signed   By: Kathreen Devoid   On: 02/04/2017 17:43   Ct Head Wo Contrast  Result Date: 02/04/2017 CLINICAL DATA:  Unresponsive.  Multiple falls. EXAM: CT HEAD WITHOUT CONTRAST TECHNIQUE: Contiguous axial images were obtained from the base of the skull through the vertex without intravenous contrast. COMPARISON:  01/28/2017 FINDINGS: Brain: There is a large intraparenchymal hemorrhage involving the right parietal lobe and right occipital lobe. This hemorrhage is causing 1.7 cm of right to left midline shift. There is blood in the right lateral ventricle particularly at the right temporal horn. Small amount of blood in the posterior left lateral ventricle. No significant dilatation of the ventricles. There is some compression of the third ventricle. This large intraparenchymal hemorrhage measures roughly 7.4 x 6.2 x 7.4 cm. Low-density edema surrounding this large hemorrhage. There is also a small amount of  intracranial blood at the base of the left frontal lobe. Patient has chronic encephalomalacia in the bilateral frontal lobes. There may be a small amount of subdural blood along the falx. Vascular: No hyperdense vessel or unexpected calcification. Skull: No acute bone abnormality.  No calvarial fracture. Sinuses/Orbits: Fluid in nasopharynx and nasal cavity. No significant disease in the visualized paranasal sinuses. Other: Patient is intubated. IMPRESSION: Large intracranial hemorrhage centered in the right parietal lobe. 1.7 cm of midline shift with intraventricular blood. Small amount of hemorrhage in the left frontal lobe and a small amount of subdural blood. Critical Value/emergent results were called by telephone at the time of interpretation on 02/04/2017 at 11:10 am to Dr. Duffy Bruce , who verbally acknowledged these results. Electronically Signed   By: Markus Daft M.D.   On: 02/04/2017 11:19   Ct Abdomen Pelvis W Contrast  Result Date: 02/16/2017 CLINICAL DATA:  Respiratory failure, intracranial hemorrhage and evaluation prior to possible surgical gastrostomy. History of prior abdominal surgery. EXAM: CT ABDOMEN AND PELVIS WITH CONTRAST TECHNIQUE: Multidetector CT imaging of the abdomen and pelvis was performed using the standard protocol following bolus administration of intravenous contrast. CONTRAST:  158m ISOVUE-300 IOPAMIDOL (ISOVUE-300) INJECTION 61% COMPARISON:  None. FINDINGS: Lower chest: Dense atelectasis/consolidation of the posterior left lower lobe. Mild atelectasis at the posterior right lung base. No significant pleural fluid at the lung bases. Hepatobiliary: Contour of the liver is suggestive of underlying cirrhosis. No hepatic masses or biliary dilatation. The gallbladder appears unremarkable. Pancreas: Unremarkable. No pancreatic ductal dilatation or surrounding inflammatory changes. Spleen: Well-circumscribed perfusion defect of the posterior spleen which persists on delayed  imaging is suggestive of focal splenic infarct. This involves an overall small amount of splenic volume, roughly 10-15%. No associated evidence splenic hematoma, perisplenic fluid or obvious abscess. Adrenals/Urinary Tract: Adrenal glands appear unremarkable. Atherosclerosis of bilateral renal arteries. The right kidney appears atrophic compared to the left. No evidence of hydronephrosis or renal masses. The bladder is unremarkable. Stomach/Bowel: Gastric decompression tube enters the stomach. The stomach is normally positioned and abuts the anterior abdominal wall. No evidence of anatomy prohibitive to gastrostomy. Mild gas is dilatation of a loop of proximal jejunum without evidence of bowel obstruction or significant ileus. There almost appears to be a communication between the greater curvature of the stomach and a loop of adjacent jejunum. However, a normally positioned duodenum and duodenal-jejunal junction appear to be present and this may be a fake out by CT. However, endoscopic correlation may be helpful in determining if there is a fistula between the stomach and small bowel. Short appearance of  the ascending colon may be consistent with prior partial right colectomy. Mild edema in the mesentery without ascites or focal abscess. No free air. Vascular/Lymphatic: The abdominal aorta and iliac arteries are heavily calcified. No evidence of aortic aneurysm. No enlarged lymph nodes are identified in the abdomen or pelvis. Reproductive: Prostate is unremarkable. Other: Evidence of prior midline surgical incision without hernia. Musculoskeletal: No acute or significant osseous findings. IMPRESSION: 1. No anatomic contraindication to gastrostomy. However, there is a possible communication between the greater curvature of the stomach and a loop of adjacent jejunum. This may be a fake out by CT. Endoscopic correlation may be helpful in determining if there is a fistula between the stomach and small bowel. This does  not appear to represent classic gastrojejunal bypass given normal course and appearance of the duodenum and a normally visualized duodenal-jejunal junction. 2. Short appearance of the ascending colon may be consistent with prior partial right colectomy. 3. Mild edema in the mesentery without overt ascites or evidence of focal abscess. 4. Perfusion abnormality of the posterior spleen suggestive of a small splenic infarct which is age indeterminate. This only involves approximately 10-15% of the splenic volume. Electronically Signed   By: Aletta Edouard M.D.   On: 02/16/2017 09:19   Dg Chest Port 1 View  Result Date: 02/24/2017 CLINICAL DATA:  Tracheostomy.  Ventilator support.  Fever. EXAM: PORTABLE CHEST 1 VIEW COMPARISON:  02/23/2017 FINDINGS: Tracheostomy remains in place. The lungs remain clear except for mild atelectasis at both lung bases. Early pneumonia not excluded. No effusions. No edema. IMPRESSION: Persistent mild patchy density both lung bases that could be atelectasis or mild pneumonia. No worsening or new finding. Electronically Signed   By: Nelson Chimes M.D.   On: 02/24/2017 08:08   Dg Chest Port 1 View  Result Date: 02/23/2017 CLINICAL DATA:  Shortness of breath. History of COPD, hypertension and stroke. EXAM: PORTABLE CHEST 1 VIEW COMPARISON:  Chest radiograph February 22, 2017 FINDINGS: Stable cardiomegaly. Similar diffuse interstitial prominence without pleural effusion or focal consolidation. Stable tracheostomy tube. No pneumothorax. Healing RIGHT humeral head and neck fracture. LEFT rib ORIF. IMPRESSION: Stable cardiomegaly and interstitial prominence. Electronically Signed   By: Elon Alas M.D.   On: 02/23/2017 00:40   Dg Chest Port 1 View  Result Date: 02/22/2017 CLINICAL DATA:  Tracheostomy patient.  Fever. EXAM: PORTABLE CHEST 1 VIEW COMPARISON:  Radiographs 02/20/2017. FINDINGS: 0902 hour. Tracheostomy appears unchanged. There is stable cardiomegaly. The right lower  lobe remains re-expanded. There is mild bibasilar atelectasis. No pneumothorax, confluent airspace opacity or significant pleural effusions. Old rib fractures are present bilaterally with postsurgical changes on the left. There is an old fracture of the left clavicle. IMPRESSION: Stable mild residual bibasilar atelectasis and cardiomegaly. No acute findings. Electronically Signed   By: Richardean Sale M.D.   On: 02/22/2017 09:19   Dg Chest Port 1 View  Result Date: 02/20/2017 CLINICAL DATA:  Atelectasis EXAM: PORTABLE CHEST 1 VIEW COMPARISON:  Plain film from earlier same day. FINDINGS: Significantly improved aeration at the right lung base. Probable mild atelectasis at the left lung base. Heart size and mediastinal contours appear stable. Tracheostomy tube remains appropriately positioned at the midline. No pneumothorax seen. IMPRESSION: Significantly improved aeration at the right lung base, presumably resolving atelectasis. Electronically Signed   By: Franki Cabot M.D.   On: 02/20/2017 19:45   Dg Chest Port 1 View  Result Date: 02/20/2017 CLINICAL DATA:  Acute respiratory failure EXAM: PORTABLE CHEST 1 VIEW  COMPARISON:  01/2027 FINDINGS: Tracheostomy tube unchanged. Patient rotated rightward. Interval increase in RIGHT lower lobe atelectasis. Potential new effusion. LEFT lung clear. Plate fixation of LEFT lateral ribs. IMPRESSION: 1. New RIGHT lower lobe atelectasis. Evaluate for mucous plugging of RIGHT lower lobe bronchus 2. Tracheostomy tube unchanged. These results will be called to the ordering clinician or representative by the Radiologist Assistant, and communication documented in the PACS or zVision Dashboard. Electronically Signed   By: Suzy Bouchard M.D.   On: 02/20/2017 07:58   Dg Chest Port 1 View  Result Date: 02/18/2017 CLINICAL DATA:  Ventilator associated pneumonia. EXAM: PORTABLE CHEST 1 VIEW COMPARISON:  02/17/2017 and prior radiograph FINDINGS: Cardiomegaly and a tracheostomy  tube again noted. Bibasilar atelectasis bibasilar opacities are slightly improved. There is no evidence of pneumothorax. No other significant change. IMPRESSION: Slightly improved bibasilar atelectasis/aeration without other significant change. Electronically Signed   By: Margarette Canada M.D.   On: 02/18/2017 07:03   Dg Chest Port 1 View  Result Date: 02/17/2017 CLINICAL DATA:  Atelectasis EXAM: PORTABLE CHEST 1 VIEW COMPARISON:  02/16/2017 FINDINGS: Tracheostomy tube and feeding tube are unchanged. Cardiomegaly with vascular congestion. Worsening aeration in the lung bases. This may reflect a combination of edema and atelectasis is well is layering effusions. No acute bony abnormality. IMPRESSION: Worsening aeration in the lower lobes, likely a combination of edema, atelectasis and effusions. Electronically Signed   By: Rolm Baptise M.D.   On: 02/17/2017 07:15   Dg Chest Port 1 View  Result Date: 02/16/2017 CLINICAL DATA:  Respiratory distress, question aspiration, history stroke, hypertension, diabetes mellitus, COPD, atrial fibrillation, smoker EXAM: PORTABLE CHEST 1 VIEW COMPARISON:  Portable exam 1608 hours compared to 02/15/2017 FINDINGS: Tip of tracheostomy tube projects 6.5 cm above carina. Feeding tube extends into stomach. Enlargement of cardiac silhouette. Mediastinal contours and pulmonary vascularity normal. Atherosclerotic calcification aorta. Atelectasis versus consolidation in retrocardiac LEFT lower lobe. Remaining lungs clear. No definite pleural effusion or pneumothorax. Prior plating of multiple lower LEFT ribs. Bones demineralized. IMPRESSION: Enlargement of cardiac silhouette. Atelectasis versus consolidation in retrocardiac LEFT lower lobe. Electronically Signed   By: Lavonia Dana M.D.   On: 02/16/2017 16:35   Dg Chest Port 1 View  Result Date: 02/15/2017 CLINICAL DATA:  Hypoxia, respiratory failure. History of hypertension, stroke, COPD and atrial fibrillation. EXAM: PORTABLE CHEST  1 VIEW COMPARISON:  02/14/2017; 02/13/2017; 02/11/2027 FINDINGS: Grossly unchanged enlarged cardiac silhouette and mediastinal contours. Atherosclerotic plaque when the thoracic aorta. Stable positioning of support apparatus. Lungs remain hyperexpanded with flattening of the diaphragms. Left basilar heterogeneous / consolidative opacities are unchanged. No new focal airspace opacities. No pleural effusion or pneumothorax. Post sideplate fixation several left-sided ribs. IMPRESSION: 1.  Stable positioning of support apparatus.  No pneumothorax. 2. Similar findings of lung hyperexpansion and left basilar opacities, atelectasis versus infiltrate. Electronically Signed   By: Sandi Mariscal M.D.   On: 02/15/2017 07:47   Dg Chest Port 1 View  Result Date: 02/14/2017 CLINICAL DATA:  Tracheostomy EXAM: PORTABLE CHEST 1 VIEW COMPARISON:  02/13/2017 FINDINGS: Cardiac shadow is enlarged but stable. Postsurgical changes in left chest wall are noted. A tracheostomy tube and feeding catheter are again noted and stable. Elevation of left hemidiaphragm with left basilar atelectasis is seen. No other focal abnormality is noted. IMPRESSION: Left basilar atelectasis new from the prior exam. Electronically Signed   By: Inez Catalina M.D.   On: 02/14/2017 16:39   Dg Chest Port 1 View  Result Date: 02/13/2017 CLINICAL  DATA:  Tachypnea. EXAM: PORTABLE CHEST 1 VIEW COMPARISON:  02/10/2017 FINDINGS: Tracheostomy tube in good position. Feeding tube tip below the diaphragm. Left-sided central line has been removed since the prior exam. Chronic cardiomegaly. Pulmonary vascularity is normal. Lungs are now clear. No effusions. No acute bone abnormality. IMPRESSION: Resolution of atelectasis at the lung bases. The lungs are now clear. Chronic mild cardiomegaly. Electronically Signed   By: Lorriane Shire M.D.   On: 02/13/2017 15:12   Dg Chest Port 1 View  Result Date: 02/10/2017 CLINICAL DATA:  Acute respiratory acidosis. EXAM: PORTABLE  CHEST 1 VIEW COMPARISON:  Radiograph of same day. FINDINGS: Stable cardiomegaly. There is been interval removal of endotracheal and nasogastric tubes. Interval placement of tracheostomy tube which is in grossly good position. Left subclavian catheter is noted with distal tip in expected position of the SVC. No pneumothorax is noted. No pleural effusion is noted. Minimal bibasilar subsegmental atelectasis is noted. Old left clavicular fracture is noted. IMPRESSION: Interval placement of tracheostomy tube which is in grossly good position. Minimal bibasilar subsegmental atelectasis is noted. Electronically Signed   By: Marijo Conception, M.D.   On: 02/10/2017 10:17   Dg Chest Port 1 View  Result Date: 02/10/2017 CLINICAL DATA:  Respiratory difficulty EXAM: PORTABLE CHEST 1 VIEW COMPARISON:  02/08/2017 FINDINGS: Endotracheal and NG tubes stable. Left subclavian central venous catheter stable. Moderate cardiomegaly stable. Diffuse edema improved. Bibasilar haziness may represent small effusions and this is stable. Plates and screws on left ribs unchanged. No pneumothorax. IMPRESSION: Improving edema.  Stable support apparatus. Electronically Signed   By: Marybelle Killings M.D.   On: 02/10/2017 06:50   Dg Chest Port 1 View  Result Date: 02/08/2017 CLINICAL DATA:  On mechanically assisted ventilation. EXAM: PORTABLE CHEST 1 VIEW COMPARISON:  02/07/2017 FINDINGS: Endotracheal tube is 5.0 cm above the carina. Nasogastric tube extends into the abdomen. Left subclavian central line tip in the upper SVC region. The heart size remains enlarged. Persistent densities in the lower chest bilaterally. Negative for a pneumothorax. Surgical plates in left ribs. IMPRESSION: Stable chest radiograph findings. Persistent bibasilar chest densities. Chest densities may represent a combination of pleural fluid and atelectasis/ consolidation. Support apparatuses as described. Stable cardiomegaly. Electronically Signed   By: Markus Daft M.D.    On: 02/08/2017 07:52   Dg Chest Port 1 View  Result Date: 02/07/2017 CLINICAL DATA:  Ventilator EXAM: PORTABLE CHEST 1 VIEW COMPARISON:  02/04/2017 FINDINGS: Support devices are stable. Cardiomegaly with vascular congestion. Perihilar and bibasilar opacities noted. Mild interstitial prominence could reflect interstitial edema. Question small left effusion. IMPRESSION: Vascular congestion with possible mild interstitial edema. Areas of perihilar and bibasilar opacities, likely atelectasis. Electronically Signed   By: Rolm Baptise M.D.   On: 02/07/2017 07:12   Dg Chest Port 1 View  Result Date: 02/04/2017 CLINICAL DATA:  Intracranial hemorrhage.  Right lung lavage. EXAM: PORTABLE CHEST 1 VIEW COMPARISON:  One-view chest x-ray from the same day at 12:50 p.m. FINDINGS: The patient remains intubated. The endotracheal tube terminates 5.1 cm above the carina. A left subclavian line is stable. NG tube courses off the inferior border of film. There is significant re-expansion of the right lung. Residual airspace disease is noted at the base. Left lung remains clear. IMPRESSION: 1. Near complete re-expansion of the right lung with some residual airspace disease at the right base. 2. The support apparatus is stable. Electronically Signed   By: San Morelle M.D.   On: 02/04/2017 15:07  Dg Chest Port 1 View  Result Date: 02/04/2017 CLINICAL DATA:  Intracerebral hemorrhage. EXAM: PORTABLE CHEST 1 VIEW COMPARISON:  Chest x-ray from same day at 1001. FINDINGS: Endotracheal tube in place with tip approximately 4.1 cm above the level of the carina. Enteric tube entering the stomach with tip below the field of view. Stable cardiomegaly. New complete opacification of the right hemithorax with rightward shift of the mediastinum. The left lung is clear. No pneumothorax. No acute osseous abnormality. IMPRESSION: 1. New complete opacification of the right hemithorax with volume loss, which could reflect large central  mucous plugging and collapse, and/or new pleural effusion. 2. Appropriate positioning of the endotracheal tube. Electronically Signed   By: Titus Dubin M.D.   On: 02/04/2017 13:29   Dg Chest Portable 1 View  Result Date: 02/04/2017 CLINICAL DATA:  Intubation. EXAM: PORTABLE CHEST 1 VIEW COMPARISON:  None. FINDINGS: Heart size is mildly enlarged. Endotracheal tube tip is 4 cm above the carina. Nasogastric tube enters the stomach. The left lung is clear. There is right upper lobe infiltrate and collapse. Right lower lung is clear. No effusions. No acute bone finding. Previous rib reconstructions on the left. IMPRESSION: Right upper lobe infiltrate and collapse. Endotracheal tube tip 4 cm above the carina. Nasogastric tube in the stomach. Electronically Signed   By: Nelson Chimes M.D.   On: 02/04/2017 10:14    Discharge Exam: Vitals:   03/02/17 0808 03/02/17 1211  BP:    Pulse:    Resp:    Temp:    SpO2: 92% 92%   Vitals:   03/02/17 0752 03/02/17 0755 03/02/17 0808 03/02/17 1211  BP:      Pulse:      Resp: 18     Temp:  (!) 97.2 F (36.2 C)    TempSrc:  Axillary    SpO2: (!) 89%  92% 92%  Weight:      Height:        General: Poorly responsive  Cardiovascular: RRR, S1/S2 +, no rubs, no gallops Respiratory: CTA bilaterally, no wheezing, no rhonchi Abdominal: Soft, NT, ND, bowel sounds +   The results of significant diagnostics from this hospitalization (including imaging, microbiology, ancillary and laboratory) are listed below for reference.     Microbiology: Recent Results (from the past 240 hour(s))  C difficile quick scan w PCR reflex     Status: None   Collection Time: 02/24/17  4:45 AM  Result Value Ref Range Status   C Diff antigen NEGATIVE NEGATIVE Final   C Diff toxin NEGATIVE NEGATIVE Final   C Diff interpretation No C. difficile detected.  Final  Culture, blood (routine x 2)     Status: None   Collection Time: 02/24/17  5:28 AM  Result Value Ref Range Status    Specimen Description BLOOD RIGHT HAND  Final   Special Requests   Final    BOTTLES DRAWN AEROBIC ONLY Blood Culture adequate volume   Culture NO GROWTH 5 DAYS  Final   Report Status 03/01/2017 FINAL  Final  Culture, blood (routine x 2)     Status: None   Collection Time: 02/24/17  5:38 AM  Result Value Ref Range Status   Specimen Description BLOOD LEFT HAND  Final   Special Requests   Final    BOTTLES DRAWN AEROBIC ONLY Blood Culture adequate volume   Culture NO GROWTH 5 DAYS  Final   Report Status 03/01/2017 FINAL  Final  Culture, respiratory (NON-Expectorated)  Status: None   Collection Time: 02/24/17  8:10 AM  Result Value Ref Range Status   Specimen Description TRACHEAL ASPIRATE  Final   Special Requests NONE  Final   Gram Stain   Final    FEW WBC PRESENT, PREDOMINANTLY PMN FEW SQUAMOUS EPITHELIAL CELLS PRESENT MODERATE GRAM NEGATIVE RODS MODERATE GRAM POSITIVE RODS MODERATE GRAM POSITIVE COCCI    Culture MODERATE PSEUDOMONAS AERUGINOSA  Final   Report Status 02/26/2017 FINAL  Final   Organism ID, Bacteria PSEUDOMONAS AERUGINOSA  Final      Susceptibility   Pseudomonas aeruginosa - MIC*    CEFTAZIDIME 4 SENSITIVE Sensitive     CIPROFLOXACIN 0.5 SENSITIVE Sensitive     GENTAMICIN <=1 SENSITIVE Sensitive     IMIPENEM 1 SENSITIVE Sensitive     PIP/TAZO 32 SENSITIVE Sensitive     CEFEPIME 8 SENSITIVE Sensitive     * MODERATE PSEUDOMONAS AERUGINOSA  MRSA PCR Screening     Status: None   Collection Time: 02/24/17 10:05 AM  Result Value Ref Range Status   MRSA by PCR NEGATIVE NEGATIVE Final    Comment:        The GeneXpert MRSA Assay (FDA approved for NASAL specimens only), is one component of a comprehensive MRSA colonization surveillance program. It is not intended to diagnose MRSA infection nor to guide or monitor treatment for MRSA infections.      Labs: BNP (last 3 results) No results for input(s): BNP in the last 8760 hours. Basic Metabolic  Panel:  Recent Labs Lab 02/24/17 0258 02/24/17 1921 02/25/17 0539  NA 147*  --  146*  K 4.8  --  4.4  CL 110  --  108  CO2 26  --  26  GLUCOSE 197*  --  173*  BUN 50*  --  46*  CREATININE 1.38*  --  1.00  CALCIUM 10.3  --  9.9  MG  --  2.5*  --    Liver Function Tests: No results for input(s): AST, ALT, ALKPHOS, BILITOT, PROT, ALBUMIN in the last 168 hours. No results for input(s): LIPASE, AMYLASE in the last 168 hours. No results for input(s): AMMONIA in the last 168 hours. CBC:  Recent Labs Lab 02/24/17 0258 02/25/17 0539  WBC 25.0* 20.9*  NEUTROABS 21.7*  --   HGB 11.4* 10.9*  HCT 37.5* 36.6*  MCV 91.2 92.0  PLT 546* 386   Cardiac Enzymes: No results for input(s): CKTOTAL, CKMB, CKMBINDEX, TROPONINI in the last 168 hours. BNP: Invalid input(s): POCBNP CBG:  Recent Labs Lab 02/26/17 0333 02/26/17 0817 02/26/17 2039 02/26/17 2316 02/27/17 0338  GLUCAP 89 163* 147* 135* 123*   D-Dimer No results for input(s): DDIMER in the last 72 hours. Hgb A1c No results for input(s): HGBA1C in the last 72 hours. Lipid Profile No results for input(s): CHOL, HDL, LDLCALC, TRIG, CHOLHDL, LDLDIRECT in the last 72 hours. Thyroid function studies No results for input(s): TSH, T4TOTAL, T3FREE, THYROIDAB in the last 72 hours.  Invalid input(s): FREET3 Anemia work up No results for input(s): VITAMINB12, FOLATE, FERRITIN, TIBC, IRON, RETICCTPCT in the last 72 hours. Urinalysis    Component Value Date/Time   COLORURINE YELLOW 02/14/2017 1015   APPEARANCEUR HAZY (A) 02/14/2017 1015   LABSPEC 1.022 02/14/2017 1015   PHURINE 5.0 02/14/2017 1015   GLUCOSEU >=500 (A) 02/14/2017 1015   HGBUR NEGATIVE 02/14/2017 1015   [place] 02/14/2017 [place] 02/14/2017 1015   PROTEINUR NEGATIVE 02/14/2017 1015   NITRITE NEGATIVE  02/14/2017 1015   LEUKOCYTESUR NEGATIVE 02/14/2017 1015   Sepsis Labs Invalid input(s): PROCALCITONIN,  WBC,   LACTICIDVEN Microbiology Recent Results (from the past 240 hour(s))  C difficile quick scan w PCR reflex     Status: None   Collection Time: 02/24/17  4:45 AM  Result Value Ref Range Status   C Diff antigen NEGATIVE NEGATIVE Final   C Diff toxin NEGATIVE NEGATIVE Final   C Diff interpretation No C. difficile detected.  Final  Culture, blood (routine x 2)     Status: None   Collection Time: 02/24/17  5:28 AM  Result Value Ref Range Status   Specimen Description BLOOD RIGHT HAND  Final   Special Requests   Final    BOTTLES DRAWN AEROBIC ONLY Blood Culture adequate volume   Culture NO GROWTH 5 DAYS  Final   Report Status 03/01/2017 FINAL  Final  Culture, blood (routine x 2)     Status: None   Collection Time: 02/24/17  5:38 AM  Result Value Ref Range Status   Specimen Description BLOOD LEFT HAND  Final   Special Requests   Final    BOTTLES DRAWN AEROBIC ONLY Blood Culture adequate volume   Culture NO GROWTH 5 DAYS  Final   Report Status 03/01/2017 FINAL  Final  Culture, respiratory (NON-Expectorated)     Status: None   Collection Time: 02/24/17  8:10 AM  Result Value Ref Range Status   Specimen Description TRACHEAL ASPIRATE  Final   Special Requests NONE  Final   Gram Stain   Final    FEW WBC PRESENT, PREDOMINANTLY PMN FEW SQUAMOUS EPITHELIAL CELLS PRESENT MODERATE GRAM NEGATIVE RODS MODERATE GRAM POSITIVE RODS MODERATE GRAM POSITIVE COCCI    Culture MODERATE PSEUDOMONAS AERUGINOSA  Final   Report Status 02/26/2017 FINAL  Final   Organism ID, Bacteria PSEUDOMONAS AERUGINOSA  Final      Susceptibility   Pseudomonas aeruginosa - MIC*    CEFTAZIDIME 4 SENSITIVE Sensitive     CIPROFLOXACIN 0.5 SENSITIVE Sensitive     GENTAMICIN <=1 SENSITIVE Sensitive     IMIPENEM 1 SENSITIVE Sensitive     PIP/TAZO 32 SENSITIVE Sensitive     CEFEPIME 8 SENSITIVE Sensitive     * MODERATE PSEUDOMONAS AERUGINOSA  MRSA PCR Screening     Status: None   Collection Time: 02/24/17 10:05 AM  Result  Value Ref Range Status   MRSA by PCR NEGATIVE NEGATIVE Final    Comment:        The GeneXpert MRSA Assay (FDA approved for NASAL specimens only), is one component of a comprehensive MRSA colonization surveillance program. It is not intended to diagnose MRSA infection nor to guide or monitor treatment for MRSA infections.      Time coordinating discharge: 35 minutes  SIGNED:  Chipper Oman, MD  [facility] 03/02/2017, 3:28 PM  Pager please text page via  [email or web address] Password [email or web address]

## 2017-03-02 NOTE — Progress Notes (Signed)
CSW spoke with pt ex-wife who is at same number at pt dtr- she states pt dtr will not be back until around 1pm but she will give her my number to contact CSW when she returns  CSW continuing to follow  Burna SisJenna H. Leshia Kope, LCSW Clinical Social Worker [phone]

## 2017-03-02 NOTE — Consult Note (Signed)
[facility]--Rec'd referral from ZimmermanJenna SW [phone].  Evaluated pt at bedside and he has been approved [facility]  Reached out to Engelhard Corporationdaughter--Angela Willets by phone [phone] and discussed [facility] including philosophy of comfort care, multidisciplinary team approach to care, 2 levels-of-care, financial subsidy available, etc.  She [facility] her [facility].  She cannot come to the hospital this afternoon to complete the Hospice consent forms.  Arrangements have been made for Hospice Liaison to go to her home in [place] to get the necessary consent forms signed so [facility].  Thank you for the opportunity to serve pt and family during this difficult time.  Julius BowelsJoAnn N. Scott, RN

## 2017-03-02 NOTE — Progress Notes (Signed)
Report given to Thereasa Distanceodney, RN of [facility].

## 2017-03-03 NOTE — Progress Notes (Signed)
48ml of morphine gtt was wasted in the sink. Witnessed by Darlis LoanBecky Cummings, RN

## 2017-04-01 DEATH — deceased

## 2017-04-13 NOTE — Progress Notes (Deleted)
PT Evaluation Addendum Late entry for missed G-code on 01/30/2017 Based on review of documentation and goals    01/30/17 1300  PT G-Codes **NOT FOR INPATIENT CLASS**  Functional Assessment Tool Used AM-PAC 6 Clicks Basic Mobility;Clinical judgement  Functional Limitation Mobility: Walking and moving around  Mobility: Walking and Moving Around Current Status (H0865(G8978) CJ  Mobility: Walking and Moving Around Goal Status (H8469(G8979) CI  04/13/2017 Corlis HoveMargie Arlisa Leclere, PT [phone]

## 2017-04-13 NOTE — Progress Notes (Signed)
PT Evaluation Addendum Late entry for missed G-code on 01/30/2017 Based on review of documentation and goals     04/13/2017 [facility], PT [phone]

## 2017-10-23 IMAGING — DX DG CHEST 1V PORT
1 series · 1 of 1 positions shown · non-contrast
Comparison: [DATE]

CLINICAL DATA: Acute respiratory failure

EXAM:
PORTABLE CHEST 1 VIEW

[chest ap]
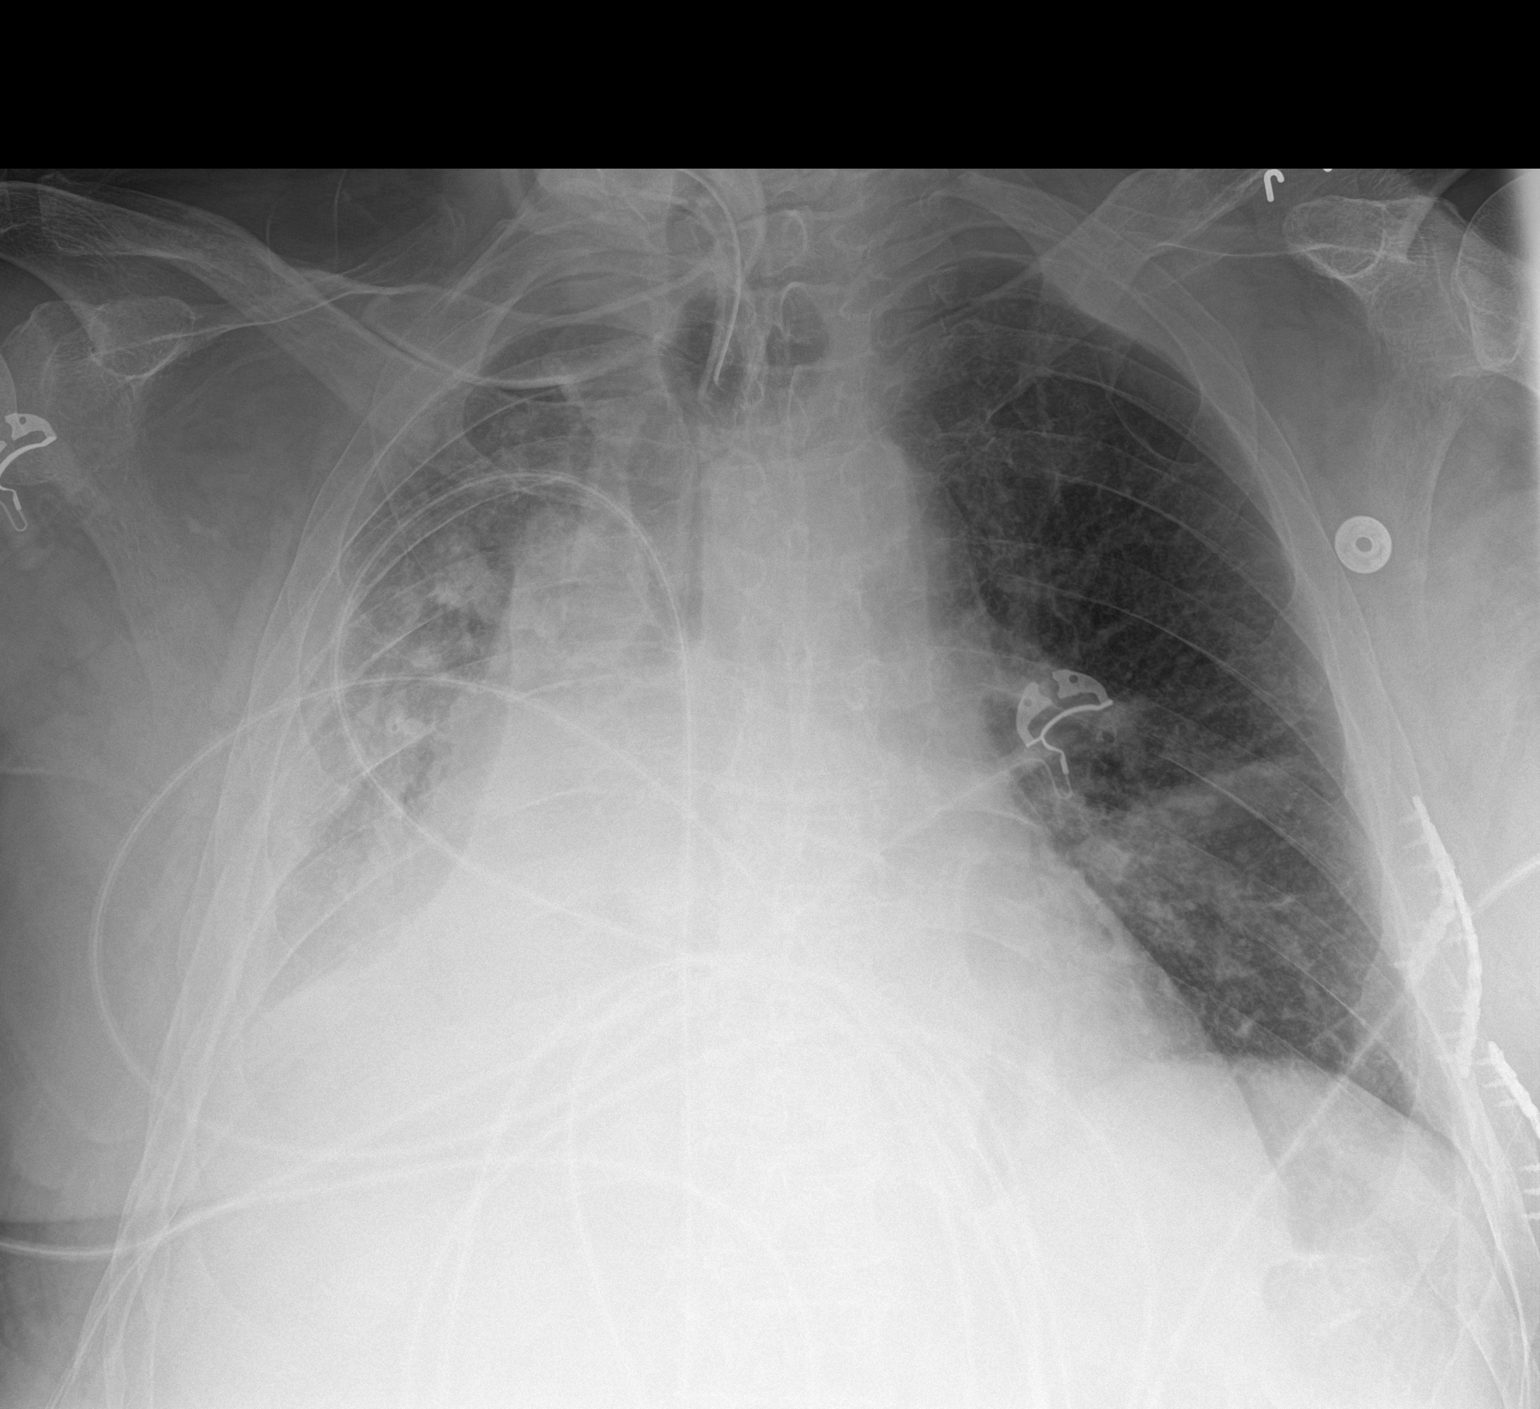

[1 of 1 positions shown; findings below may reference images not displayed]

FINDINGS: Tracheostomy tube unchanged. Patient rotated rightward. Interval
increase in RIGHT lower lobe atelectasis. Potential new effusion.
LEFT lung clear. Plate fixation of LEFT lateral ribs.
IMPRESSION: 1. New RIGHT lower lobe atelectasis. Evaluate for mucous plugging of
RIGHT lower lobe bronchus
2. Tracheostomy tube unchanged.

These results will be called to the ordering clinician or
representative by the Radiologist Assistant, and communication
documented in the PACS or zVision Dashboard.

## 2017-10-25 IMAGING — DX DG CHEST 1V PORT
1 series · 1 of 1 positions shown · non-contrast
Comparison: Radiographs 02/20/2017.

CLINICAL DATA: Tracheostomy patient.  Fever.

EXAM:
PORTABLE CHEST 1 VIEW

[chest ap]
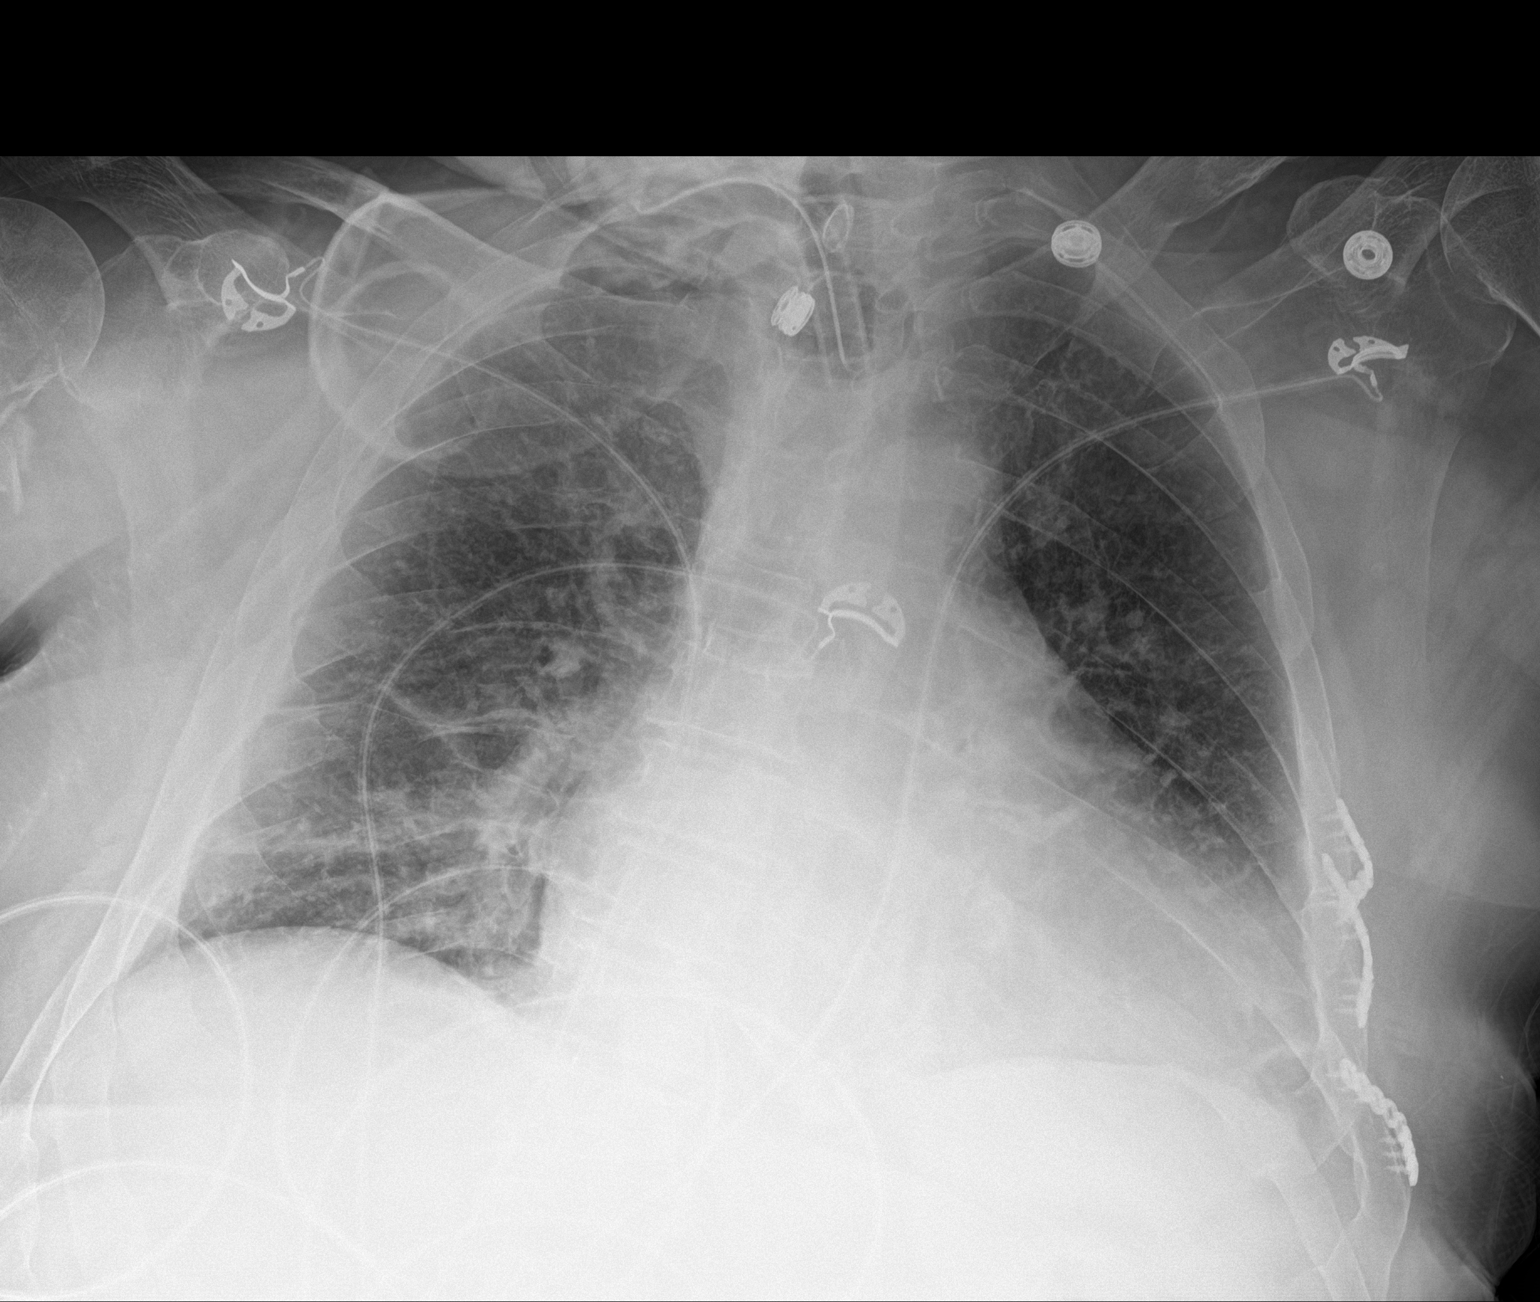

[1 of 1 positions shown; findings below may reference images not displayed]

FINDINGS: 3334 hour. Tracheostomy appears unchanged. There is stable
cardiomegaly. The right lower lobe remains re-expanded. There is
mild bibasilar atelectasis. No pneumothorax, confluent airspace
opacity or significant pleural effusions. Old rib fractures are
present bilaterally with postsurgical changes on the left. There is
an old fracture of the left clavicle.
IMPRESSION: Stable mild residual bibasilar atelectasis and cardiomegaly. No
acute findings.

## 2017-10-27 IMAGING — DX DG CHEST 1V PORT
1 series · 1 of 1 positions shown · non-contrast
Comparison: 02/23/2017

CLINICAL DATA: Tracheostomy.  Ventilator support.  Fever.

EXAM:
PORTABLE CHEST 1 VIEW

[chest]
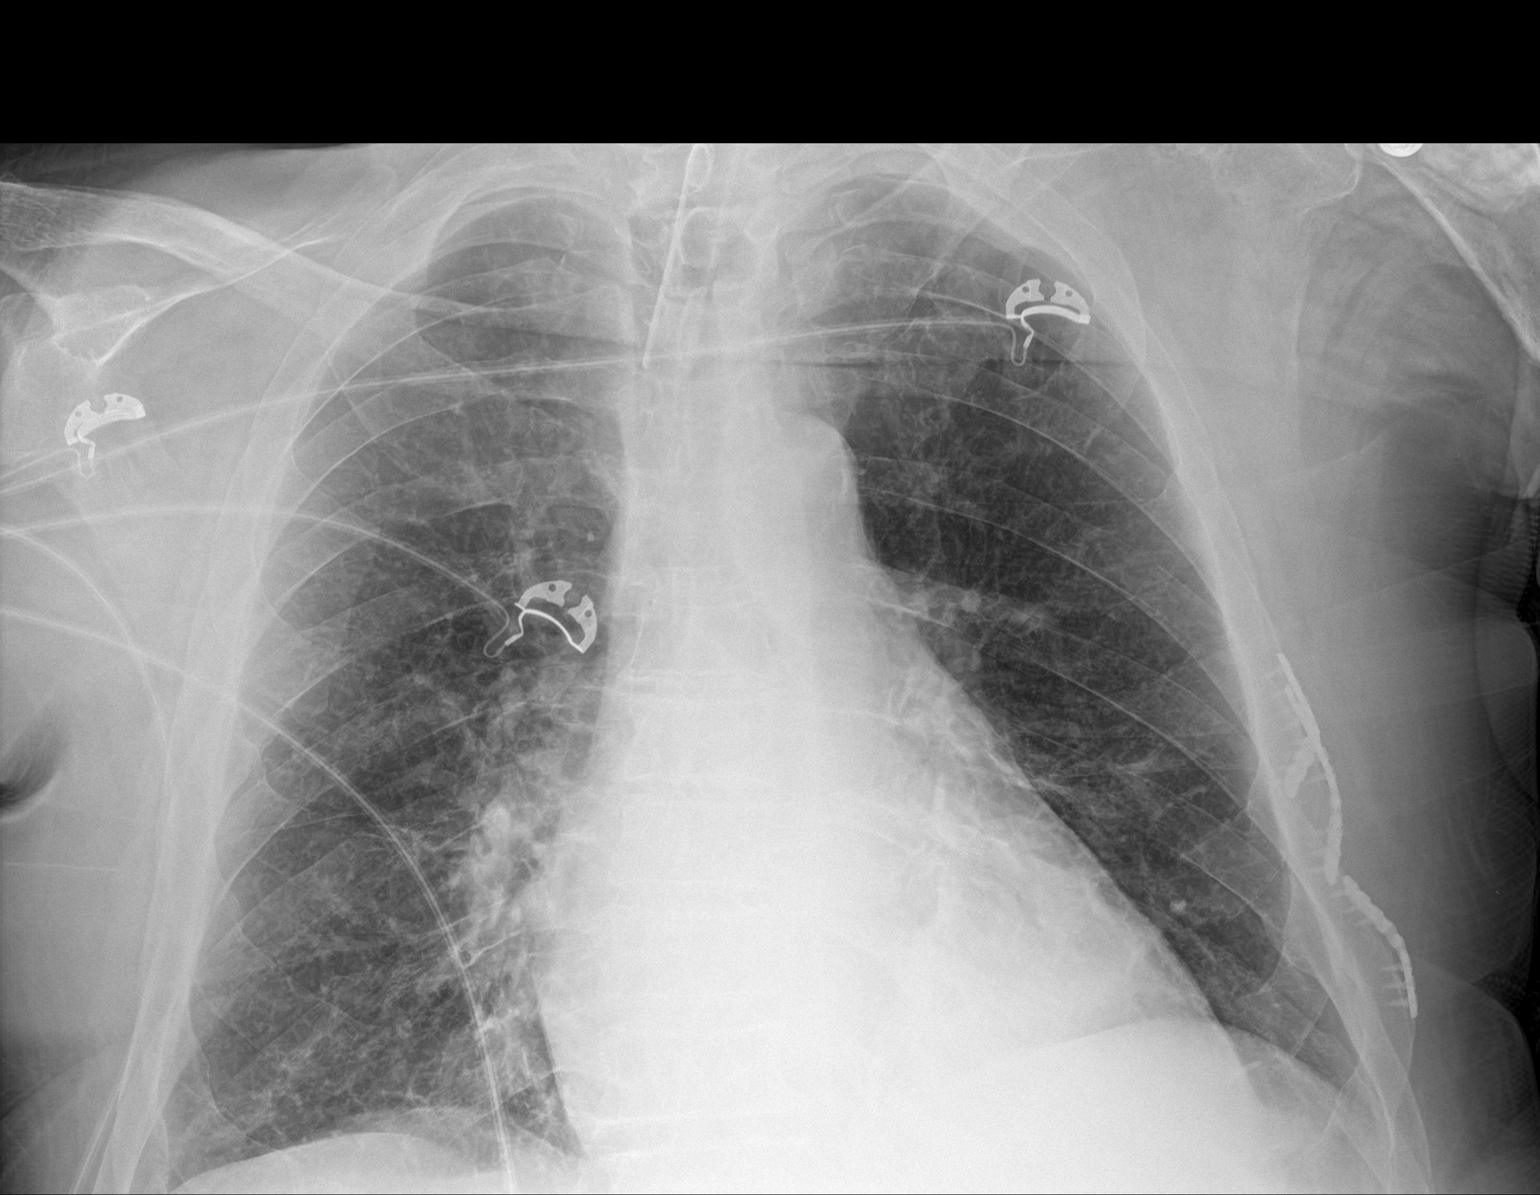

[1 of 1 positions shown; findings below may reference images not displayed]

FINDINGS: Tracheostomy remains in place. The lungs remain clear except for
mild atelectasis at both lung bases. Early pneumonia not excluded.
No effusions. No edema.
IMPRESSION: Persistent mild patchy density both lung bases that could be
atelectasis or mild pneumonia. No worsening or new finding.
